# Patient Record
Sex: Female | Born: 1945
Health system: Southern US, Community
[De-identification: ages and names within clinical notes are randomized; demographics above are authoritative.]

## PROBLEM LIST (undated history)

## (undated) DIAGNOSIS — H269 Unspecified cataract: Secondary | ICD-10-CM

## (undated) DIAGNOSIS — K279 Peptic ulcer, site unspecified, unspecified as acute or chronic, without hemorrhage or perforation: Secondary | ICD-10-CM

## (undated) DIAGNOSIS — H40033 Anatomical narrow angle, bilateral: Secondary | ICD-10-CM

## (undated) DIAGNOSIS — H349 Unspecified retinal vascular occlusion: Secondary | ICD-10-CM

## (undated) DIAGNOSIS — I10 Essential (primary) hypertension: Secondary | ICD-10-CM

## (undated) HISTORY — DX: Anatomical narrow angle, bilateral: H40.033

## (undated) HISTORY — DX: Peptic ulcer, site unspecified, unspecified as acute or chronic, without hemorrhage or perforation: K27.9

## (undated) HISTORY — DX: Unspecified retinal vascular occlusion: H34.9

## (undated) HISTORY — DX: Unspecified cataract: H26.9

## (undated) HISTORY — PX: OTHER SURGICAL HISTORY: SHX169

---

## 2003-12-10 ENCOUNTER — Emergency Department (HOSPITAL_COMMUNITY): Admission: EM | Admit: 2003-12-10 | Discharge: 2003-12-10 | Payer: Self-pay | Admitting: Emergency Medicine

## 2003-12-12 ENCOUNTER — Emergency Department (HOSPITAL_COMMUNITY): Admission: EM | Admit: 2003-12-12 | Discharge: 2003-12-12 | Payer: Self-pay | Admitting: Emergency Medicine

## 2010-10-12 ENCOUNTER — Ambulatory Visit: Payer: BLUE CROSS/BLUE SHIELD | Attending: Internal Medicine | Admitting: Physical Therapy

## 2010-10-12 DIAGNOSIS — M542 Cervicalgia: Secondary | ICD-10-CM | POA: Insufficient documentation

## 2010-10-12 DIAGNOSIS — IMO0001 Reserved for inherently not codable concepts without codable children: Secondary | ICD-10-CM | POA: Insufficient documentation

## 2010-10-12 DIAGNOSIS — R293 Abnormal posture: Secondary | ICD-10-CM | POA: Insufficient documentation

## 2010-10-12 DIAGNOSIS — M2560 Stiffness of unspecified joint, not elsewhere classified: Secondary | ICD-10-CM | POA: Insufficient documentation

## 2010-10-14 ENCOUNTER — Ambulatory Visit: Payer: BLUE CROSS/BLUE SHIELD | Admitting: Physical Therapy

## 2010-10-19 ENCOUNTER — Ambulatory Visit: Payer: BLUE CROSS/BLUE SHIELD | Admitting: Physical Therapy

## 2010-10-21 ENCOUNTER — Ambulatory Visit: Payer: BLUE CROSS/BLUE SHIELD | Admitting: Physical Therapy

## 2010-10-26 ENCOUNTER — Ambulatory Visit: Payer: BLUE CROSS/BLUE SHIELD | Admitting: Physical Therapy

## 2010-10-28 ENCOUNTER — Ambulatory Visit: Payer: Medicare Other | Attending: Internal Medicine | Admitting: Physical Therapy

## 2010-10-28 DIAGNOSIS — M542 Cervicalgia: Secondary | ICD-10-CM | POA: Insufficient documentation

## 2010-10-28 DIAGNOSIS — IMO0001 Reserved for inherently not codable concepts without codable children: Secondary | ICD-10-CM | POA: Insufficient documentation

## 2010-10-28 DIAGNOSIS — M2560 Stiffness of unspecified joint, not elsewhere classified: Secondary | ICD-10-CM | POA: Insufficient documentation

## 2010-10-28 DIAGNOSIS — R293 Abnormal posture: Secondary | ICD-10-CM | POA: Insufficient documentation

## 2013-05-09 DIAGNOSIS — I110 Hypertensive heart disease with heart failure: Secondary | ICD-10-CM | POA: Diagnosis not present

## 2013-05-09 DIAGNOSIS — E7800 Pure hypercholesterolemia, unspecified: Secondary | ICD-10-CM | POA: Diagnosis not present

## 2013-05-14 DIAGNOSIS — I341 Nonrheumatic mitral (valve) prolapse: Secondary | ICD-10-CM | POA: Insufficient documentation

## 2013-06-04 DIAGNOSIS — H40059 Ocular hypertension, unspecified eye: Secondary | ICD-10-CM | POA: Diagnosis not present

## 2013-06-04 DIAGNOSIS — H40039 Anatomical narrow angle, unspecified eye: Secondary | ICD-10-CM | POA: Diagnosis not present

## 2013-10-04 DIAGNOSIS — H40059 Ocular hypertension, unspecified eye: Secondary | ICD-10-CM | POA: Diagnosis not present

## 2013-10-25 DIAGNOSIS — E785 Hyperlipidemia, unspecified: Secondary | ICD-10-CM | POA: Diagnosis not present

## 2013-10-25 DIAGNOSIS — Z79899 Other long term (current) drug therapy: Secondary | ICD-10-CM | POA: Diagnosis not present

## 2013-10-25 DIAGNOSIS — R7309 Other abnormal glucose: Secondary | ICD-10-CM | POA: Diagnosis not present

## 2013-10-30 DIAGNOSIS — F3289 Other specified depressive episodes: Secondary | ICD-10-CM | POA: Diagnosis not present

## 2013-10-30 DIAGNOSIS — I498 Other specified cardiac arrhythmias: Secondary | ICD-10-CM | POA: Diagnosis not present

## 2013-10-30 DIAGNOSIS — Z1212 Encounter for screening for malignant neoplasm of rectum: Secondary | ICD-10-CM | POA: Diagnosis not present

## 2013-10-30 DIAGNOSIS — I059 Rheumatic mitral valve disease, unspecified: Secondary | ICD-10-CM | POA: Diagnosis not present

## 2013-10-30 DIAGNOSIS — K449 Diaphragmatic hernia without obstruction or gangrene: Secondary | ICD-10-CM | POA: Diagnosis not present

## 2013-10-30 DIAGNOSIS — R5381 Other malaise: Secondary | ICD-10-CM | POA: Diagnosis not present

## 2013-10-30 DIAGNOSIS — R079 Chest pain, unspecified: Secondary | ICD-10-CM | POA: Diagnosis not present

## 2013-10-30 DIAGNOSIS — N6459 Other signs and symptoms in breast: Secondary | ICD-10-CM | POA: Diagnosis not present

## 2013-10-30 DIAGNOSIS — K219 Gastro-esophageal reflux disease without esophagitis: Secondary | ICD-10-CM | POA: Diagnosis not present

## 2013-10-30 DIAGNOSIS — I10 Essential (primary) hypertension: Secondary | ICD-10-CM | POA: Diagnosis not present

## 2013-10-30 DIAGNOSIS — R6884 Jaw pain: Secondary | ICD-10-CM | POA: Diagnosis not present

## 2013-10-30 DIAGNOSIS — H409 Unspecified glaucoma: Secondary | ICD-10-CM | POA: Diagnosis not present

## 2013-10-30 DIAGNOSIS — R61 Generalized hyperhidrosis: Secondary | ICD-10-CM | POA: Diagnosis not present

## 2013-10-30 DIAGNOSIS — F329 Major depressive disorder, single episode, unspecified: Secondary | ICD-10-CM | POA: Diagnosis not present

## 2013-10-30 DIAGNOSIS — M5030 Other cervical disc degeneration, unspecified cervical region: Secondary | ICD-10-CM | POA: Diagnosis not present

## 2013-10-30 DIAGNOSIS — Z0000 Encounter for general adult medical examination without abnormal findings: Secondary | ICD-10-CM | POA: Diagnosis not present

## 2013-10-30 DIAGNOSIS — J3089 Other allergic rhinitis: Secondary | ICD-10-CM | POA: Diagnosis not present

## 2013-10-30 DIAGNOSIS — R82998 Other abnormal findings in urine: Secondary | ICD-10-CM | POA: Diagnosis not present

## 2013-10-30 DIAGNOSIS — R319 Hematuria, unspecified: Secondary | ICD-10-CM | POA: Diagnosis not present

## 2013-11-26 DIAGNOSIS — N644 Mastodynia: Secondary | ICD-10-CM | POA: Diagnosis not present

## 2013-11-26 DIAGNOSIS — R920 Mammographic microcalcification found on diagnostic imaging of breast: Secondary | ICD-10-CM | POA: Diagnosis not present

## 2014-01-07 DIAGNOSIS — N814 Uterovaginal prolapse, unspecified: Secondary | ICD-10-CM | POA: Insufficient documentation

## 2014-01-07 DIAGNOSIS — R1031 Right lower quadrant pain: Secondary | ICD-10-CM | POA: Diagnosis not present

## 2014-01-11 DIAGNOSIS — R1031 Right lower quadrant pain: Secondary | ICD-10-CM | POA: Diagnosis not present

## 2014-01-31 DIAGNOSIS — E780 Pure hypercholesterolemia: Secondary | ICD-10-CM | POA: Diagnosis not present

## 2014-01-31 DIAGNOSIS — I119 Hypertensive heart disease without heart failure: Secondary | ICD-10-CM | POA: Diagnosis not present

## 2014-02-07 DIAGNOSIS — H2513 Age-related nuclear cataract, bilateral: Secondary | ICD-10-CM | POA: Diagnosis not present

## 2014-02-07 DIAGNOSIS — H25013 Cortical age-related cataract, bilateral: Secondary | ICD-10-CM | POA: Diagnosis not present

## 2014-02-07 DIAGNOSIS — H40033 Anatomical narrow angle, bilateral: Secondary | ICD-10-CM | POA: Diagnosis not present

## 2014-02-07 DIAGNOSIS — H40053 Ocular hypertension, bilateral: Secondary | ICD-10-CM | POA: Diagnosis not present

## 2014-02-18 DIAGNOSIS — I341 Nonrheumatic mitral (valve) prolapse: Secondary | ICD-10-CM | POA: Diagnosis not present

## 2014-02-18 DIAGNOSIS — R079 Chest pain, unspecified: Secondary | ICD-10-CM | POA: Diagnosis not present

## 2014-02-26 DIAGNOSIS — R0789 Other chest pain: Secondary | ICD-10-CM | POA: Diagnosis not present

## 2014-02-26 DIAGNOSIS — R079 Chest pain, unspecified: Secondary | ICD-10-CM | POA: Diagnosis not present

## 2014-02-26 DIAGNOSIS — R9439 Abnormal result of other cardiovascular function study: Secondary | ICD-10-CM | POA: Diagnosis not present

## 2014-03-12 DIAGNOSIS — R079 Chest pain, unspecified: Secondary | ICD-10-CM | POA: Diagnosis not present

## 2014-04-09 DIAGNOSIS — R079 Chest pain, unspecified: Secondary | ICD-10-CM | POA: Diagnosis not present

## 2014-06-13 DIAGNOSIS — H40053 Ocular hypertension, bilateral: Secondary | ICD-10-CM | POA: Diagnosis not present

## 2014-06-13 DIAGNOSIS — H40033 Anatomical narrow angle, bilateral: Secondary | ICD-10-CM | POA: Diagnosis not present

## 2014-06-21 ENCOUNTER — Encounter (HOSPITAL_COMMUNITY): Payer: Self-pay | Admitting: *Deleted

## 2014-06-21 ENCOUNTER — Emergency Department (HOSPITAL_COMMUNITY)
Admission: EM | Admit: 2014-06-21 | Discharge: 2014-06-21 | Disposition: A | Discharge status: Home / Self Care | Payer: Medicare Other | Attending: Emergency Medicine | Admitting: Emergency Medicine

## 2014-06-21 DIAGNOSIS — I10 Essential (primary) hypertension: Secondary | ICD-10-CM | POA: Insufficient documentation

## 2014-06-21 DIAGNOSIS — Z9889 Other specified postprocedural states: Secondary | ICD-10-CM | POA: Insufficient documentation

## 2014-06-21 DIAGNOSIS — R1013 Epigastric pain: Secondary | ICD-10-CM | POA: Diagnosis not present

## 2014-06-21 DIAGNOSIS — Z720 Tobacco use: Secondary | ICD-10-CM | POA: Diagnosis not present

## 2014-06-21 DIAGNOSIS — M549 Dorsalgia, unspecified: Secondary | ICD-10-CM | POA: Diagnosis not present

## 2014-06-21 HISTORY — DX: Essential (primary) hypertension: I10

## 2014-06-21 LAB — URINALYSIS, ROUTINE W REFLEX MICROSCOPIC
BILIRUBIN URINE: NEGATIVE
GLUCOSE, UA: NEGATIVE mg/dL
HGB URINE DIPSTICK: NEGATIVE
Ketones, ur: NEGATIVE mg/dL
Nitrite: NEGATIVE
PROTEIN: NEGATIVE mg/dL
Specific Gravity, Urine: 1.02 (ref 1.005–1.030)
Urobilinogen, UA: 0.2 mg/dL (ref 0.0–1.0)
pH: 5.5 (ref 5.0–8.0)

## 2014-06-21 LAB — CBC WITH DIFFERENTIAL/PLATELET
Basophils Absolute: 0.1 10*3/uL (ref 0.0–0.1)
Basophils Relative: 0 % (ref 0–1)
Eosinophils Absolute: 0.2 10*3/uL (ref 0.0–0.7)
Eosinophils Relative: 1 % (ref 0–5)
HCT: 39.6 % (ref 36.0–46.0)
Hemoglobin: 13.5 g/dL (ref 12.0–15.0)
Lymphocytes Relative: 24 % (ref 12–46)
Lymphs Abs: 3.4 10*3/uL (ref 0.7–4.0)
MCH: 31.8 pg (ref 26.0–34.0)
MCHC: 34.1 g/dL (ref 30.0–36.0)
MCV: 93.2 fL (ref 78.0–100.0)
Monocytes Absolute: 0.8 10*3/uL (ref 0.1–1.0)
Monocytes Relative: 6 % (ref 3–12)
Neutro Abs: 9.8 10*3/uL — ABNORMAL HIGH (ref 1.7–7.7)
Neutrophils Relative %: 69 % (ref 43–77)
Platelets: 414 10*3/uL — ABNORMAL HIGH (ref 150–400)
RBC: 4.25 MIL/uL (ref 3.87–5.11)
RDW: 13 % (ref 11.5–15.5)
WBC: 14.3 10*3/uL — ABNORMAL HIGH (ref 4.0–10.5)

## 2014-06-21 LAB — COMPREHENSIVE METABOLIC PANEL
ALT: 23 U/L (ref 0–35)
AST: 21 U/L (ref 0–37)
Albumin: 4.2 g/dL (ref 3.5–5.2)
Alkaline Phosphatase: 69 U/L (ref 39–117)
Anion gap: 9 (ref 5–15)
BUN: 14 mg/dL (ref 6–23)
CO2: 23 mmol/L (ref 19–32)
Calcium: 9.9 mg/dL (ref 8.4–10.5)
Chloride: 103 mmol/L (ref 96–112)
Creatinine, Ser: 0.7 mg/dL (ref 0.50–1.10)
GFR calc Af Amer: >90 mL/min (ref >90)
GFR calc non Af Amer: 87 mL/min — ABNORMAL LOW (ref >90)
Glucose, Bld: 112 mg/dL — ABNORMAL HIGH (ref 70–99)
Potassium: 4 mmol/L (ref 3.5–5.1)
Sodium: 135 mmol/L (ref 135–145)
Total Bilirubin: 0.3 mg/dL (ref 0.3–1.2)
Total Protein: 7.6 g/dL (ref 6.0–8.3)

## 2014-06-21 LAB — URINE MICROSCOPIC-ADD ON

## 2014-06-21 LAB — LIPASE, BLOOD: Lipase: 41 U/L (ref 11–59)

## 2014-06-21 MED ORDER — FAMOTIDINE IN NACL 20-0.9 MG/50ML-% IV SOLN
20.0000 mg | Freq: Once | INTRAVENOUS | Status: AC
  Administered 2014-06-21: 20 mg via INTRAVENOUS
  Filled 2014-06-21: qty 50

## 2014-06-21 MED ORDER — PANTOPRAZOLE SODIUM 40 MG PO TBEC
40.0000 mg | DELAYED_RELEASE_TABLET | Freq: Once | ORAL | Status: AC
  Administered 2014-06-21: 40 mg via ORAL
  Filled 2014-06-21: qty 1

## 2014-06-21 MED ORDER — SUCRALFATE 1 G PO TABS: 1.0000 g | ORAL_TABLET | Freq: Three times a day (TID) | ORAL | Status: DC

## 2014-06-21 MED ORDER — GI COCKTAIL ~~LOC~~
30.0000 mL | Freq: Once | ORAL | Status: AC
  Administered 2014-06-21: 30 mL via ORAL
  Filled 2014-06-21: qty 30

## 2014-06-21 NOTE — ED Provider Notes (Signed)
CSN: [id number]     Arrival date & time 06/21/14  1746 History  This chart was scribed for Virgel Manifold, MD by Delphia Grates, ED Scribe. This patient was seen in room APA05/APA05 and the patient's care was started at 6:34 PM.   Chief Complaint  Patient presents with  . Abdominal Pain    The history is provided by the patient. No language interpreter was used.     HPI Comments: Lacey Christensen is a 69 y.o. female who presents to the Emergency Department complaining of constant, waxing and waning, burning, epigastric abdominal pain with radiation to the back and left side since last night. Patient states she was unable to get comfortable last night and reports the pain kept her awake. She reports mild relief after eating oatmeal this morning, but denies any aggravating factors. Patient states she typically takes Nexium for abdominal pain, but this was substituted for omeprazole yesterday. She has also tried Gaviscon. She denies nausea, diarrhea, constipation, or SOB.    Past Medical History  Diagnosis Date  . Hypertension    Past Surgical History  Procedure Laterality Date  . Dilitation and curritage     History reviewed. No pertinent family history. History  Substance Use Topics  . Smoking status: Current Every Day Smoker  . Smokeless tobacco: Not on file  . Alcohol Use: No   OB History    No data available     Review of Systems  Gastrointestinal: Positive for abdominal pain.  Musculoskeletal: Positive for back pain.  All other systems reviewed and are negative.     Allergies  Sulfa antibiotics  Home Medications   Prior to Admission medications   Not on File   Triage Vitals: BP 142/74 mmHg  Pulse 88  Temp(Src) 98.4 F (36.9 C) (Oral)  Resp 15  Ht 5\' 5"  (1.651 m)  Wt 145 lb (65.772 kg)  BMI 24.13 kg/m2  SpO2 98%  Physical Exam  Constitutional: She appears well-developed and well-nourished. No distress.  HENT:  Head: Normocephalic and atraumatic.  Eyes:  Conjunctivae are normal. Right eye exhibits no discharge. Left eye exhibits no discharge.  Neck: Neck supple.  Cardiovascular: Normal rate and regular rhythm.  Exam reveals no gallop and no friction rub.   No murmur heard. Pulmonary/Chest: Effort normal and breath sounds normal. No respiratory distress.  Abdominal: Soft. She exhibits no distension. There is tenderness in the epigastric area. There is no rebound and no guarding.  Mild tenderness in epigastrium.  Musculoskeletal: She exhibits no edema or tenderness.  Neurological: She is alert.  Skin: Skin is warm and dry.  Psychiatric: She has a normal mood and affect. Her behavior is normal. Thought content normal.  Nursing note and vitals reviewed.   ED Course  Procedures (including critical care time)  DIAGNOSTIC STUDIES: Oxygen Saturation is 98% on room air, normal by my interpretation.    COORDINATION OF CARE: At [place] Discussed treatment plan with patient which includes labs. Patient agrees.   Labs Review Labs Reviewed  URINALYSIS, ROUTINE W REFLEX MICROSCOPIC - Abnormal; Notable for the following:    Leukocytes, UA MODERATE (*)    All other components within normal limits  CBC WITH DIFFERENTIAL/PLATELET - Abnormal; Notable for the following:    WBC 14.3 (*)    Platelets 414 (*)    Neutro Abs 9.8 (*)    All other components within normal limits  COMPREHENSIVE METABOLIC PANEL - Abnormal; Notable for the following:    Glucose, Bld 112 (*)  GFR calc non Af Amer 87 (*)    All other components within normal limits  URINE MICROSCOPIC-ADD ON - Abnormal; Notable for the following:    Squamous Epithelial / LPF FEW (*)    Bacteria, UA MANY (*)    All other components within normal limits  LIPASE, BLOOD    Imaging Review No results found.   EKG Interpretation None      MDM   Final diagnoses:  Epigastric pain    68yF with epigastric pain. Minimal tenderness on exam. Possible PUD or gastritis. Low suspicion for more  serious etiology.  I personally preformed the services scribed in my presence. The recorded information has been reviewed is accurate. Virgel Manifold, MD.      Virgel Manifold, MD 06/26/14 [phone]

## 2014-06-21 NOTE — ED Notes (Signed)
Upper abd pain, and back pain, since last night, NO NVD.

## 2014-07-13 ENCOUNTER — Telehealth: Payer: Self-pay | Admitting: Family Medicine

## 2014-07-17 ENCOUNTER — Telehealth: Payer: Self-pay | Admitting: Family Medicine

## 2014-07-19 NOTE — Telephone Encounter (Signed)
Appointment given for 5/5 with stacks.

## 2014-07-19 NOTE — Telephone Encounter (Signed)
Patient has medicare and [employer] [place]. Patient is currently on quinopril, verapamil, tinomol eye drops, alprazolam. Appointment scheduled for May 5th @ 1:55.

## 2014-08-01 ENCOUNTER — Encounter: Payer: Self-pay | Admitting: Family Medicine

## 2014-08-01 ENCOUNTER — Ambulatory Visit (INDEPENDENT_AMBULATORY_CARE_PROVIDER_SITE_OTHER): Payer: Medicare Other | Admitting: Family Medicine

## 2014-08-01 ENCOUNTER — Encounter (INDEPENDENT_AMBULATORY_CARE_PROVIDER_SITE_OTHER): Payer: Self-pay

## 2014-08-01 VITALS — BP 132/76 | HR 79 | Temp 97.8°F | Ht 63.5 in | Wt 145.6 lb

## 2014-08-01 DIAGNOSIS — F411 Generalized anxiety disorder: Secondary | ICD-10-CM

## 2014-08-01 DIAGNOSIS — E785 Hyperlipidemia, unspecified: Secondary | ICD-10-CM

## 2014-08-01 DIAGNOSIS — H409 Unspecified glaucoma: Secondary | ICD-10-CM

## 2014-08-01 DIAGNOSIS — R202 Paresthesia of skin: Secondary | ICD-10-CM

## 2014-08-01 DIAGNOSIS — I10 Essential (primary) hypertension: Secondary | ICD-10-CM

## 2014-08-01 MED ORDER — ALPRAZOLAM 0.25 MG PO TABS: 0.1250 mg | ORAL_TABLET | Freq: Three times a day (TID) | ORAL | Status: DC | PRN

## 2014-08-01 NOTE — Progress Notes (Signed)
Subjective:  Patient ID: Lacey Christensen, female    DOB: 1945-06-20  Age: 69 y.o. MRN: [id number]  CC: Establish Care   HPI Lacey Christensen presents for new patient in today for establishing care here at this office she was a patient of Dr. Earnie Larsson story until he retired earlier this year. The patient has been treated long term for high blood pressure and anxiety. She's had borderline cholesterol elevation as well.   follow-up of hypertension. Patient has no history of headache chest pain or shortness of breath or recent cough. Patient also denies symptoms of TIA such as numbness weakness lateralizing. Patient checks  blood pressure at home and has not had any elevated readings recently. Patient denies side effects from his medication. States taking it regularly.   Patient in for follow-up of elevated cholesterol. Doing well without complaints on current medication. Denies side effects of Krill oil including abdominal discomfort and nausea. Also in today for liver function testing. Currently no chest pain, shortness of breath or other cardiovascular related symptoms noted.  Her past history is negative for heart disease stroke cancer etc.  History O[place]an has a past medical history of Hypertension.   She has past surgical history that includes dilitation and curritage.   Her family history includes Diabetes in her mother; Hypertension in her father and mother.She reports that she has been smoking.  She does not have any smokeless tobacco history on file. She reports that she does not drink alcohol. Her drug history is not on file.  Current Outpatient Prescriptions on File Prior to Visit  Medication Sig Dispense Refill  . Aspirin-Acetaminophen-Caffeine (GOODY HEADACHE PO) Take 1 packet by mouth 4 (four) times daily as needed (for pain).    . Calcium Carbonate-Vitamin D (CALCARB 600/D PO) Take 1 tablet by mouth every evening.    . diphenhydrAMINE (BENADRYL) 25 mg capsule Take 25 mg by mouth every 6  (six) hours as needed for itching or allergies.    Marland Kitchen KRILL OIL PO Take 1 capsule by mouth every evening.    . Multiple Vitamin (MULTIVITAMIN WITH MINERALS) TABS tablet Take 1 tablet by mouth daily.    . quinapril (ACCUPRIL) 40 MG tablet Take 40 mg by mouth every morning.    . timolol (BETIMOL) 0.25 % ophthalmic solution Place 1 drop into both eyes daily.    . verapamil (CALAN-SR) 240 MG CR tablet Take 240 mg by mouth every morning.     No current facility-administered medications on file prior to visit.    ROS Review of Systems  Constitutional: Negative for fever, chills, diaphoresis, appetite change, fatigue and unexpected weight change.  HENT: Negative for congestion, ear pain, hearing loss, postnasal drip, rhinorrhea, sneezing, sore throat and trouble swallowing.   Eyes: Negative for pain.  Respiratory: Negative for cough, chest tightness and shortness of breath.   Cardiovascular: Negative for chest pain and palpitations.  Gastrointestinal: Positive for abdominal pain (3 months ago she was at the emergency room for epigastric pain and vomiting. At that time she resolved most of her symptoms but continued to have some mild indigestion. That seems to fade with 2 near normal but she does continue to take a Nexium tablet hal). Negative for nausea, vomiting, diarrhea and constipation.  Genitourinary: Negative for dysuria, frequency and menstrual problem.  Musculoskeletal: Negative for joint swelling and arthralgias.  Skin: Negative for rash.  Neurological: Negative for dizziness, weakness, numbness and headaches.       Over the last 3 weeks she's noticed  a burning sensation in the tongue periodically mild to moderate but annoying. Not related to food. She has had some minimal tingling in the toes as well.  Psychiatric/Behavioral: Negative for dysphoric mood and agitation.    Objective:  BP 132/76 mmHg  Pulse 79  Temp(Src) 97.8 F (36.6 C) (Oral)  Ht 5' 3.5" (1.613 m)  Wt 145 lb 9.6 oz  (66.044 kg)  BMI 25.38 kg/m2  Physical Exam  Constitutional: She is oriented to person, place, and time. She appears well-developed and well-nourished. No distress.  HENT:  Head: Normocephalic and atraumatic.  Right Ear: External ear normal.  Left Ear: External ear normal.  Nose: Nose normal.  Mouth/Throat: Oropharynx is clear and moist.  Eyes: Conjunctivae and EOM are normal. Pupils are equal, round, and reactive to light.  Neck: Normal range of motion. Neck supple. No thyromegaly present.  Cardiovascular: Normal rate, regular rhythm and normal heart sounds.   No murmur heard. Pulmonary/Chest: Effort normal and breath sounds normal. No respiratory distress. She has no wheezes. She has no rales.  Abdominal: Soft. Bowel sounds are normal. She exhibits no distension. There is no tenderness.  Lymphadenopathy:    She has no cervical adenopathy.  Neurological: She is alert and oriented to person, place, and time. She has normal reflexes.  Skin: Skin is warm and dry.  Psychiatric: She has a normal mood and affect. Her behavior is normal. Judgment and thought content normal.    Assessment & Plan:   Shevelle was seen today for establish care.  Diagnoses and all orders for this visit:  Hyperlipidemia Orders: -     POCT CBC; Standing -     CMP14+EGFR; Standing -     Lipid panel; Standing -     NMR, lipoprofile; Standing -     Thyroid Panel With TSH; Standing -     Vit D  25 hydroxy (rtn osteoporosis monitoring); Standing  Essential hypertension Orders: -     POCT CBC; Standing -     CMP14+EGFR; Standing -     Thyroid Panel With TSH; Standing -     Vit D  25 hydroxy (rtn osteoporosis monitoring); Standing  Generalized anxiety disorder Orders: -     POCT CBC; Standing -     CMP14+EGFR; Standing -     Thyroid Panel With TSH; Standing -     Vit D  25 hydroxy (rtn osteoporosis monitoring); Standing  Glaucoma Comments: narrow angle Orders: -     POCT CBC; Standing -      CMP14+EGFR; Standing -     Thyroid Panel With TSH; Standing -     Vit D  25 hydroxy (rtn osteoporosis monitoring); Standing  Paresthesia Orders: -     Vitamin B12 -     Folate  Other orders -     ALPRAZolam (XANAX) 0.25 MG tablet; Take 0.5-1 tablets (0.125-0.25 mg total) by mouth 3 (three) times daily as needed for anxiety.   I have discontinued Ms. Sloane's sucralfate. I have also changed her ALPRAZolam. Additionally, I am having her maintain her quinapril, verapamil, timolol, KRILL OIL PO, Calcium Carbonate-Vitamin D (CALCARB 600/D PO), multivitamin with minerals, Aspirin-Acetaminophen-Caffeine (GOODY HEADACHE PO), and diphenhydrAMINE.  Meds ordered this encounter  Medications  . ALPRAZolam (XANAX) 0.25 MG tablet    Sig: Take 0.5-1 tablets (0.125-0.25 mg total) by mouth 3 (three) times daily as needed for anxiety.    Dispense:  90 tablet    Refill:  5     Follow-up:  Return in about 6 months (around 02/01/2015).  Claretta Fraise, M.D.

## 2014-08-02 ENCOUNTER — Other Ambulatory Visit: Payer: Medicare Other

## 2014-08-02 DIAGNOSIS — R202 Paresthesia of skin: Secondary | ICD-10-CM | POA: Diagnosis not present

## 2014-08-03 LAB — VITAMIN B12: Vitamin B-12: 374 pg/mL (ref 211–946)

## 2014-08-03 LAB — FOLATE: FOLATE: 19.6 ng/mL (ref >3.0)

## 2014-09-17 ENCOUNTER — Other Ambulatory Visit (INDEPENDENT_AMBULATORY_CARE_PROVIDER_SITE_OTHER): Payer: Medicare Other

## 2014-09-17 DIAGNOSIS — E559 Vitamin D deficiency, unspecified: Secondary | ICD-10-CM | POA: Diagnosis not present

## 2014-09-17 DIAGNOSIS — H409 Unspecified glaucoma: Secondary | ICD-10-CM | POA: Diagnosis not present

## 2014-09-17 DIAGNOSIS — F411 Generalized anxiety disorder: Secondary | ICD-10-CM

## 2014-09-17 DIAGNOSIS — I10 Essential (primary) hypertension: Secondary | ICD-10-CM | POA: Diagnosis not present

## 2014-09-17 DIAGNOSIS — E785 Hyperlipidemia, unspecified: Secondary | ICD-10-CM | POA: Diagnosis not present

## 2014-09-17 LAB — POCT CBC
GRANULOCYTE PERCENT: 70.1 % (ref 37–80)
HCT, POC: 43.4 % (ref 37.7–47.9)
Hemoglobin: 14 g/dL (ref 12.2–16.2)
LYMPH, POC: 2.4 (ref 0.6–3.4)
MCH: 30.3 pg (ref 27–31.2)
MCHC: 32.1 g/dL (ref 31.8–35.4)
MCV: 94.4 fL (ref 80–97)
MPV: 7.2 fL (ref 0–99.8)
POC Granulocyte: 7.6 — AB (ref 2–6.9)
POC LYMPH %: 22.6 % (ref 10–50)
Platelet Count, POC: 448 10*3/uL — AB (ref 142–424)
RBC: 4.6 M/uL (ref 4.04–5.48)
RDW, POC: 13.4 %
WBC: 10.8 10*3/uL — AB (ref 4.6–10.2)

## 2014-09-17 NOTE — Progress Notes (Signed)
Lab only 

## 2014-09-18 LAB — THYROID PANEL WITH TSH
FREE THYROXINE INDEX: 1.4 (ref 1.2–4.9)
T3 Uptake Ratio: 22 % — ABNORMAL LOW (ref 24–39)
T4 TOTAL: 6.5 ug/dL (ref 4.5–12.0)
TSH: 3.77 u[IU]/mL (ref 0.450–4.500)

## 2014-09-18 LAB — NMR, LIPOPROFILE
CHOLESTEROL: 194 mg/dL (ref 100–199)
HDL CHOLESTEROL BY NMR: 35 mg/dL — AB (ref >39)
HDL PARTICLE NUMBER: 29.5 umol/L — AB (ref >=30.5)
LDL Particle Number: 1434 nmol/L — ABNORMAL HIGH (ref <1000)
LDL Size: 19.8 nm (ref >20.5)
LDL-C: 98 mg/dL (ref 0–99)
LP-IR Score: 71 — ABNORMAL HIGH (ref <=45)
SMALL LDL PARTICLE NUMBER: 1096 nmol/L — AB (ref <=527)
TRIGLYCERIDES BY NMR: 307 mg/dL — AB (ref 0–149)

## 2014-09-18 LAB — CMP14+EGFR
A/G RATIO: 1.6 (ref 1.1–2.5)
ALK PHOS: 76 IU/L (ref 39–117)
ALT: 22 IU/L (ref 0–32)
AST: 22 IU/L (ref 0–40)
Albumin: 4.4 g/dL (ref 3.6–4.8)
BUN/Creatinine Ratio: 18 (ref 11–26)
BUN: 12 mg/dL (ref 8–27)
CHLORIDE: 104 mmol/L (ref 97–108)
CO2: 22 mmol/L (ref 18–29)
Calcium: 10.3 mg/dL (ref 8.7–10.3)
Creatinine, Ser: 0.65 mg/dL (ref 0.57–1.00)
GFR calc non Af Amer: 92 mL/min/{1.73_m2} (ref >59)
GFR, EST AFRICAN AMERICAN: 105 mL/min/{1.73_m2} (ref >59)
GLUCOSE: 94 mg/dL (ref 65–99)
Globulin, Total: 2.7 g/dL (ref 1.5–4.5)
POTASSIUM: 5.4 mmol/L — AB (ref 3.5–5.2)
SODIUM: 145 mmol/L — AB (ref 134–144)
TOTAL PROTEIN: 7.1 g/dL (ref 6.0–8.5)

## 2014-09-18 LAB — VITAMIN D 25 HYDROXY (VIT D DEFICIENCY, FRACTURES): Vit D, 25-Hydroxy: 30.9 ng/mL (ref 30.0–100.0)

## 2014-09-23 ENCOUNTER — Emergency Department (HOSPITAL_COMMUNITY)
Admission: EM | Admit: 2014-09-23 | Discharge: 2014-09-23 | Disposition: A | Discharge status: Home / Self Care | Payer: Medicare Other | Attending: Emergency Medicine | Admitting: Emergency Medicine

## 2014-09-23 ENCOUNTER — Emergency Department (HOSPITAL_COMMUNITY): Payer: Medicare Other

## 2014-09-23 ENCOUNTER — Encounter (HOSPITAL_COMMUNITY): Payer: Self-pay | Admitting: Emergency Medicine

## 2014-09-23 ENCOUNTER — Telehealth: Payer: Self-pay | Admitting: Gastroenterology

## 2014-09-23 DIAGNOSIS — R1013 Epigastric pain: Secondary | ICD-10-CM | POA: Diagnosis present

## 2014-09-23 DIAGNOSIS — K2980 Duodenitis without bleeding: Secondary | ICD-10-CM | POA: Diagnosis not present

## 2014-09-23 DIAGNOSIS — I10 Essential (primary) hypertension: Secondary | ICD-10-CM | POA: Insufficient documentation

## 2014-09-23 DIAGNOSIS — R109 Unspecified abdominal pain: Secondary | ICD-10-CM

## 2014-09-23 DIAGNOSIS — K3189 Other diseases of stomach and duodenum: Secondary | ICD-10-CM | POA: Diagnosis not present

## 2014-09-23 DIAGNOSIS — Z79899 Other long term (current) drug therapy: Secondary | ICD-10-CM | POA: Insufficient documentation

## 2014-09-23 DIAGNOSIS — N200 Calculus of kidney: Secondary | ICD-10-CM | POA: Diagnosis not present

## 2014-09-23 DIAGNOSIS — R197 Diarrhea, unspecified: Secondary | ICD-10-CM | POA: Insufficient documentation

## 2014-09-23 DIAGNOSIS — Z720 Tobacco use: Secondary | ICD-10-CM | POA: Diagnosis not present

## 2014-09-23 LAB — COMPREHENSIVE METABOLIC PANEL
ALT: 19 U/L (ref 14–54)
ANION GAP: 9 (ref 5–15)
AST: 18 U/L (ref 15–41)
Albumin: 4.1 g/dL (ref 3.5–5.0)
Alkaline Phosphatase: 66 U/L (ref 38–126)
BUN: 11 mg/dL (ref 6–20)
CALCIUM: 9.8 mg/dL (ref 8.9–10.3)
CO2: 27 mmol/L (ref 22–32)
CREATININE: 0.76 mg/dL (ref 0.44–1.00)
Chloride: 103 mmol/L (ref 101–111)
GLUCOSE: 115 mg/dL — AB (ref 65–99)
Potassium: 4.7 mmol/L (ref 3.5–5.1)
Sodium: 139 mmol/L (ref 135–145)
TOTAL PROTEIN: 7.8 g/dL (ref 6.5–8.1)
Total Bilirubin: 0.5 mg/dL (ref 0.3–1.2)

## 2014-09-23 LAB — CBC WITH DIFFERENTIAL/PLATELET
BASOS ABS: 0.1 10*3/uL (ref 0.0–0.1)
Basophils Relative: 1 % (ref 0–1)
EOS ABS: 0.3 10*3/uL (ref 0.0–0.7)
Eosinophils Relative: 2 % (ref 0–5)
HCT: 40.4 % (ref 36.0–46.0)
Hemoglobin: 13.5 g/dL (ref 12.0–15.0)
LYMPHS ABS: 2.5 10*3/uL (ref 0.7–4.0)
LYMPHS PCT: 18 % (ref 12–46)
MCH: 31.6 pg (ref 26.0–34.0)
MCHC: 33.4 g/dL (ref 30.0–36.0)
MCV: 94.6 fL (ref 78.0–100.0)
Monocytes Absolute: 0.9 10*3/uL (ref 0.1–1.0)
Monocytes Relative: 7 % (ref 3–12)
NEUTROS PCT: 72 % (ref 43–77)
Neutro Abs: 10 10*3/uL — ABNORMAL HIGH (ref 1.7–7.7)
PLATELETS: 438 10*3/uL — AB (ref 150–400)
RBC: 4.27 MIL/uL (ref 3.87–5.11)
RDW: 12.9 % (ref 11.5–15.5)
WBC: 13.7 10*3/uL — AB (ref 4.0–10.5)

## 2014-09-23 LAB — URINALYSIS, ROUTINE W REFLEX MICROSCOPIC
BILIRUBIN URINE: NEGATIVE
Glucose, UA: NEGATIVE mg/dL
HGB URINE DIPSTICK: NEGATIVE
KETONES UR: NEGATIVE mg/dL
NITRITE: NEGATIVE
Protein, ur: NEGATIVE mg/dL
SPECIFIC GRAVITY, URINE: 1.01 (ref 1.005–1.030)
UROBILINOGEN UA: 0.2 mg/dL (ref 0.0–1.0)
pH: 7 (ref 5.0–8.0)

## 2014-09-23 LAB — LIPASE, BLOOD: LIPASE: 48 U/L (ref 22–51)

## 2014-09-23 LAB — POC OCCULT BLOOD, ED: FECAL OCCULT BLD: POSITIVE — AB

## 2014-09-23 LAB — URINE MICROSCOPIC-ADD ON

## 2014-09-23 MED ORDER — ONDANSETRON HCL 4 MG/2ML IJ SOLN
4.0000 mg | Freq: Once | INTRAMUSCULAR | Status: AC
  Administered 2014-09-23: 4 mg via INTRAVENOUS
  Filled 2014-09-23: qty 2

## 2014-09-23 MED ORDER — IOHEXOL 300 MG/ML  SOLN
100.0000 mL | Freq: Once | INTRAMUSCULAR | Status: AC | PRN
  Administered 2014-09-23: 100 mL via INTRAVENOUS

## 2014-09-23 MED ORDER — SUCRALFATE 1 GM/10ML PO SUSP: 1.0000 g | Freq: Three times a day (TID) | ORAL | Status: DC

## 2014-09-23 MED ORDER — MORPHINE SULFATE 4 MG/ML IJ SOLN
4.0000 mg | Freq: Once | INTRAMUSCULAR | Status: AC
  Administered 2014-09-23: 4 mg via INTRAVENOUS
  Filled 2014-09-23: qty 1

## 2014-09-23 MED ORDER — IOHEXOL 300 MG/ML  SOLN
25.0000 mL | Freq: Once | INTRAMUSCULAR | Status: AC | PRN
  Administered 2014-09-23: 25 mL via ORAL

## 2014-09-23 MED ORDER — FAMOTIDINE IN NACL 20-0.9 MG/50ML-% IV SOLN: 20.0000 mg | Freq: Once | INTRAVENOUS | Status: DC

## 2014-09-23 MED ORDER — ESOMEPRAZOLE MAGNESIUM 40 MG PO CPDR: 40.0000 mg | DELAYED_RELEASE_CAPSULE | Freq: Two times a day (BID) | ORAL | Status: DC

## 2014-09-23 MED ORDER — ONDANSETRON HCL 4 MG/2ML IJ SOLN: 4.0000 mg | Freq: Once | INTRAMUSCULAR | Status: DC

## 2014-09-23 MED ORDER — FAMOTIDINE 20 MG PO TABS
20.0000 mg | ORAL_TABLET | Freq: Once | ORAL | Status: AC
  Administered 2014-09-23: 20 mg via ORAL
  Filled 2014-09-23: qty 1

## 2014-09-23 MED ORDER — PANTOPRAZOLE SODIUM 40 MG IV SOLR
40.0000 mg | Freq: Once | INTRAVENOUS | Status: AC
  Administered 2014-09-23: 40 mg via INTRAVENOUS
  Filled 2014-09-23: qty 40

## 2014-09-23 NOTE — ED Notes (Signed)
Pt alert & oriented x4, stable gait. Patient given discharge instructions, paperwork & prescription(s). Patient  instructed to stop at the registration desk to finish any additional paperwork. Patient verbalized understanding. Pt left department w/ no further questions. 

## 2014-09-23 NOTE — ED Notes (Addendum)
Pt reports generalized abdominal pain since Friday. Pt reports nausea,urinary frequency, and diarrhea since yesterday.

## 2014-09-23 NOTE — Telephone Encounter (Signed)
PLEASE CALL PT. CONTACT PT. SHE NEEDS OPV WITHIN  THE NEXT 2-3 WEEKS. SHE SHOULD AVOID ASA/NSAIDS, SPECIFICALLY GOODY POWDERS FOR 2 WEEKS. CONTINUE NEXIUM BID.

## 2014-09-23 NOTE — Discharge Instructions (Signed)
Duodenitis Duodenitis is inflammation of the lining of the first part of your small intestine (duodenum). There are two types of duodenitis:  Acute duodenitis (develops suddenly and is short lived).   Chronic duodenitis (develops over an extended period and lasts months to years). CAUSES  Duodenitis is most often caused by infection with the bacterium Helicobacter pylori (H. pylori). H. pylori increases the production of stomach acid and causes changes in the environment of the duodenum. This irritates and damages the cells of the duodenum causing inflammation. Other causes of duodenitis include:   Long-term use of nonsteroidal anti-inflammatory drugs (NSAIDs). NSAIDs change the lining of the duodenum and make it more prone to injury from stomach acid.  Excessive use of alcohol. Alcohol increases stomach acid and changes the lining of the duodenum which makes it more likely for inflammation to develop.  Giardiasis. Giardiasis is a common infection of the small intestine. It can cause inflammation of the duodenum.   Other gastrointestinal disorders, such as Crohn disease. People with these disorders are more likely to develop duodenitis. SYMPTOMS  Although duodenitis does not always cause symptoms, symptoms that do occur include:  Nausea or vomiting.  Gassy, bloated feeling or an uncomfortable feeling of fullness after eating.  Burning, cramps, or pain in the upper abdominal area. DIAGNOSIS  To diagnose duodenitis, your health care provider may use results from:   An exam of the duodenum using a thin tube with a tiny camera on the tip, which is placed down your throat (endoscope). The endoscope is passed through your stomach and into your duodenum. Sometimes a sample of tissue from your duodenum is removed with the endoscope. The sample is then examined under a microscope (biopsy) for signs of inflammation and H. pylori infection.   Tests that check samples of your blood or stool for  H. pylori infection.   A test that checks the gases in a sample of your expired breath for H. pylori infection. The test measures the levels of carbon dioxide in your breath after you drink a special solution.  An X-ray exam using a special liquid that you swallow to illuminate your digestive tract (barium) to show signs of inflammation. TREATMENT  Treatment will depend on the cause of the duodenitis. The most common treatments include:  Use of medication to treat infection.  Medication to reduce stomach acid.  Discontinuing the use of NSAIDs.  Management of other gastrointestinal conditions.  Avoiding alcohol consumption. Additionally, taking the following steps can help to reduce the severity of your symptoms:  Drink enough water to keep your urine clear or pale yellow.  Avoid consuming these foods or drinks:  Caffeinated drinks.  Chocolate.  Peppermint or mint-flavored food or drinks.  Garlic.  Onions.  Spicy foods.  Citrus fruits, such as oranges, lemons, or limes.  Foods that use tomato-based sauces, such as pasta sauce, chili, salsa, and pizza.  Fatty foods.  Fried foods. Document Released: 07/10/2012 Document Revised: 07/30/2013 Document Reviewed: 07/10/2012 [facility] Patient Information 2015 [place], [place]. This information is not intended to replace advice given to you by your health care provider. Make sure you discuss any questions you have with your health care provider.   Take the medicines as prescribed and do not take any more BC or Goody Powders.  The only safe medicine for pain relief to avoid stomach ulcer complications is tylenol.

## 2014-09-23 NOTE — ED Notes (Signed)
Patient is resting comfortably. 

## 2014-09-23 NOTE — ED Provider Notes (Signed)
CSN: [id number]     Arrival date & time 09/23/14  9518 History   First MD Initiated Contact with Patient 09/23/14 0845     Chief Complaint  Patient presents with  . Abdominal Pain     (Consider location/radiation/quality/duration/timing/severity/associated sxs/prior Treatment) The history is provided by the patient and the spouse.   Lacey Christensen is a 70 y.o. female presenting with a history of hypertension and peptic ulcer disease which was treated 13 years ago presenting with a 3 day history of epigastric pain which has been constant, nausea without emesis and several episodes of watery nonbloody diarrhea which started yesterday.  She's had no fevers or chills.  She also denies weakness or lightheadedness.  Her pain is constant and is briefly relieved by Mylanta.  She has had reduced appetite and states sometimes eating makes it better sometimes it makes it worse.  Her husband at the bedside as that she takes at least 4 BC powders daily for chronic arthritis pain.    Past Medical History  Diagnosis Date  . Hypertension    Past Surgical History  Procedure Laterality Date  . Dilitation and curritage     Family History  Problem Relation Age of Onset  . Diabetes Mother   . Hypertension Mother   . Hypertension Father    History  Substance Use Topics  . Smoking status: Current Every Day Smoker  . Smokeless tobacco: Not on file  . Alcohol Use: No   OB History    No data available     Review of Systems  Constitutional: Negative for fever and chills.  HENT: Negative for congestion and sore throat.   Eyes: Negative.   Respiratory: Negative for chest tightness and shortness of breath.   Cardiovascular: Negative for chest pain.  Gastrointestinal: Positive for nausea, abdominal pain and diarrhea. Negative for vomiting, constipation and blood in stool.  Genitourinary: Negative.  Negative for dysuria.  Musculoskeletal: Negative for joint swelling, arthralgias and neck pain.  Skin:  Negative.  Negative for rash and wound.  Neurological: Negative for dizziness, weakness, light-headedness, numbness and headaches.  Psychiatric/Behavioral: Negative.       Allergies  Corn-containing products; Sulfa antibiotics; and Tape  Home Medications   Prior to Admission medications   Medication Sig Start Date End Date Taking? Authorizing Provider  ALPRAZolam (XANAX) 0.25 MG tablet Take 0.5-1 tablets (0.125-0.25 mg total) by mouth 3 (three) times daily as needed for anxiety. 08/01/14  Yes Claretta Fraise, MD  Aspirin-Acetaminophen-Caffeine (GOODY HEADACHE PO) Take 1 packet by mouth 4 (four) times daily as needed (for pain).   Yes Historical Provider, MD  Calcium Carbonate-Vitamin D (CALCARB 600/D PO) Take 1 tablet by mouth every evening.   Yes Historical Provider, MD  diphenhydrAMINE (BENADRYL) 25 mg capsule Take 25 mg by mouth every 6 (six) hours as needed for itching or allergies.   Yes Historical Provider, MD  KRILL OIL PO Take 1 capsule by mouth every evening.   Yes Historical Provider, MD  Multiple Vitamin (MULTIVITAMIN WITH MINERALS) TABS tablet Take 1 tablet by mouth daily.   Yes Historical Provider, MD  quinapril (ACCUPRIL) 40 MG tablet Take 40 mg by mouth every morning.   Yes Historical Provider, MD  timolol (BETIMOL) 0.25 % ophthalmic solution Place 1 drop into both eyes daily.   Yes Historical Provider, MD  verapamil (CALAN-SR) 240 MG CR tablet Take 240 mg by mouth every morning.   Yes Historical Provider, MD  esomeprazole (NEXIUM) 40 MG capsule Take 1 capsule (  40 mg total) by mouth 2 (two) times daily before a meal. 09/23/14   Evalee Jefferson, PA-C  sucralfate (CARAFATE) 1 GM/10ML suspension Take 10 mLs (1 g total) by mouth 4 (four) times daily -  with meals and at bedtime. 09/23/14   Evalee Jefferson, PA-C   BP 121/64 mmHg  Pulse 71  Temp(Src) 98.3 F (36.8 C) (Oral)  Resp 17  Ht 5\' 4"  (1.626 m)  Wt 145 lb (65.772 kg)  BMI 24.88 kg/m2  SpO2 97% Physical Exam  Constitutional: She  appears well-developed and well-nourished.  HENT:  Head: Normocephalic and atraumatic.  Eyes: Conjunctivae are normal.  Neck: Normal range of motion.  Cardiovascular: Normal rate, regular rhythm, normal heart sounds and intact distal pulses.   Pulmonary/Chest: Effort normal and breath sounds normal. She has no wheezes.  Abdominal: Soft. Bowel sounds are normal. There is tenderness in the epigastric area. There is no rigidity, no guarding, no CVA tenderness and negative Murphy's sign.  Musculoskeletal: Normal range of motion.  Neurological: She is alert.  Skin: Skin is warm and dry.  Psychiatric: She has a normal mood and affect.  Nursing note and vitals reviewed.   ED Course  Procedures (including critical care time) Labs Review Labs Reviewed  COMPREHENSIVE METABOLIC PANEL - Abnormal; Notable for the following:    Glucose, Bld 115 (*)    All other components within normal limits  CBC WITH DIFFERENTIAL/PLATELET - Abnormal; Notable for the following:    WBC 13.7 (*)    Platelets 438 (*)    Neutro Abs 10.0 (*)    All other components within normal limits  URINALYSIS, ROUTINE W REFLEX MICROSCOPIC (NOT AT [facility]) - Abnormal; Notable for the following:    Leukocytes, UA SMALL (*)    All other components within normal limits  URINE MICROSCOPIC-ADD ON - Abnormal; Notable for the following:    Squamous Epithelial / LPF FEW (*)    Bacteria, UA FEW (*)    All other components within normal limits  POC OCCULT BLOOD, ED - Abnormal; Notable for the following:    Fecal Occult Bld POSITIVE (*)    All other components within normal limits  LIPASE, BLOOD    Imaging Review Ct Abdomen Pelvis W Contrast  09/23/2014   ADDENDUM REPORT: 09/23/2014 12:46  ADDENDUM: I discussed these findings by telephone with Evalee Jefferson at approximately 12:40 p.m. on 09/23/2014.   Electronically Signed   By: Misty Stanley M.D.   On: 09/23/2014 12:46   09/23/2014   CLINICAL DATA:  Initial encounter for generalized  abdominal pain since Friday with nausea and diarrhea since 09/21/2014.  EXAM: CT ABDOMEN AND PELVIS WITH CONTRAST  TECHNIQUE: Multidetector CT imaging of the abdomen and pelvis was performed using the standard protocol following bolus administration of intravenous contrast.  CONTRAST:  24mL OMNIPAQUE IOHEXOL 300 MG/ML SOLN, 141mL OMNIPAQUE IOHEXOL 300 MG/ML SOLN  COMPARISON:  None.  FINDINGS: Lower chest:  Unremarkable.  Hepatobiliary: Insert normal Liver tiny layering stones are seen in the gallbladder fundus. No intrahepatic or extrahepatic biliary dilation.  Pancreas: No focal mass lesion. No dilatation of the main duct. No intraparenchymal cyst. No peripancreatic edema.  Spleen: No splenomegaly. No focal mass lesion.  Adrenals/Urinary Tract: No adrenal nodule or mass. Vascular calcifications seen in the hilum of the left kidney. No hydronephrosis on either side. 10 mm hypo attenuating lesion in the right kidney has density too high to allow classification as a simple cyst. 2.0 cm exophytic lesion in the interpolar  left kidney is likely a cyst. There is no hydronephrosis. Urinary bladder is normal in appearance.  Stomach/Bowel: Stomach is nondistended. No gastric wall thickening. No evidence of outlet obstruction.  Irregular wall thickening is seen in the second portion of the duodenum. There is a small out pouching of contrast which may be a duodenal diverticulum. There is associated periduodenal edema/ inflammation. Multiple small lymph nodes are seen in the retroperitoneal tissues around the descending duodenum. No evidence for retroperitoneal abscess.  Vascular/Lymphatic: No small bowel wall thickening. No small bowel dilatation. Terminal ileum is normal. Appendix is normal. No gross colonic mass. No colonic wall thickening. No substantial diverticular change.  Reproductive: Uterus is markedly atrophic, or potentially surgically absent. There is no adnexal mass.  Other: No intraperitoneal free fluid.   Musculoskeletal: Bone windows reveal no worrisome lytic or sclerotic osseous lesions.  IMPRESSION: Subtle irregular wall thickening in the duodenum with periduodenal edema/ inflammation. Small apparent outpouching could be a duodenum diverticulum. As such, these changes may be related to duodenitis or duodenum diverticulitis. Alternative considerations include peptic ulcer disease and even potentially perforated peptic ulcer (given the outpouching of contrast), although this is considered less likely. There is no evidence for edema within the pancreatic head or uncinate process to suggest these changes are related to focal pancreatitis in the region of the pancreatic head. No features to suggest acute cholecystitis although gallstones are evident.  Electronically Signed: By: Misty Stanley M.D. On: 09/23/2014 12:33   Dg Abd Acute W/chest  09/23/2014   CLINICAL DATA:  Generalized abdominal pain since Friday, nausea, urinary frequency and diarrhea since yesterday, smoker  EXAM: DG ABDOMEN ACUTE W/ 1V CHEST  COMPARISON:  None  FINDINGS: Normal heart size, mediastinal contours and pulmonary vascularity.  Atherosclerotic calcification of thoracic and abdominal aorta.  Lungs appear emphysematous but clear.  No pleural effusion or pneumothorax.  Normal bowel gas pattern.  No bowel dilatation, bowel wall thickening or free intraperitoneal air.  Nonobstructing calculus projects over LEFT renal pelvis, 3 mm diameter.  Bones diffusely demineralized with minimal thoracolumbar scoliosis.  IMPRESSION:  Emphysematous changes without infiltrate.  3 mm nonobstructing LEFT renal calculus.  Normal bowel gas pattern.   Electronically Signed   By: Lavonia Dana M.D.   On: 09/23/2014 10:38     EKG Interpretation None      MDM   Final diagnoses:  Abdominal pain  Duodenitis    Patients labs and/or radiological studies were reviewed and considered during the medical decision making and disposition process.  Results were also  discussed with patient. Pt was also seen by Dr. Roderic Palau during this visit.  Pt with duodenitis, probably secondary to her chronic nsaid use.  She was advised she needs close f/u with GI, referral given, or may choose to f/u with her prior GI specialist in [place].  Advised absolutely no nsaids, bc, goody's.  Only safe pain reliever at this time is tylenol.  She was prescribed nexium, bid use.  Has been taking prn, stating last dose over 1 week ago.  She was also prescribed carafate.  Advised return here for any worsened sx.  Re-exam at time of dc, pt is sx free, abdomen soft, no guarding.  Exam not c/w perforated PUD.      Evalee Jefferson, PA-C 09/24/14 [place], MD 09/24/14 [phone]

## 2014-09-24 ENCOUNTER — Ambulatory Visit: Payer: Medicare Other | Admitting: Family Medicine

## 2014-09-25 NOTE — Telephone Encounter (Signed)
I scheduled pt with Randall Hiss 10/16/14, pt states she may just continue to see her GI doctor in [place] she is going to talk with her husband when he gets home, pt is going to stay on schedule and call us back if she wants to cancel this appt.

## 2014-10-16 ENCOUNTER — Encounter: Payer: Self-pay | Admitting: Nurse Practitioner

## 2014-10-16 ENCOUNTER — Ambulatory Visit (INDEPENDENT_AMBULATORY_CARE_PROVIDER_SITE_OTHER): Payer: Medicare Other | Admitting: Nurse Practitioner

## 2014-10-16 VITALS — BP 118/67 | HR 86 | Temp 97.6°F | Ht 64.0 in | Wt 137.0 lb

## 2014-10-16 DIAGNOSIS — K625 Hemorrhage of anus and rectum: Secondary | ICD-10-CM

## 2014-10-16 DIAGNOSIS — Z8711 Personal history of peptic ulcer disease: Secondary | ICD-10-CM

## 2014-10-16 DIAGNOSIS — R1013 Epigastric pain: Secondary | ICD-10-CM

## 2014-10-16 DIAGNOSIS — R197 Diarrhea, unspecified: Secondary | ICD-10-CM | POA: Diagnosis not present

## 2014-10-16 NOTE — Patient Instructions (Signed)
1. We will have he signed a records release before he leaves today so we can obtain your last endoscopy report from [place] [facility] in [facility]. 2. Recommend an endoscopy as well as a colonoscopy given your symptoms and history. Please call let us know if you have like that done here or if she would prefer to return to [facility] endoscopy center in [facility]. If you return to [facility], we would be happy to provide records needed.

## 2014-10-16 NOTE — Progress Notes (Signed)
Primary Care Physician:  Claretta Fraise, MD Primary Gastroenterologist:  Dr. Oneida Alar  Chief Complaint  Patient presents with  . Follow-up  . Abdominal Pain    HPI:   69 year old female presents for evaluation on emergency room follow-up visit. Emergency room note reviewed. The patient was seen in the emergency room on 09/23/2014 for 3 day history of epigastric pain which is constant in the setting of a history of peptic ulcer disease. The pain at that time was briefly relieved by Mylanta and admitted to taking 4 BC powders daily for chronic arthritis pain. It was determined she likely has duodenitis secondary to NSAID use, was referred to GI and advised to stop all NSAIDs including ASA powders. Her Nexium was changed to twice a day. No endoscopy or colonoscopy found in our system.  Today she states she has a history of peptic ulcer disease "a very large ulcer" 12 years ago diagnosed on EGD at [facility] in [place]. Was placed on Nexium which she took for a time and stopped. No further symptoms until this year. In 05/2014 she was seen in the ER for epigastric pain and started on Nexium and Carafate. Was doing well until end of June when she was again seen in the ER as per above. Since her ER visit has only had 2 Goody powders. Tylenol not effective. One week after her visit she has some residual pain and since then has not had any more pain. 2 weeks ago she had an episode of diarrhea which has some blood in it but this cleared up after a few bowel movements and is having normal bowel movements now. Was also having dark diarrhea bowel movments when in the ER. She had a normal bowel movement this morning. Last colonoscopy about 8 years ago. Denies other episodes of hematochezia and melena. Typically she has a daily bowel movement which is consistent with Bristol Scale 4. Denies N/V, recent fever/chills. Has had unitntentional weight loss of about 9 pounts in the past month but states  she hasn't been eating as much. Denies chest pain, dyspnea, dizziness, lightheadedness, syncope, near syncope. Denies any other upper or lower GI symptoms.  Past Medical History  Diagnosis Date  . Hypertension     Past Surgical History  Procedure Laterality Date  . Dilitation and curritage      Current Outpatient Prescriptions  Medication Sig Dispense Refill  . ALPRAZolam (XANAX) 0.25 MG tablet Take 0.5-1 tablets (0.125-0.25 mg total) by mouth 3 (three) times daily as needed for anxiety. 90 tablet 5  . Calcium Carbonate-Vitamin D (CALCARB 600/D PO) Take 1 tablet by mouth every evening.    . diphenhydrAMINE (BENADRYL) 25 mg capsule Take 25 mg by mouth every 6 (six) hours as needed for itching or allergies.    Marland Kitchen esomeprazole (NEXIUM) 40 MG capsule Take 1 capsule (40 mg total) by mouth 2 (two) times daily before a meal. 60 capsule 0  . KRILL OIL PO Take 1 capsule by mouth every evening.    . Multiple Vitamin (MULTIVITAMIN WITH MINERALS) TABS tablet Take 1 tablet by mouth daily.    . quinapril (ACCUPRIL) 40 MG tablet Take 40 mg by mouth every morning.    . sucralfate (CARAFATE) 1 GM/10ML suspension Take 10 mLs (1 g total) by mouth 4 (four) times daily -  with meals and at bedtime. 420 mL 0  . timolol (BETIMOL) 0.25 % ophthalmic solution Place 1 drop into both eyes daily.    . verapamil (  CALAN-SR) 240 MG CR tablet Take 240 mg by mouth every morning.    . Aspirin-Acetaminophen-Caffeine (GOODY HEADACHE PO) Take 1 packet by mouth 4 (four) times daily as needed (for pain).     No current facility-administered medications for this visit.    Allergies as of 10/16/2014 - Review Complete 10/16/2014  Allergen Reaction Noted  . Corn-containing products Itching 08/01/2014  . Sulfa antibiotics Other (See Comments) 06/21/2014  . Tape Rash 06/21/2014    Family History  Problem Relation Age of Onset  . Diabetes Mother   . Hypertension Mother   . Hypertension Father     History   Social  History  . Marital Status: Married    Spouse Name: N/A  . Number of Children: N/A  . Years of Education: N/A   Occupational History  . Not on file.   Social History Main Topics  . Smoking status: Current Every Day Smoker  . Smokeless tobacco: Not on file  . Alcohol Use: No  . Drug Use: Not on file  . Sexual Activity: Not on file   Other Topics Concern  . Not on file   Social History Narrative    Review of Systems: 10 point ROS negative except as per HPI.    Physical Exam: BP 118/67 mmHg  Pulse 86  Temp(Src) 97.6 F (36.4 C)  Ht 5\' 4"  (1.626 m)  Wt 137 lb (62.143 kg)  BMI 23.50 kg/m2 General:   Alert and oriented. Pleasant and cooperative. Well-nourished and well-developed.  Head:  Normocephalic and atraumatic. Eyes:  Without icterus, sclera clear and conjunctiva pink.  Ears:  Normal auditory acuity. Cardiovascular:  S1, S2 present without murmurs appreciated. Normal pulses noted. Extremities without clubbing or edema. Respiratory:  Clear to auscultation bilaterally. No wheezes, rales, or rhonchi. No distress.  Gastrointestinal:  +BS, soft, and non-distended. Mild epigastric and LUQ TTP. No HSM noted. No guarding or rebound. No masses appreciated.  Rectal:  Deferred  Neurologic:  Alert and oriented x4;  grossly normal neurologically. Psych:  Alert and cooperative. Normal mood and affect. Heme/Lymph/Immune: No excessive bruising noted.    10/16/2014 10:25 AM

## 2014-10-18 NOTE — Assessment & Plan Note (Signed)
69 year old female with epigastric pain in the emergency room as well as a couple "dark stools." She has a history of peptic ulcer disease 12 years ago on endoscopy at [facility] endoscopy center in [facility]. In the emergency room date increased her Nexium to twice daily and started her on Carafate. Advised her to stop all NSAIDs she was currently taken up to 4 ASA powders per day. Since then she has only had 2 packets of ASA powders. Her symptoms have improved somewhat but there is still tenderness to palpation of her epigastric and left upper quadrant. This is concerning for recurrent ulceration. She is advised to have endoscopy as well as colonoscopy given her constellation of symptoms and history. She will discuss with her family whether to have this done here or at [facility] endoscopy center in [facility] and will call to notify us.

## 2014-10-18 NOTE — Assessment & Plan Note (Signed)
69 year old female for emergency room follow-up with a history of "a very large ulcer" 12 years ago diagnosed on EGD at [facility] endoscopy center in [facility] which, at the time, was in the setting of chronic aspirin powder use. When she presented to the emergency room 09/23/2014 she was also taking up to 4 BC powders daily for chronic arthritis pain as Tylenol isn't effective for her. She was started Carafate and Nexium increased to twice daily and advised to avoid all NSAIDs. States her abdominal pain has improved some. She is also noted some dark stools while in the emergency room. She is only taken to aspirin powder packets since her visit to the emergency room. Given her history and current symptoms she was advised to undergo endoscopy. She wants to discuss with her family whether to have this done here or back at [facility] endoscopy center in [facility] or previous procedure was done. She will call notify us.

## 2014-10-18 NOTE — Assessment & Plan Note (Signed)
69 year old female has recently had a couple episodes of diarrhea with noted rectal bleeding. This is since resolved and her bowel movement this morning was normal. Her last colonoscopy was 8 years ago. Given her history constellation of symptoms she was advised that she should undergo endoscopy and colonoscopy to further evaluate her symptoms. She will discuss with her family whether she wants this done here at or at [facility] endoscopy center in [facility] and will call to advise [place].

## 2014-10-18 NOTE — Assessment & Plan Note (Signed)
69 year old female patient with abdominal pain and history of peptic ulcer disease states she has had a few episodes of diarrhea which she noted some blood. No other episodes of hematochezia. Also admits unintentional weight loss, although she has had decreased appetite related to her abdominal pain. Her last colonoscopy was approximately 8 years ago at [facility] endoscopy center in [facility]. I have requested these records. At this point she is likely due to or near due for a repeat colonoscopy. Given her other symptoms she would also need an endoscopy. These have been recommended to her but the patient states she would like to discuss with her family whether to have the procedures done here or back [place] endoscopy or her previous procedures were done. She states she will call us and let us know.

## 2014-10-21 ENCOUNTER — Other Ambulatory Visit: Payer: Self-pay

## 2014-10-21 DIAGNOSIS — K279 Peptic ulcer, site unspecified, unspecified as acute or chronic, without hemorrhage or perforation: Secondary | ICD-10-CM

## 2014-10-21 DIAGNOSIS — K625 Hemorrhage of anus and rectum: Secondary | ICD-10-CM

## 2014-10-21 MED ORDER — SOD PICOSULFATE-MAG OX-CIT ACD 10-3.5-12 MG-GM-GM PO PACK: 1.0000 | PACK | ORAL | Status: DC

## 2014-10-24 ENCOUNTER — Ambulatory Visit (HOSPITAL_COMMUNITY)
Admission: RE | Admit: 2014-10-24 | Discharge: 2014-10-24 | Disposition: A | Discharge status: Home / Self Care | Payer: Medicare Other | Source: Ambulatory Visit | Attending: Gastroenterology | Admitting: Gastroenterology

## 2014-10-24 ENCOUNTER — Encounter (HOSPITAL_COMMUNITY): Payer: Self-pay

## 2014-10-24 ENCOUNTER — Encounter (HOSPITAL_COMMUNITY)
Admission: RE | Disposition: A | Discharge status: Home / Self Care | Payer: Self-pay | Source: Ambulatory Visit | Attending: Gastroenterology

## 2014-10-24 DIAGNOSIS — D126 Benign neoplasm of colon, unspecified: Secondary | ICD-10-CM | POA: Insufficient documentation

## 2014-10-24 DIAGNOSIS — K315 Obstruction of duodenum: Secondary | ICD-10-CM | POA: Insufficient documentation

## 2014-10-24 DIAGNOSIS — K921 Melena: Secondary | ICD-10-CM | POA: Insufficient documentation

## 2014-10-24 DIAGNOSIS — K621 Rectal polyp: Secondary | ICD-10-CM

## 2014-10-24 DIAGNOSIS — K222 Esophageal obstruction: Secondary | ICD-10-CM | POA: Diagnosis not present

## 2014-10-24 DIAGNOSIS — K625 Hemorrhage of anus and rectum: Secondary | ICD-10-CM | POA: Diagnosis not present

## 2014-10-24 DIAGNOSIS — K5669 Other intestinal obstruction: Secondary | ICD-10-CM | POA: Diagnosis not present

## 2014-10-24 DIAGNOSIS — K259 Gastric ulcer, unspecified as acute or chronic, without hemorrhage or perforation: Secondary | ICD-10-CM | POA: Insufficient documentation

## 2014-10-24 DIAGNOSIS — R1013 Epigastric pain: Secondary | ICD-10-CM | POA: Insufficient documentation

## 2014-10-24 DIAGNOSIS — K279 Peptic ulcer, site unspecified, unspecified as acute or chronic, without hemorrhage or perforation: Secondary | ICD-10-CM

## 2014-10-24 DIAGNOSIS — K2950 Unspecified chronic gastritis without bleeding: Secondary | ICD-10-CM | POA: Insufficient documentation

## 2014-10-24 DIAGNOSIS — K648 Other hemorrhoids: Secondary | ICD-10-CM | POA: Insufficient documentation

## 2014-10-24 HISTORY — PX: COLONOSCOPY: SHX5424

## 2014-10-24 HISTORY — PX: ESOPHAGOGASTRODUODENOSCOPY: SHX5428

## 2014-10-24 SURGERY — COLONOSCOPY
Anesthesia: Moderate Sedation

## 2014-10-24 MED ORDER — LIDOCAINE VISCOUS 2 % MT SOLN
OROMUCOSAL | Status: DC | PRN
  Administered 2014-10-24: 1 via OROMUCOSAL

## 2014-10-24 MED ORDER — MIDAZOLAM HCL 5 MG/5ML IJ SOLN
INTRAMUSCULAR | Status: DC | PRN
  Administered 2014-10-24: 1 mg via INTRAVENOUS
  Administered 2014-10-24 (×2): 2 mg via INTRAVENOUS
  Administered 2014-10-24 (×2): 1 mg via INTRAVENOUS
  Administered 2014-10-24: 2 mg via INTRAVENOUS

## 2014-10-24 MED ORDER — MEPERIDINE HCL 100 MG/ML IJ SOLN
INTRAMUSCULAR | Status: DC | PRN
  Administered 2014-10-24 (×4): 25 mg via INTRAVENOUS

## 2014-10-24 MED ORDER — MIDAZOLAM HCL 5 MG/5ML IJ SOLN
INTRAMUSCULAR | Status: AC
  Filled 2014-10-24: qty 10

## 2014-10-24 MED ORDER — SODIUM CHLORIDE 0.9 % IV SOLN
INTRAVENOUS | Status: DC
  Administered 2014-10-24: 13:00:00 via INTRAVENOUS

## 2014-10-24 MED ORDER — LIDOCAINE VISCOUS 2 % MT SOLN
OROMUCOSAL | Status: AC
  Filled 2014-10-24: qty 15

## 2014-10-24 MED ORDER — MEPERIDINE HCL 100 MG/ML IJ SOLN
INTRAMUSCULAR | Status: AC
  Filled 2014-10-24: qty 2

## 2014-10-24 NOTE — Progress Notes (Signed)
REVIEWED-NO ADDITIONAL RECOMMENDATIONS. 

## 2014-10-24 NOTE — H&P (Signed)
Primary Care Physician:  Claretta Fraise, MD Primary Gastroenterologist:  Dr. Oneida Alar  Pre-Procedure History & Physical: HPI:  Lacey Christensen is a 69 y.o. female here for BRBPR/ABDOMINAL PAIN.  Past Medical History  Diagnosis Date  . Hypertension   . PUD (peptic ulcer disease)     12 years ago, EGD at [facility] in [place]  . Narrow angle glaucoma suspect of both eyes     Past Surgical History  Procedure Laterality Date  . Dilitation and curritage      Prior to Admission medications   Medication Sig Start Date End Date Taking? Authorizing Provider  ALPRAZolam (XANAX) 0.25 MG tablet Take 0.5-1 tablets (0.125-0.25 mg total) by mouth 3 (three) times daily as needed for anxiety. 08/01/14  Yes Claretta Fraise, MD  Aspirin-Acetaminophen-Caffeine (GOODY HEADACHE PO) Take 1 packet by mouth 4 (four) times daily as needed (for pain).   Yes Historical Provider, MD  Multiple Vitamin (MULTIVITAMIN WITH MINERALS) TABS tablet Take 1 tablet by mouth daily.   Yes Historical Provider, MD  quinapril (ACCUPRIL) 40 MG tablet Take 40 mg by mouth every morning.   Yes Historical Provider, MD  Sod Picosulfate-Mag Ox-Cit Acd 10-3.5-12 MG-GM-GM PACK Take 1 Container by mouth as directed. 10/21/14  Yes Danie Binder, MD  sucralfate (CARAFATE) 1 GM/10ML suspension Take 10 mLs (1 g total) by mouth 4 (four) times daily -  with meals and at bedtime. 09/23/14  Yes Almyra Free Idol, PA-C  timolol (BETIMOL) 0.25 % ophthalmic solution Place 1 drop into both eyes daily.   Yes Historical Provider, MD  verapamil (CALAN-SR) 240 MG CR tablet Take 240 mg by mouth every morning.   Yes Historical Provider, MD  Calcium Carbonate-Vitamin D (CALCARB 600/D PO) Take 1 tablet by mouth every evening.    Historical Provider, MD  diphenhydrAMINE (BENADRYL) 25 mg capsule Take 25 mg by mouth every 6 (six) hours as needed for itching or allergies.    Historical Provider, MD  esomeprazole (NEXIUM) 40 MG capsule Take 1 capsule (40 mg  total) by mouth 2 (two) times daily before a meal. 09/23/14   Evalee Jefferson, PA-C  KRILL OIL PO Take 1 capsule by mouth every evening.    Historical Provider, MD    Allergies as of 10/21/2014 - Review Complete 10/18/2014  Allergen Reaction Noted  . Corn-containing products Itching 08/01/2014  . Sulfa antibiotics Other (See Comments) 06/21/2014  . Tape Rash 06/21/2014    Family History  Problem Relation Age of Onset  . Diabetes Mother   . Hypertension Mother   . Hypertension Father   . Colon cancer Neg Hx     History   Social History  . Marital Status: Married    Spouse Name: N/A  . Number of Children: N/A  . Years of Education: N/A   Occupational History  . Not on file.   Social History Main Topics  . Smoking status: Current Every Day Smoker -- 0.50 packs/day for 50 years    Types: Cigarettes  . Smokeless tobacco: Never Used  . Alcohol Use: No  . Drug Use: Not on file  . Sexual Activity: Yes    Birth Control/ Protection: Post-menopausal   Other Topics Concern  . Not on file   Social History Narrative    Review of Systems: See HPI, otherwise negative ROS   Physical Exam: BP 126/72 mmHg  Pulse 96  Temp(Src) 99.2 F (37.3 C) (Oral)  Resp 18  SpO2 98% General:   Alert,  pleasant and  cooperative in NAD Head:  Normocephalic and atraumatic. Neck:  Supple; Lungs:  Clear throughout to auscultation.    Heart:  Regular rate and rhythm. Abdomen:  Soft, nontender and nondistended. Normal bowel sounds, without guarding, and without rebound.   Neurologic:  Alert and  oriented x4;  grossly normal neurologically.  Impression/Plan:     BRBPR/ABDOMINAL PAIN  PLAN: EGD/TCS TODAY

## 2014-10-24 NOTE — Discharge Instructions (Signed)
You had 2 polyps removed. Your RECTAL BLEEDING IS MOST LIKELY DUE TO internal hemorrhoids. You have gastritis AND A SMALL SUPERFICIAL STOMACH ULCERS DUE TO aspirin use.  I biopsied your stomach. YOU HAVE A STRICTURE IN THE FIRST PART OF YOUR SMALL BOWEL WHICH WILL CAUSE PAIN IF YOU EAT A LARGE MEAL OR CHUNKS OF FOOD.     FOLLOW A SOFT MECHANICAL DIET. IF YOU CAN SMASH IT WITH A FORK, IT IS SOFT ENOUGH FOR YOU TO EAT. SEE INFO BELOW.  STRICTLY AVOID ASPIRIN, BC/GOODY POWDERS, IBUPROFEN/MOTRIN, OR NAPROXEN/ALEVE BECAUSE YOU HAVE A HISTORY OF ULCERS AND A STRICTURE..  CONTINUE Reese. TAKE 30 MINUTES BEFORE MEALS ONCE OR TWICE DAILY.  YOUR BIOPSY RESULTS WILL BE AVAILABLE IN MY CHART AFTER JUL 30 AND MY OFFICE WILL CONTACT YOU IN 10-14 DAYS WITH YOUR RESULTS.   FOLLOW UP IN 3 MOS.   Oct 28th at 10:30am  Next colonoscopy in 10 years IF THE BENEFITS OUTWEIGH THE RISKS.   ENDOSCOPY Care After Read the instructions outlined below and refer to this sheet in the next week. These discharge instructions provide you with general information on caring for yourself after you leave the hospital. While your treatment has been planned according to the most current medical practices available, unavoidable complications occasionally occur. If you have any problems or questions after discharge, call DR. Kymani Shimabukuro, [phone].  ACTIVITY  You may resume your regular activity, but move at a slower pace for the next 24 hours.   Take frequent rest periods for the next 24 hours.   Walking will help get rid of the air and reduce the bloated feeling in your belly (abdomen).   No driving for 24 hours (because of the medicine (anesthesia) used during the test).   You may shower.   Do not sign any important legal documents or operate any machinery for 24 hours (because of the anesthesia used during the test).    NUTRITION  Drink plenty of fluids.   You may resume your normal diet as instructed by your  doctor.   Begin with a light meal and progress to your normal diet. Heavy or fried foods are harder to digest and may make you feel sick to your stomach (nauseated).   Avoid alcoholic beverages for 24 hours or as instructed.    MEDICATIONS  You may resume your normal medications.   WHAT YOU CAN EXPECT TODAY  Some feelings of bloating in the abdomen.   Passage of more gas than usual.   Spotting of blood in your stool or on the toilet paper  .  IF YOU HAD POLYPS REMOVED DURING THE ENDOSCOPY:  Eat a soft diet IF YOU HAVE NAUSEA, BLOATING, ABDOMINAL PAIN, OR VOMITING.    FINDING OUT THE RESULTS OF YOUR TEST Not all test results are available during your visit. DR. Oneida Alar WILL CALL YOU WITHIN 14 DAYS OF YOUR PROCEDUE WITH YOUR RESULTS. Do not assume everything is normal if you have not heard from DR. Bradd Merlos IN TWO WEEKS, CALL HER OFFICE AT [phone].  SEEK IMMEDIATE MEDICAL ATTENTION AND CALL THE OFFICE: [phone] IF:  You have more than a spotting of blood in your stool.   Your belly is swollen (abdominal distention).   You are nauseated or vomiting.   You have a temperature over 101F.   You have abdominal pain or discomfort that is severe or gets worse throughout the day.   Gastritis  Gastritis is an inflammation (the body's way of reacting to injury  and/or infection) of the stomach. It is often caused by viral or bacterial (germ) infections. It can also be caused BY ALCOHOL, ASPIRIN, BC/GOODY POWDER'S, (IBUPROFEN) MOTRIN, OR ALEVE (NAPROXEN), chemicals (including alcohol), SPICY FOODS, and medications. This illness may be associated with generalized malaise (feeling tired, not well), UPPER ABDOMINAL STOMACH cramps, and fever. One common bacterial cause of gastritis is an organism known as H. Pylori. This can be treated with antibiotics.   SOFT MECHANICAL DIET This SOFT MECHANICAL DIET is restricted to:  Foods that are moist, soft-textured, and easy to chew and  swallow.   Meats that are ground or are minced no larger than one-quarter inch pieces. Meats are moist with gravy or sauce added.   Foods that do not include bread or bread-like textures except soft pancakes, well-moistened with syrup or sauce.   Textures with some chewing ability required.   Casseroles without rice.   Cooked vegetables that are less than half an inch in size and easily mashed with a fork. No cooked corn, peas, broccoli, cauliflower, cabbage, Brussels sprouts, asparagus, or other fibrous, non-tender or rubbery cooked vegetables.   Canned fruit except for pineapple. Fruit must be cut into pieces no larger than half an inch in size.   Foods that do not include nuts, seeds, coconut, or sticky textures.   FOOD TEXTURES FOR DYSPHAGIA DIET LEVEL 2 -SOFT MECHANICAL DIET (includes all foods on Dysphagia Diet Level 1 - Pureed, in addition to the foods listed below)  FOOD GROUP: Breads. RECOMMENDED: Soft pancakes, well-moistened with syrup or sauce.  AVOID: All others.  FOOD GROUP: Cereals.  RECOMMENDED: Cooked cereals with little texture, including oatmeal. Unprocessed wheat bran stirred into cereals for bulk. Note: If thin liquids are restricted, it is important that all of the liquid is absorbed into the cereal.  AVOID: All dry cereals and any cooked cereals that may contain flax seeds or other seeds or nuts. Whole-grain, dry, or coarse cereals. Cereals with nuts, seeds, dried fruit, and/or coconut.  FOOD GROUP: Desserts. RECOMMENDED: Pudding, custard. Soft fruit pies with bottom crust only. Canned fruit (excluding pineapple). Soft, moist cakes with icing.Frozen malts, milk shakes, frozen yogurt, eggnog, nutritional supplements, ice cream, sherbet, regular or sugar-free gelatin, or any foods that become thin liquid at either room (70 F) or body temperature (98 F).  AVOID: Dry, coarse cakes and cookies. Anything with nuts, seeds, coconut, pineapple, or dried fruit.  Breakfast yogurt with nuts. Rice or bread pudding.  FOOD GROUP: Fats. RECOMMENDED: Butter, margarine, cream for cereal (depending on liquid consistency recommendations), gravy, cream sauces, sour cream, sour cream dips with soft additives, mayonnaise, salad dressings, cream cheese, cream cheese spreads with soft additives, whipped toppings.  AVOID: All fats with coarse or chunky additives.  FOOD GROUP: Fruits. RECOMMENDED: Soft drained, canned, or cooked fruits without seeds or skin. Fresh soft and ripe banana. Fruit juices with a small amount of pulp. If thin liquids are restricted, fruit juices should be thickened to appropriate consistency.  AVOID: Fresh or frozen fruits. Cooked fruit with skin or seeds. Dried fruits. Fresh, canned, or cooked pineapple.  FOOD GROUP: Meats and Meat Substitutes. (Meat pieces should not exceed 1/4 of an inch cube and should be tender.) RECOMMENDED: Moistened ground or cooked meat, poultry, or fish. Moist ground or tender meat may be served with gravy or sauce. Casseroles without rice. Moist macaroni and cheese, well-cooked pasta with meat sauce, tuna noodle casserole, soft, moist lasagna. Moist meatballs, meatloaf, or fish loaf. Protein salads, such as  tuna or egg without large chunks, celery, or onion. Cottage cheese, smooth quiche without large chunks. Poached, scrambled, or soft-cooked eggs (egg yolks should not be runny but should be moist and able to be mashed with butter, margarine, or other moisture added to them). (Cook eggs to 160 F or use pasteurized eggs for safety.) Souffls may have small, soft chunks. Tofu. Well-cooked, slightly mashed, moist legumes, such as baked beans. All meats or protein substitutes should be served with sauces or moistened to help maintain cohesiveness in the oral cavity.  AVOID: Dry meats, tough meats (such as bacon, sausage, hot dogs, bratwurst). Dry casseroles or casseroles with rice or large chunks. Peanut butter. Cheese  slices and cubes. Hard-cooked or crisp fried eggs. Sandwiches.Pizza.  FOOD GROUP: Potatoes and Starches. RECOMMENDED: Well-cooked, moistened, boiled, baked, or mashed potatoes. Well-cooked shredded hash brown potatoes that are not crisp. (All potatoes need to be moist and in sauces.)Well-cooked noodles in sauce. Spaetzel or soft dumplings that have been moistened with butter or gravy.  AVOID: Potato skins and chips. Fried or French-fried potatoes. Rice.  FOOD GROUP: Soups. RECOMMENDED: Soups with easy-to-chew or easy-to-swallow meats or vegetables: Particle sizes in soups should be less than 1/2 inch. Soups will need to be thickened to appropriate consistency if soup is thinner than prescribed liquid consistency.  AVOID: Soups with large chunks of meat and vegetables. Soups with rice, corn, peas.  FOOD GROUP: Vegetables. RECOMMENDED: All soft, well-cooked vegetables. Vegetables should be less than a half inch. Should be easily mashed with a fork.  AVOID: Cooked corn and peas. Broccoli, cabbage, Brussels sprouts, asparagus, or other fibrous, non-tender or rubbery cooked vegetables.  FOOD GROUP: Miscellaneous. RECOMMENDED: Jams and preserves without seeds, jelly. Sauces, salsas, etc., that may have small tender chunks less than 1/2 inch. Soft, smooth chocolate bars that are easily chewed.  AVOID: Seeds, nuts, coconut, or sticky foods. Chewy candies such as caramels or licorice.   Polyps, Colon  A polyp is extra tissue that grows inside your body. Colon polyps grow in the large intestine. The large intestine, also called the colon, is part of your digestive system. It is a long, hollow tube at the end of your digestive tract where your body makes and stores stool. Most polyps are not dangerous. They are benign. This means they are not cancerous. But over time, some types of polyps can turn into cancer. Polyps that are smaller than a pea are usually not harmful. But larger polyps could someday  become or may already be cancerous. To be safe, doctors remove all polyps and test them.   PREVENTION There is not one sure way to prevent polyps. You might be able to lower your risk of getting them if you:  Eat more fruits and vegetables and less fatty food.   Do not smoke.   Avoid alcohol.   Exercise every day.   Lose weight if you are overweight.   Eating more calcium and folate can also lower your risk of getting polyps. Some foods that are rich in calcium are milk, cheese, and broccoli. Some foods that are rich in folate are chickpeas, kidney beans, and spinach.    Hemorrhoids Hemorrhoids are dilated (enlarged) veins around the rectum. Sometimes clots will form in the veins. This makes them swollen and painful. These are called thrombosed hemorrhoids. Causes of hemorrhoids include:  Constipation.   Straining to have a bowel movement.   HEAVY LIFTING  HOME CARE INSTRUCTIONS  Eat a well balanced diet and drink  6 to 8 glasses of water every day to avoid constipation. You may also use a bulk laxative.   Avoid straining to have bowel movements.   Keep anal area dry and clean.   Do not use a donut shaped pillow or sit on the toilet for long periods. This increases blood pooling and pain.   Move your bowels when your body has the urge; this will require less straining and will decrease pain and pressure.

## 2014-10-24 NOTE — Op Note (Signed)
[facility] [address] [place], [address]   ENDOSCOPY PROCEDURE REPORT  PATIENT: Lacey Christensen, Lacey Christensen  MR#: [id number] BIRTHDATE: 01-Aug-1945 , 68  yrs. old GENDER: female  ENDOSCOPIST: Danie Binder, MD REFERRED BY:   Claretta Fraise, MD PROCEDURE DATE: November 03, 2014 PROCEDURE:   EGD w/ biopsy INDICATIONS:epigastric pain. USES GOODY POWDERS. LARGE ULCER 12 YRS AGO. MEDICATIONS: Demerol 25 mg IV and Versed 2 mg IV TOPICAL ANESTHETIC:   Viscous Xylocaine  ASA CLASS:  DESCRIPTION OF PROCEDURE:     Physical exam was performed.  Informed consent was obtained from the patient after explaining the benefits, risks, and alternatives to the procedure.  The patient was connected to the monitor and placed in the left lateral position.  Continuous oxygen was provided by nasal cannula and IV medicine administered through an indwelling cannula.  After administration of sedation, the patients esophagus was intubated and the EC-3890Li ([id number])  endoscope was advanced under direct visualization to the second portion of the duodenum.  The scope was removed slowly by carefully examining the color, texture, anatomy, and integrity of the mucosa on the way out.  The patient was recovered in endoscopy and discharged home in satisfactory condition.  Estimated blood loss is zero unless otherwise noted in this procedure report.    ESOPHAGUS: A Schatzki ring was found at the gastroesophageal junction and was widely open.   STOMACH: DEFORMED PYLORUS CONSISTENT WITH PRIOR PUD.  3 MM SUPERFICIAL CLEAN BASED PYLORIC ULCER.   Mild non-erosive gastritis (inflammation) was found in the gastric antrum.  Multiple biopsies were performed using cold forceps.   DUODENUM: STRICTURE AT THE JUNCTION OF D1/D2 WHICH NARROWS LUMEN TO 9-10 MM.  UNABLE TO PASS ADULT GASTROSCOPE. PEDIATRIC GASTROSCOPE PASSED WITH MILD RESISTANCE.   The duodenal mucosa showed no abnormalities in the 2nd part of the  duodenum.  COMPLICATIONS: There were no immediate complications.  ENDOSCOPIC IMPRESSION: 1.   Schatzki ring at the gastroesophageal junction 2.   DEFORMED PYLORUS  AND SUPERFICIAL PYLORIC ULCER 3.   Non-erosive gastritis (inflammation) was found in the gastric antrum; multiple biopsies were performed 4.   STRICTURE AT THE JUNCTION OF D1/D2  RECOMMENDATIONS: FOLLOW A SOFT MECHANICAL DIET. STRICTLY AVOID ASPIRIN, BC/GOODY POWDERS, IBUPROFEN/MOTRIN, OR NAPROXEN/ALEVE. CONTINUE NEXIUM.  TAKE 30 MINUTES BEFORE MEALS ONCE OR TWICE DAILY. AWAIT BIOPSY RESULTS. FOLLOW UP Oct 28th at 10:30am.  CONSIDER BALLOON DILATION OR SURGERY IF PT UNABLE TOMAINTAIN WEIGHT. Next colonoscopy in 10 years IF THE BENEFITS OUTWEIGH THE RISKS.  REPEAT EXAM:    eSigned:  Danie Binder, MD 2014/11/03 4:15 PM   CPT CODES: ICD CODES:  The ICD and CPT codes recommended by this software are interpretations from the data that the clinical staff has captured with the software.  The verification of the translation of this report to the ICD and CPT codes and modifiers is the sole responsibility of the health care institution and practicing physician where this report was generated.  [employer] will not be held responsible for the validity of the ICD and CPT codes included on this report.  AMA assumes no liability for data contained or not contained herein. CPT is a Designer, television/film set of the [employer].

## 2014-10-24 NOTE — Op Note (Signed)
[facility] [address] [place], [address]   COLONOSCOPY PROCEDURE REPORT  PATIENT: Lacey Christensen, Lacey Christensen  MR#: [id number] BIRTHDATE: Mar 08, 1946 , 68  yrs. old GENDER: female ENDOSCOPIST: Danie Binder, MD REFERRED BY:   Claretta Fraise, MD PROCEDURE DATE:  11/01/2014 PROCEDURE:   Colonoscopy with cold biopsy polypectomy INDICATIONS:hematochezia. MEDICATIONS: Demerol 75 mg IV and Versed 7 mg IV  DESCRIPTION OF PROCEDURE:    Physical exam was performed.  Informed consent was obtained from the patient after explaining the benefits, risks, and alternatives to procedure.  The patient was connected to monitor and placed in left lateral position. Continuous oxygen was provided by nasal cannula and IV medicine administered through an indwelling cannula.  After administration of sedation and rectal exam, the patients rectum was intubated and the EC-3890Li ([id number])  colonoscope was advanced under direct visualization to the cecum.  The scope was removed slowly by carefully examining the color, texture, anatomy, and integrity mucosa on the way out.  The patient was recovered in endoscopy and discharged home in satisfactory condition. Estimated blood loss is zero unless otherwise noted in this procedure report.     COLON FINDINGS: The colon was redundant WITH AN ANGULATED RECTOSIGMOID JUNCTION REQUIRING CHANGING FROM ADULT TO PEDS COLONOSCOPE.  Manual abdominal counter-pressure was used to reach the cecum.  The patient was moved on to their back to reach the cecum, Two sessile polyps measuring 2-4 MM mm in size were found in the rectum.  A polypectomy was performed with cold forceps.  , and Small internal hemorrhoids were found.  PREP QUALITY: good.  CECAL W/D TIME: 15       minutes COMPLICATIONS: None  ENDOSCOPIC IMPRESSION: 1.   ANGULATED RECTOSIGMOID JUNCTION 2.   Two RECTAL polyps REMOVED 3.   Small internal hemorrhoids  RECOMMENDATIONS: SOFT MECHANICAL  DIET. STRICTLY AVOID ASPIRIN, BC/GOODY POWDERS, IBUPROFEN/MOTRIN, OR NAPROXEN/ALEVE. NEXIUM 30 MINUTES BEFORE MEALS ONCE OR TWICE DAILY. AWAIT BIOPSY RESULTS. FOLLOW UP Oct 28th at 10:30am. Next colonoscopy in 10 years with a peds scope IF THE BENEFITS OUTWEIGH THE RISKS.   _______________________________ eSignedDanie Binder, MD 11/01/14 3:37 PM    CPT CODES: ICD CODES:  The ICD and CPT codes recommended by this software are interpretations from the data that the clinical staff has captured with the software.  The verification of the translation of this report to the ICD and CPT codes and modifiers is the sole responsibility of the health care institution and practicing physician where this report was generated.  [employer] will not be held responsible for the validity of the ICD and CPT codes included on this report.  AMA assumes no liability for data contained or not contained herein. CPT is a Designer, television/film set of the [employer].

## 2014-10-28 NOTE — Progress Notes (Signed)
CC'ED TO PCP 

## 2014-10-29 ENCOUNTER — Encounter (HOSPITAL_COMMUNITY): Payer: Self-pay | Admitting: Gastroenterology

## 2014-11-01 ENCOUNTER — Telehealth: Payer: Self-pay | Admitting: Gastroenterology

## 2014-11-01 DIAGNOSIS — H40033 Anatomical narrow angle, bilateral: Secondary | ICD-10-CM | POA: Diagnosis not present

## 2014-11-01 DIAGNOSIS — H40053 Ocular hypertension, bilateral: Secondary | ICD-10-CM | POA: Diagnosis not present

## 2014-11-01 NOTE — Telephone Encounter (Signed)
Please call pt. HER stomach Bx shows gastritis DUE TO USING ASPIRIN PRODUCTS. She had HYPERPLASTIC POLYPS removed.    FOLLOW A SOFT MECHANICAL DIET. IF YOU CAN SMASH IT WITH A FORK, IT IS SOFT ENOUGH FOR YOU TO EAT.  STRICTLY AVOID ASPIRIN, BC/GOODY POWDERS, IBUPROFEN/MOTRIN, OR NAPROXEN/ALEVE BECAUSE YOU HAVE A HISTORY OF ULCERS AND A STRICTURE IN YOUR SMALL BOWEL DUE TO USING ASPIRIN PRODUCTS.  CONTINUE NEXIUM. TAKE 30 MINUTES BEFORE MEALS ONCE OR TWICE DAILY.  FOLLOW UP Oct 28th at 10:30am  Next colonoscopy in 10 years IF THE BENEFITS OUTWEIGH THE RISKS.

## 2014-11-01 NOTE — Telephone Encounter (Signed)
LMOM to call back

## 2014-11-04 ENCOUNTER — Telehealth: Payer: Self-pay | Admitting: Gastroenterology

## 2014-11-04 NOTE — Telephone Encounter (Signed)
REMINDER IN EPIC °

## 2014-11-04 NOTE — Telephone Encounter (Signed)
Called pt and she is aware of results.

## 2014-11-04 NOTE — Telephone Encounter (Signed)
Patient was returning GF call from Friday. I told her that CJ would call her back but she was with another patient at the moment.  [phone]

## 2014-11-06 NOTE — Telephone Encounter (Signed)
Pt is aware of results. 

## 2014-11-13 DIAGNOSIS — H40031 Anatomical narrow angle, right eye: Secondary | ICD-10-CM | POA: Diagnosis not present

## 2014-11-21 ENCOUNTER — Other Ambulatory Visit: Payer: Self-pay

## 2014-11-21 MED ORDER — VERAPAMIL HCL ER 240 MG PO TBCR: 240.0000 mg | EXTENDED_RELEASE_TABLET | Freq: Every morning | ORAL | Status: DC

## 2014-11-21 MED ORDER — QUINAPRIL HCL 40 MG PO TABS: 40.0000 mg | ORAL_TABLET | Freq: Every morning | ORAL | Status: DC

## 2014-11-27 DIAGNOSIS — H40032 Anatomical narrow angle, left eye: Secondary | ICD-10-CM | POA: Diagnosis not present

## 2015-01-24 ENCOUNTER — Encounter: Payer: Self-pay | Admitting: Gastroenterology

## 2015-01-24 ENCOUNTER — Ambulatory Visit (INDEPENDENT_AMBULATORY_CARE_PROVIDER_SITE_OTHER): Payer: Medicare Other | Admitting: Gastroenterology

## 2015-01-24 VITALS — BP 126/73 | HR 58 | Temp 97.6°F | Ht 64.0 in | Wt 131.4 lb

## 2015-01-24 DIAGNOSIS — R634 Abnormal weight loss: Secondary | ICD-10-CM

## 2015-01-24 DIAGNOSIS — K315 Obstruction of duodenum: Secondary | ICD-10-CM

## 2015-01-24 DIAGNOSIS — K279 Peptic ulcer, site unspecified, unspecified as acute or chronic, without hemorrhage or perforation: Secondary | ICD-10-CM

## 2015-01-24 MED ORDER — ESOMEPRAZOLE MAGNESIUM 40 MG PO CPDR: 40.0000 mg | DELAYED_RELEASE_CAPSULE | Freq: Every day | ORAL | Status: DC

## 2015-01-24 NOTE — Patient Instructions (Signed)
1. Continue nexium once daily before breakfast. RX sent to [facility] mail order.  2. I will discuss further management with Dr. Oneida Alar and we will let you know the game plan next week.  3. Continue soft mechanical diet for now.

## 2015-01-24 NOTE — Assessment & Plan Note (Signed)
H/O remoted large peptic ulcer with recent abnormal EGD in 09/2014. Small superficial pyloric ulcer and stricture at D1/D2. Clinically patient feels better but still consuming mechanical soft diet. ASA powders 1-2 per month. PPI daily. Ongoing weight loss is concerning, may be secondary to dietary changes.   To discuss further with Dr. Oneida Alar. Patient may require dilation of duodenal stricture. Patient wants to come off Nexium and I advise against that right now. May be able to reconsider over the next few months but would like for patient to have stabilized her weight loss first. Further recommendations to follow.

## 2015-01-24 NOTE — Progress Notes (Signed)
Primary Care Physician: Claretta Fraise, MD  Primary Gastroenterologist:  Barney Drain, MD   Chief Complaint  Patient presents with  . Follow-up    HPI: Lacey Christensen is a 69 y.o. female here for follow-up. EGD and colonoscopy in July 2016 by Dr. Oneida Alar. She had angulated rectosigmoid junction, 2 rectal polyps removed, small internal hemorrhoids. Polyps were hyperplastic. Next colonoscopy due in 10 years with the pediatric scope if benefits outweigh the risk at that time. She had a Schatzki ring at the GE junction, deformed pylorus and 72mm superficial pyloric ulcer, nonerosive gastritis in the gastric antrum, stricture at junction of D1/D2. Stomach biopsy showed chronic gastritis, negative H pylori.  Weight down 6 pounds since 09/2014. Taking Nexium 40mg  daily only. No further abdominal pain. Doesn't feel hungry. Consuming soft mechanical diet only. Eats two meals a day and 2-3 snacks. No bread. No heartburn. No vomiting. BM regular. Uses ASA powders about twice per month since 09/2014.   Wt Readings from Last 3 Encounters:  01/24/15 131 lb 6.4 oz (59.603 kg)  10/16/14 137 lb (62.143 kg)  09/23/14 145 lb (65.772 kg)   Wants to get off Nexium.   Current Outpatient Prescriptions  Medication Sig Dispense Refill  . ALPRAZolam (XANAX) 0.25 MG tablet Take 0.5-1 tablets (0.125-0.25 mg total) by mouth 3 (three) times daily as needed for anxiety. 90 tablet 5  . Calcium Carbonate-Vitamin D (CALCARB 600/D PO) Take 1 tablet by mouth every evening.    . diphenhydrAMINE (BENADRYL) 25 mg capsule Take 25 mg by mouth every 6 (six) hours as needed for itching or allergies.    Marland Kitchen esomeprazole (NEXIUM) 40 MG capsule Take 1 capsule (40 mg total) by mouth daily before breakfast. 30 capsule 11  . KRILL OIL PO Take 1 capsule by mouth every evening.    . Multiple Vitamin (MULTIVITAMIN WITH MINERALS) TABS tablet Take 1 tablet by mouth daily.    . quinapril (ACCUPRIL) 40 MG tablet Take 1 tablet (40 mg total)  by mouth every morning. 90 tablet 0  . verapamil (CALAN-SR) 240 MG CR tablet Take 1 tablet (240 mg total) by mouth every morning. 90 tablet 0   No current facility-administered medications for this visit.    Allergies as of 01/24/2015 - Review Complete 01/24/2015  Allergen Reaction Noted  . Corn-containing products Itching 08/01/2014  . Sulfa antibiotics Other (See Comments) 06/21/2014  . Tape Rash 06/21/2014    ROS:  General: Negative for anorexia,  fever, chills, fatigue, weakness. See hpi. ENT: Negative for hoarseness, difficulty swallowing , nasal congestion. CV: Negative for chest pain, angina, palpitations, dyspnea on exertion, peripheral edema.  Respiratory: Negative for dyspnea at rest, dyspnea on exertion, cough, sputum, wheezing.  GI: See history of present illness. GU:  Negative for dysuria, hematuria, urinary incontinence, urinary frequency, nocturnal urination.  Endo: see hpi   Physical Examination:   BP 126/73 mmHg  Pulse 58  Temp(Src) 97.6 F (36.4 C)  Ht 5\' 4"  (1.626 m)  Wt 131 lb 6.4 oz (59.603 kg)  BMI 22.54 kg/m2  General: Well-nourished, well-developed in no acute distress.  Eyes: No icterus. Mouth: Oropharyngeal mucosa moist and pink , no lesions erythema or exudate. Lungs: Clear to auscultation bilaterally.  Heart: Regular rate and rhythm, no murmurs rubs or gallops.  Abdomen: Bowel sounds are normal, nontender, nondistended, no hepatosplenomegaly or masses, no abdominal bruits or hernia , no rebound or guarding.   Extremities: No lower extremity edema. No clubbing or deformities. Neuro: Alert  and oriented x 4   Skin: Warm and dry, no jaundice.   Psych: Alert and cooperative, normal mood and affect.  Labs:  Lab Results  Component Value Date   WBC 13.7* 09/23/2014   HGB 13.5 09/23/2014   HCT 40.4 09/23/2014   MCV 94.6 09/23/2014   PLT 438* 09/23/2014   Lab Results  Component Value Date   CREATININE 0.76 09/23/2014   BUN 11 09/23/2014   NA  139 09/23/2014   K 4.7 09/23/2014   CL 103 09/23/2014   CO2 27 09/23/2014   Lab Results  Component Value Date   ALT 19 09/23/2014   AST 18 09/23/2014   ALKPHOS 66 09/23/2014   BILITOT 0.5 09/23/2014   Lab Results  Component Value Date   TSH 3.770 09/17/2014   Lab Results  Component Value Date   LIPASE 48 09/23/2014     Imaging Studies: No results found.

## 2015-01-27 NOTE — Progress Notes (Signed)
CC'ED TO PCP 

## 2015-02-04 ENCOUNTER — Telehealth: Payer: Self-pay

## 2015-02-04 NOTE — Telephone Encounter (Signed)
Pt called and said she has been waiting for recommendations since she was seen here last. She said Neil Crouch, PA was going to discuss her case with Dr. Oneida Alar and get back with her. Said she has been on a soft mechanical diet and she is tired of it, she would like to know what can be done. I told her I was very sorry and I would be glad to have Dr. Oneida Alar call her on Thursday when she returns and discuss her concerns. She said I could let her know to call her, but if she does not get a phone call from her on Thursday, she is gong to make an appt to go somewhere else for a 2nd opinion.

## 2015-02-04 NOTE — Telephone Encounter (Signed)
I tried to call the patient, no answer,lmom 

## 2015-02-04 NOTE — Telephone Encounter (Signed)
Tried to call pt- NA-LMOM to return call.  

## 2015-02-04 NOTE — Telephone Encounter (Signed)
Reviewed situation in Dr. Oneida Alar absence.  Patient has a post bulbar stricture. The Peds scope traversed it. No balloon dilation yet. If patient is tired of her current diet and not having any nausea,.vomiting or significant early satiety, I would recommend advancing her diet to low residue but in lieu of 3 regular meals daily would make it more of a gastroparesis diet  - 5-6 meals daily. If she has problems with advancing her diet, she may yet need balloon dilation of the stricture or even surgery.  She should continue Nexium. She should absolutely refrain from taking all forms of aspirin powders. I would not even go a long with only a couple of times monthly.  She should completely refrain from taking all ASA/NSAID products. She should call us back within the next several days and let us know how she is doing and we will go from there.

## 2015-02-04 NOTE — Telephone Encounter (Signed)
Correction, i wan unable to leave a message on her machine.  I will try back tomorrow.

## 2015-02-04 NOTE — Telephone Encounter (Signed)
Routing to Dr Gala Romney to speak with Magda Paganini and make recommendations in Dr. Oneida Alar absence

## 2015-02-05 ENCOUNTER — Telehealth: Payer: Self-pay | Admitting: Gastroenterology

## 2015-02-05 NOTE — Telephone Encounter (Signed)
See note from 02/04/2015. I have sent note to Neil Crouch, PA.

## 2015-02-05 NOTE — Telephone Encounter (Signed)
We will fax her records to her old GI doctor's at no charge.  Please have her sign a release and fax her GI records.

## 2015-02-05 NOTE — Telephone Encounter (Signed)
Lacey Christensen, please see phone note of 02/05/2015 from [place]. Pt had returned call.  If you want me to call pt, please let me know.

## 2015-02-05 NOTE — Telephone Encounter (Signed)
Lacey Christensen, please try to call the patient with RMR's recommendation

## 2015-02-05 NOTE — Telephone Encounter (Signed)
Pt called this morning asking to speak with JL. I told her that JL wasn't in the office today. I later seen another phone note that LSL wanted patient to return her call. Please call patient back at [phone]

## 2015-02-05 NOTE — Telephone Encounter (Signed)
I spoke to the patient. Discussed Dr. Roseanne Kaufman recommendations. Answered multiple questions. She is considering going back to her previous GI doctor for second opinion. [facility] in [place], Dr. Kathryne Hitch.   She is mostly upset with not seeing the doctor. States her card for her last appointment said she was going to see Dr. Oneida Alar but she was on my schedule. She prefers seeing the MD not an APP. She apologized for feeling this way.   She wants to think about what she would like to do. She wants to know if she can come to our office and sign a release of information so records can be sent to Dr. Percell Miller. She wants to know if she has to pay for it. I told her I would let Rosendo Gros know about this concern.   I requested she let us know next week how she is doing on advancing diet.   I told her I would make Dr. Oneida Alar know of course of events.

## 2015-02-05 NOTE — Telephone Encounter (Signed)
I have LMOM to have patient return my call.

## 2015-02-06 NOTE — Telephone Encounter (Signed)
REVIEWED. WILL CALL PT TO DISCUSS CONCERNS.

## 2015-02-10 DIAGNOSIS — H40033 Anatomical narrow angle, bilateral: Secondary | ICD-10-CM | POA: Diagnosis not present

## 2015-02-10 DIAGNOSIS — H02411 Mechanical ptosis of right eyelid: Secondary | ICD-10-CM | POA: Diagnosis not present

## 2015-02-10 DIAGNOSIS — H40053 Ocular hypertension, bilateral: Secondary | ICD-10-CM | POA: Diagnosis not present

## 2015-02-10 NOTE — Telephone Encounter (Signed)
Called patient TO DISCUSS CONCERNS. LVM-SHA CAN CALL [phone] TO DISCUSS. CANCEL FUTURE APPTS.

## 2015-02-10 NOTE — Telephone Encounter (Signed)
Waiting on a release

## 2015-02-10 NOTE — Telephone Encounter (Signed)
Pt returned Dr. Nona Dell call. Said she should be at home the remainder of the day. She still has plans to go elsewhere, not sure when she can come by to sign release.

## 2015-02-11 NOTE — Telephone Encounter (Signed)
REVIEWED. PT DESIRES TO SEE ANOTHER GO DOCTOR.

## 2015-02-11 NOTE — Telephone Encounter (Signed)
Noted.  Should we cancel all her future appointments?

## 2015-02-12 NOTE — Telephone Encounter (Signed)
CANCEL FUTURE APPTS.

## 2015-02-12 NOTE — Telephone Encounter (Signed)
Future recalls/appointments were cancelled

## 2015-02-15 ENCOUNTER — Other Ambulatory Visit: Payer: Self-pay | Admitting: Family Medicine

## 2015-02-26 DIAGNOSIS — Z8719 Personal history of other diseases of the digestive system: Secondary | ICD-10-CM | POA: Diagnosis not present

## 2015-02-26 DIAGNOSIS — K315 Obstruction of duodenum: Secondary | ICD-10-CM | POA: Diagnosis not present

## 2015-03-18 ENCOUNTER — Other Ambulatory Visit: Payer: Self-pay | Admitting: Family Medicine

## 2015-03-18 NOTE — Telephone Encounter (Signed)
Last seen 07/31/13  Dr Livia Snellen  iF APPROVED route to nurse to call into [facility]

## 2015-03-19 ENCOUNTER — Telehealth: Payer: Self-pay | Admitting: Family Medicine

## 2015-03-19 MED ORDER — ALPRAZOLAM 0.25 MG PO TABS: 0.1250 mg | ORAL_TABLET | Freq: Three times a day (TID) | ORAL | Status: DC | PRN

## 2015-03-19 NOTE — Telephone Encounter (Signed)
Patient aware and rx called into pharmacy.  

## 2015-03-19 NOTE — Telephone Encounter (Signed)
Patient has an appointment on 12/27 with Dr. Livia Snellen and wants to know if we can refill her alprazolam

## 2015-03-19 NOTE — Telephone Encounter (Signed)
is okay to refill her alprazolam 1

## 2015-03-25 ENCOUNTER — Ambulatory Visit (INDEPENDENT_AMBULATORY_CARE_PROVIDER_SITE_OTHER): Payer: Medicare Other | Admitting: Family Medicine

## 2015-03-25 ENCOUNTER — Encounter (INDEPENDENT_AMBULATORY_CARE_PROVIDER_SITE_OTHER): Payer: Self-pay

## 2015-03-25 ENCOUNTER — Ambulatory Visit (INDEPENDENT_AMBULATORY_CARE_PROVIDER_SITE_OTHER): Payer: Medicare Other

## 2015-03-25 ENCOUNTER — Encounter: Payer: Self-pay | Admitting: Family Medicine

## 2015-03-25 VITALS — BP 132/81 | HR 90 | Temp 97.2°F | Ht 64.0 in | Wt 136.0 lb

## 2015-03-25 DIAGNOSIS — M542 Cervicalgia: Secondary | ICD-10-CM

## 2015-03-25 DIAGNOSIS — K315 Obstruction of duodenum: Secondary | ICD-10-CM | POA: Diagnosis not present

## 2015-03-25 DIAGNOSIS — E785 Hyperlipidemia, unspecified: Secondary | ICD-10-CM | POA: Diagnosis not present

## 2015-03-25 DIAGNOSIS — F411 Generalized anxiety disorder: Secondary | ICD-10-CM | POA: Insufficient documentation

## 2015-03-25 DIAGNOSIS — H409 Unspecified glaucoma: Secondary | ICD-10-CM | POA: Insufficient documentation

## 2015-03-25 DIAGNOSIS — I10 Essential (primary) hypertension: Secondary | ICD-10-CM | POA: Insufficient documentation

## 2015-03-25 NOTE — Progress Notes (Signed)
 Chief Complaint  Patient presents with  . Medication Refill  . Shoulder Pain   The patient has been experiencing posterior neck pain radiating to the upper shoulder on the right. Moderate pain. Worse with shoulder elevation and neck rotation. Onset 2-3 months ago with gradual worsening.  She has been  treated long term for high blood pressure and anxiety. She's had borderline cholesterol elevation as well. Saw Dr. Dean Harris  Of cardiology in [place] shortly after being seen here 6 mos ago. Had no mitral prolapse noted. Also had stress test done at 2 levels at that time. Overall report was that she had no heart dx.    follow-up of hypertension. Patient has no history of headache chest pain or shortness of breath or recent cough. Patient also denies symptoms of TIA such as numbness weakness lateralizing. Patient denies side effects from medication.  Patient in for follow-up of elevated cholesterol. Doing well without complaints on current medication. Denies side effects of Krill oil including abdominal discomfort and nausea. Also in today for liver function testing.   Her past history is negative for heart disease stroke cancer etc.  History Lawren has a past medical history of Hypertension.   She has past surgical history that includes dilitation and curritage.   Her family history includes Diabetes in her mother; Hypertension in her father and mother.She reports that she has been smoking.  She does not have any smokeless tobacco history on file. She reports that she does not drink alcohol. Her drug history is not on file.    ROS Review of Systems  Constitutional: Negative for fever, chills, diaphoresis, appetite change, fatigue and unexpected weight change.  HENT: Negative for congestion, ear pain, hearing loss, postnasal drip, rhinorrhea, sneezing, sore throat and trouble swallowing.   Eyes: Negative for pain.  Respiratory: Negative for cough, chest tightness and shortness of  breath.   Cardiovascular: Negative for chest pain and palpitations.  Gastrointestinal: Positive for heartburn when not using PPI. Negative for nausea, vomiting, diarrhea and constipation.  Genitourinary: Negative for dysuria, frequency and menstrual problem.  Musculoskeletal: Negative for joint swelling and arthralgias.  Skin: Negative for rash.  Neurological: Negative for dizziness, weakness, numbness and headaches. Psychiatric/Behavioral: Negative for dysphoric mood and agitation.    Objective:  BP 132/76 mmHg  Pulse 79  Temp(Src) 97.8 F (36.6 C) (Oral)  Ht 5' 3.5" (1.613 m)  Wt 145 lb 9.6 oz (66.044 kg)  BMI 25.38 kg/m2  Physical Exam  Constitutional: She is oriented to person, place, and time. She appears well-developed and well-nourished. No distress.  HENT:  Head: Normocephalic and atraumatic.  Right Ear: External ear normal.  Left Ear: External ear normal.  Nose: Nose normal.  Mouth/Throat: Oropharynx is clear and moist.  Eyes: Conjunctivae and EOM are normal. Pupils are equal, round, and reactive to light.  Neck: Normal range of motion. Neck supple. No thyromegaly present.  Cardiovascular: Normal rate, regular rhythm and normal heart sounds.   No murmur heard. Pulmonary/Chest: Effort normal and breath sounds normal. No respiratory distress. She has no wheezes. She has no rales.  Abdominal: Soft. Bowel sounds are normal. She exhibits no distension. There is no tenderness.  Lymphadenopathy:    She has no cervical adenopathy.  Neurological: She is alert and oriented to person, place, and time. She has normal reflexes.  Skin: Skin is warm and dry.  Psychiatric: She has a normal mood and affect. Her behavior is normal. Judgment and thought content normal.  Results   for orders placed or performed in visit on 03/25/15  CMP14+EGFR  Result Value Ref Range   Glucose 105 (H) 65 - 99 mg/dL   BUN 10 8 - 27 mg/dL   Creatinine, Ser 0.67 0.57 - 1.00 mg/dL   GFR calc non Af Amer 90  >59 mL/min/1.73   GFR calc Af Amer 104 >59 mL/min/1.73   BUN/Creatinine Ratio 15 11 - 26   Sodium 142 134 - 144 mmol/L   Potassium 5.4 (H) 3.5 - 5.2 mmol/L   Chloride 102 96 - 106 mmol/L   CO2 22 18 - 29 mmol/L   Calcium 10.6 (H) 8.7 - 10.3 mg/dL   Total Protein 7.2 6.0 - 8.5 g/dL   Albumin 4.5 3.6 - 4.8 g/dL   Globulin, Total 2.7 1.5 - 4.5 g/dL   Albumin/Globulin Ratio 1.7 1.1 - 2.5   Bilirubin Total <0.2 0.0 - 1.2 mg/dL   Alkaline Phosphatase 81 39 - 117 IU/L   AST 18 0 - 40 IU/L   ALT 15 0 - 32 IU/L  Lipid panel  Result Value Ref Range   Cholesterol, Total 204 (H) 100 - 199 mg/dL   Triglycerides 214 (H) 0 - 149 mg/dL   HDL 41 >39 mg/dL   VLDL Cholesterol Cal 43 (H) 5 - 40 mg/dL   LDL Calculated 120 (H) 0 - 99 mg/dL   Chol/HDL Ratio 5.0 (H) 0.0 - 4.4 ratio units     Assessment & Plan:    1. Hyperlipidemia   2. Neck pain   3. Duodenal stricture   4. Essential hypertension   5. Generalized anxiety disorder   6. Glaucoma     Meds ordered this encounter  Medications  . latanoprost (XALATAN) 0.005 % ophthalmic solution    Sig: 1 drop.  . traMADol (ULTRAM) 50 MG tablet    Sig: 1 by mouth every 12 hours when necessary, sparingly    Orders Placed This Encounter  Procedures  . DG Cervical Spine Complete    Standing Status: Future     Number of Occurrences: 1     Standing Expiration Date: 05/24/2016    Order Specific Question:  Reason for Exam (SYMPTOM  OR DIAGNOSIS REQUIRED)    Answer:  shoulder and neck pain    Order Specific Question:  Preferred imaging location?    Answer:  Internal  . CMP14+EGFR  . Lipid panel    Diet and exercise encouraged Continue all meds as discussed Follow up in 6 mos.  Warren Stacks, MD       Warren Stacks, M.D. 

## 2015-03-26 DIAGNOSIS — H40033 Anatomical narrow angle, bilateral: Secondary | ICD-10-CM | POA: Diagnosis not present

## 2015-03-26 LAB — LIPID PANEL
CHOLESTEROL TOTAL: 204 mg/dL — AB (ref 100–199)
Chol/HDL Ratio: 5 ratio units — ABNORMAL HIGH (ref 0.0–4.4)
HDL: 41 mg/dL (ref >39)
LDL CALC: 120 mg/dL — AB (ref 0–99)
Triglycerides: 214 mg/dL — ABNORMAL HIGH (ref 0–149)
VLDL CHOLESTEROL CAL: 43 mg/dL — AB (ref 5–40)

## 2015-03-26 LAB — CMP14+EGFR
ALT: 15 IU/L (ref 0–32)
AST: 18 IU/L (ref 0–40)
Albumin/Globulin Ratio: 1.7 (ref 1.1–2.5)
Albumin: 4.5 g/dL (ref 3.6–4.8)
Alkaline Phosphatase: 81 IU/L (ref 39–117)
BUN/Creatinine Ratio: 15 (ref 11–26)
BUN: 10 mg/dL (ref 8–27)
CALCIUM: 10.6 mg/dL — AB (ref 8.7–10.3)
CHLORIDE: 102 mmol/L (ref 96–106)
CO2: 22 mmol/L (ref 18–29)
CREATININE: 0.67 mg/dL (ref 0.57–1.00)
GFR calc Af Amer: 104 mL/min/{1.73_m2} (ref >59)
GFR calc non Af Amer: 90 mL/min/{1.73_m2} (ref >59)
Globulin, Total: 2.7 g/dL (ref 1.5–4.5)
Glucose: 105 mg/dL — ABNORMAL HIGH (ref 65–99)
Potassium: 5.4 mmol/L — ABNORMAL HIGH (ref 3.5–5.2)
Sodium: 142 mmol/L (ref 134–144)
Total Protein: 7.2 g/dL (ref 6.0–8.5)

## 2015-04-15 ENCOUNTER — Encounter: Payer: Self-pay | Admitting: Family Medicine

## 2015-04-15 ENCOUNTER — Ambulatory Visit (INDEPENDENT_AMBULATORY_CARE_PROVIDER_SITE_OTHER): Payer: Medicare Other | Admitting: Family Medicine

## 2015-04-15 VITALS — BP 151/87 | HR 101 | Temp 97.2°F | Ht 64.0 in | Wt 136.0 lb

## 2015-04-15 DIAGNOSIS — F411 Generalized anxiety disorder: Secondary | ICD-10-CM

## 2015-04-15 DIAGNOSIS — R922 Inconclusive mammogram: Secondary | ICD-10-CM

## 2015-04-15 DIAGNOSIS — Z23 Encounter for immunization: Secondary | ICD-10-CM | POA: Diagnosis not present

## 2015-04-15 DIAGNOSIS — I10 Essential (primary) hypertension: Secondary | ICD-10-CM

## 2015-04-15 DIAGNOSIS — M5030 Other cervical disc degeneration, unspecified cervical region: Secondary | ICD-10-CM | POA: Diagnosis not present

## 2015-04-15 DIAGNOSIS — M542 Cervicalgia: Secondary | ICD-10-CM | POA: Diagnosis not present

## 2015-04-15 MED ORDER — ALPRAZOLAM 0.25 MG PO TABS: 0.1250 mg | ORAL_TABLET | Freq: Three times a day (TID) | ORAL | Status: DC | PRN

## 2015-04-15 MED ORDER — TIZANIDINE HCL 4 MG PO TABS: 4.0000 mg | ORAL_TABLET | Freq: Every day | ORAL | Status: DC

## 2015-04-15 NOTE — Progress Notes (Signed)
Subjective:  Patient ID: Jameah Roubideaux, female    DOB: 12-Oct-1945  Age: 70 y.o. MRN: [id number]  CC: breast exam and Back Pain   HPI Analeah Meinhardt presents for concern for heterogenous dense breast tissue noted in past with rec. of 3d Mammogram by radiology  Posterior neck pain with vacuuming, mopping, etc. moderate intensity. Relieved by rest. No radiation.  History Hosanna has a past medical history of Hypertension; PUD (peptic ulcer disease); and Narrow angle glaucoma suspect of both eyes.   She has past surgical history that includes dilitation and curritage; Colonoscopy (N/A, 10/24/2014); and Esophagogastroduodenoscopy (N/A, 10/24/2014).   Her family history includes Diabetes in her mother; Hypertension in her father and mother. There is no history of Colon cancer.She reports that she has been smoking Cigarettes.  She has a 25 pack-year smoking history. She has never used smokeless tobacco. She reports that she does not drink alcohol. Her drug history is not on file.  Outpatient Prescriptions Prior to Visit  Medication Sig Dispense Refill  . Calcium Carbonate-Vitamin D (CALCARB 600/D PO) Take 1 tablet by mouth every evening.    . diphenhydrAMINE (BENADRYL) 25 mg capsule Take 25 mg by mouth every 6 (six) hours as needed for itching or allergies.    Marland Kitchen esomeprazole (NEXIUM) 40 MG capsule Take 1 capsule (40 mg total) by mouth daily before breakfast. 30 capsule 11  . KRILL OIL PO Take 1 capsule by mouth every evening.    . latanoprost (XALATAN) 0.005 % ophthalmic solution 1 drop.    . Multiple Vitamin (MULTIVITAMIN WITH MINERALS) TABS tablet Take 1 tablet by mouth daily.    . quinapril (ACCUPRIL) 40 MG tablet TAKE ONE TABLET BY MOUTH ONCE DAILY IN THE MORNING 90 tablet 0  . traMADol (ULTRAM) 50 MG tablet 1 by mouth every 12 hours when necessary, sparingly    . verapamil (CALAN-SR) 240 MG CR tablet TAKE ONE TABLET BY MOUTH IN THE MORNING 90 tablet 0  . ALPRAZolam (XANAX) 0.25 MG tablet Take  0.5-1 tablets (0.125-0.25 mg total) by mouth 3 (three) times daily as needed for anxiety. 90 tablet 0   No facility-administered medications prior to visit.    ROS Review of Systems  Constitutional: Negative for fever, activity change and appetite change.  HENT: Negative for congestion, rhinorrhea and sore throat.   Eyes: Negative for visual disturbance.  Respiratory: Negative for cough and shortness of breath.   Cardiovascular: Negative for chest pain and palpitations.  Gastrointestinal: Negative for nausea, abdominal pain and diarrhea.  Genitourinary: Negative for dysuria.  Musculoskeletal: Negative for myalgias and arthralgias.    Objective:  BP 151/87 mmHg  Pulse 101  Temp(Src) 97.2 F (36.2 C) (Oral)  Ht 5\' 4"  (1.626 m)  Wt 136 lb (61.689 kg)  BMI 23.33 kg/m2  SpO2 97%  BP Readings from Last 3 Encounters:  04/15/15 151/87  03/25/15 132/81  01/24/15 126/73    Wt Readings from Last 3 Encounters:  04/15/15 136 lb (61.689 kg)  03/25/15 136 lb (61.689 kg)  01/24/15 131 lb 6.4 oz (59.603 kg)     Physical Exam  Constitutional: She is oriented to person, place, and time. She appears well-developed and well-nourished. No distress.  HENT:  Head: Normocephalic and atraumatic.  Right Ear: External ear normal.  Left Ear: External ear normal.  Nose: Nose normal.  Mouth/Throat: Oropharynx is clear and moist.  Eyes: Conjunctivae and EOM are normal. Pupils are equal, round, and reactive to light.  Neck: Normal range of motion.  Neck supple. No thyromegaly present.  Cardiovascular: Normal rate, regular rhythm and normal heart sounds.   No murmur heard. Pulmonary/Chest: Effort normal and breath sounds normal. No respiratory distress. She has no wheezes. She has no rales. Right breast exhibits no inverted nipple, no mass and no tenderness. Left breast exhibits no inverted nipple, no mass and no tenderness. Breasts are symmetrical.  Abdominal: Soft. Normal appearance and bowel  sounds are normal. She exhibits no distension, no abdominal bruit and no mass. There is no splenomegaly or hepatomegaly. There is no tenderness. There is no tenderness at McBurney's point and negative Murphy's sign.  Genitourinary:  Rectal declined by pt.   Musculoskeletal: Normal range of motion. She exhibits no edema or tenderness.  Lymphadenopathy:    She has no cervical adenopathy.  Neurological: She is alert and oriented to person, place, and time. She has normal reflexes.  Skin: Skin is warm and dry. No rash noted.  Psychiatric: She has a normal mood and affect. Her behavior is normal. Judgment and thought content normal.     Lab Results  Component Value Date   WBC 13.7* 09/23/2014   HGB 13.5 09/23/2014   HCT 40.4 09/23/2014   PLT 438* 09/23/2014   GLUCOSE 105* 03/25/2015   CHOL 204* 03/25/2015   TRIG 214* 03/25/2015   HDL 41 03/25/2015   LDLCALC 120* 03/25/2015   ALT 15 03/25/2015   AST 18 03/25/2015   NA 142 03/25/2015   K 5.4* 03/25/2015   CL 102 03/25/2015   CREATININE 0.67 03/25/2015   BUN 10 03/25/2015   CO2 22 03/25/2015   TSH 3.770 09/17/2014    No results found.  Assessment & Plan:   Briseis was seen today for breast exam and back pain.  Diagnoses and all orders for this visit:  Neck pain  Essential hypertension  Generalized anxiety disorder  Inconclusive mammogram due to dense breasts -     MM Digital Diagnostic Mondamin; Future  Degenerative disc disease, cervical  Other orders -     Cancel: MM Digital Screening; Future -     ALPRAZolam (XANAX) 0.25 MG tablet; Take 0.5-1 tablets (0.125-0.25 mg total) by mouth 3 (three) times daily as needed for anxiety. -     tiZANidine (ZANAFLEX) 4 MG tablet; Take 1 tablet (4 mg total) by mouth at bedtime. For neck pain  I am having Ms. Formica start on tiZANidine. I am also having her maintain her KRILL OIL PO, Calcium Carbonate-Vitamin D (CALCARB 600/D PO), multivitamin with minerals, diphenhydrAMINE,  esomeprazole, quinapril, verapamil, latanoprost, traMADol, and ALPRAZolam.  Meds ordered this encounter  Medications  . ALPRAZolam (XANAX) 0.25 MG tablet    Sig: Take 0.5-1 tablets (0.125-0.25 mg total) by mouth 3 (three) times daily as needed for anxiety.    Dispense:  90 tablet    Refill:  1  . tiZANidine (ZANAFLEX) 4 MG tablet    Sig: Take 1 tablet (4 mg total) by mouth at bedtime. For neck pain    Dispense:  30 tablet    Refill:  5     Follow-up: No Follow-up on file.  Claretta Fraise, M.D.

## 2015-04-21 ENCOUNTER — Telehealth: Payer: Self-pay | Admitting: Family Medicine

## 2015-04-21 DIAGNOSIS — Z1239 Encounter for other screening for malignant neoplasm of breast: Secondary | ICD-10-CM

## 2015-04-21 NOTE — Telephone Encounter (Signed)
Spoke with pt regarding mamm appt Pt will schedule appt, she needs order faxed Printed order and faxed to [phone] per pt request

## 2015-04-21 NOTE — Telephone Encounter (Signed)
Lacey Christensen I spoke to this pt and she says she is supposed to have a 3-d mammo, she said you knew what she was talking about.

## 2015-05-13 ENCOUNTER — Other Ambulatory Visit: Payer: Self-pay | Admitting: Family Medicine

## 2015-05-14 NOTE — Addendum Note (Signed)
Addended by: Marin Olp on: 05/14/2015 06:00 PM   Modules accepted: Orders

## 2015-05-27 DIAGNOSIS — K315 Obstruction of duodenum: Secondary | ICD-10-CM | POA: Diagnosis not present

## 2015-05-27 DIAGNOSIS — K219 Gastro-esophageal reflux disease without esophagitis: Secondary | ICD-10-CM | POA: Diagnosis not present

## 2015-07-04 DIAGNOSIS — H40053 Ocular hypertension, bilateral: Secondary | ICD-10-CM | POA: Diagnosis not present

## 2015-07-04 DIAGNOSIS — H40033 Anatomical narrow angle, bilateral: Secondary | ICD-10-CM | POA: Diagnosis not present

## 2015-07-16 DIAGNOSIS — Z1231 Encounter for screening mammogram for malignant neoplasm of breast: Secondary | ICD-10-CM | POA: Diagnosis not present

## 2015-07-16 LAB — HM MAMMOGRAPHY

## 2015-07-23 ENCOUNTER — Encounter: Payer: Self-pay | Admitting: *Deleted

## 2015-08-14 ENCOUNTER — Other Ambulatory Visit: Payer: Self-pay | Admitting: Family Medicine

## 2015-08-27 DIAGNOSIS — H401121 Primary open-angle glaucoma, left eye, mild stage: Secondary | ICD-10-CM | POA: Diagnosis not present

## 2015-08-27 DIAGNOSIS — Z79899 Other long term (current) drug therapy: Secondary | ICD-10-CM | POA: Diagnosis not present

## 2015-08-27 DIAGNOSIS — F17210 Nicotine dependence, cigarettes, uncomplicated: Secondary | ICD-10-CM | POA: Diagnosis not present

## 2015-08-27 DIAGNOSIS — H401113 Primary open-angle glaucoma, right eye, severe stage: Secondary | ICD-10-CM | POA: Diagnosis not present

## 2015-08-27 DIAGNOSIS — H2513 Age-related nuclear cataract, bilateral: Secondary | ICD-10-CM | POA: Diagnosis not present

## 2015-08-27 DIAGNOSIS — Z882 Allergy status to sulfonamides status: Secondary | ICD-10-CM | POA: Diagnosis not present

## 2015-08-27 DIAGNOSIS — I10 Essential (primary) hypertension: Secondary | ICD-10-CM | POA: Diagnosis not present

## 2015-10-01 DIAGNOSIS — Z83511 Family history of glaucoma: Secondary | ICD-10-CM | POA: Diagnosis not present

## 2015-10-01 DIAGNOSIS — I10 Essential (primary) hypertension: Secondary | ICD-10-CM | POA: Diagnosis not present

## 2015-10-01 DIAGNOSIS — Z7982 Long term (current) use of aspirin: Secondary | ICD-10-CM | POA: Diagnosis not present

## 2015-10-01 DIAGNOSIS — F17210 Nicotine dependence, cigarettes, uncomplicated: Secondary | ICD-10-CM | POA: Diagnosis not present

## 2015-10-01 DIAGNOSIS — H401113 Primary open-angle glaucoma, right eye, severe stage: Secondary | ICD-10-CM | POA: Diagnosis not present

## 2015-10-01 DIAGNOSIS — H401121 Primary open-angle glaucoma, left eye, mild stage: Secondary | ICD-10-CM | POA: Diagnosis not present

## 2015-10-01 DIAGNOSIS — F329 Major depressive disorder, single episode, unspecified: Secondary | ICD-10-CM | POA: Diagnosis not present

## 2015-10-01 DIAGNOSIS — H2513 Age-related nuclear cataract, bilateral: Secondary | ICD-10-CM | POA: Diagnosis not present

## 2015-10-31 DIAGNOSIS — Z79899 Other long term (current) drug therapy: Secondary | ICD-10-CM | POA: Diagnosis not present

## 2015-10-31 DIAGNOSIS — I10 Essential (primary) hypertension: Secondary | ICD-10-CM | POA: Diagnosis not present

## 2015-10-31 DIAGNOSIS — F17210 Nicotine dependence, cigarettes, uncomplicated: Secondary | ICD-10-CM | POA: Diagnosis not present

## 2015-10-31 DIAGNOSIS — H2513 Age-related nuclear cataract, bilateral: Secondary | ICD-10-CM | POA: Diagnosis not present

## 2015-10-31 DIAGNOSIS — H401121 Primary open-angle glaucoma, left eye, mild stage: Secondary | ICD-10-CM | POA: Diagnosis not present

## 2015-10-31 DIAGNOSIS — H401113 Primary open-angle glaucoma, right eye, severe stage: Secondary | ICD-10-CM | POA: Diagnosis not present

## 2015-10-31 DIAGNOSIS — Z882 Allergy status to sulfonamides status: Secondary | ICD-10-CM | POA: Diagnosis not present

## 2015-11-17 ENCOUNTER — Other Ambulatory Visit: Payer: Self-pay | Admitting: Family Medicine

## 2015-11-28 ENCOUNTER — Other Ambulatory Visit: Payer: Self-pay | Admitting: Family Medicine

## 2015-12-03 ENCOUNTER — Ambulatory Visit (INDEPENDENT_AMBULATORY_CARE_PROVIDER_SITE_OTHER): Payer: Medicare Other | Admitting: Family Medicine

## 2015-12-03 ENCOUNTER — Encounter: Payer: Self-pay | Admitting: Family Medicine

## 2015-12-03 VITALS — BP 132/86 | HR 87 | Temp 97.2°F | Ht 64.0 in | Wt 142.4 lb

## 2015-12-03 DIAGNOSIS — K279 Peptic ulcer, site unspecified, unspecified as acute or chronic, without hemorrhage or perforation: Secondary | ICD-10-CM | POA: Diagnosis not present

## 2015-12-03 DIAGNOSIS — E785 Hyperlipidemia, unspecified: Secondary | ICD-10-CM | POA: Diagnosis not present

## 2015-12-03 DIAGNOSIS — I10 Essential (primary) hypertension: Secondary | ICD-10-CM | POA: Diagnosis not present

## 2015-12-03 DIAGNOSIS — F411 Generalized anxiety disorder: Secondary | ICD-10-CM

## 2015-12-03 MED ORDER — ALPRAZOLAM 0.25 MG PO TABS: 0.1250 mg | ORAL_TABLET | Freq: Three times a day (TID) | ORAL | 1 refills | Status: DC | PRN

## 2015-12-03 NOTE — Progress Notes (Signed)
Subjective:  Patient ID: Lacey Christensen, female    DOB: 01-30-1946  Age: 70 y.o. MRN: [id number]  CC: Medication Refill (pt here for refill on her xanax)   HPI Lacey Christensen presents for  follow-up of hypertension. Patient has no history of headache chest pain or shortness of breath or recent cough. Patient also denies symptoms of TIA such as numbness weakness lateralizing. Patient checks  blood pressure at home and has not had any elevated readings recently. Patient denies side effects from his medication. States taking it regularly. Still taking the Krill oil as directed.. Defers checking cholesterol today since her physical is coming up in December.  Patient states that the circumstances over losing her dog recently and various other events have continued to make her anxious. She says she just gets all wound up and angry. Taking half of a Xanax tablet seems to release that pretty well. She never takes more than half a pill 3 times a day. She still takes it on an as-needed basis. Symptoms are overall stable.     History Lacey Christensen has a past medical history of Hypertension; Narrow angle glaucoma suspect of both eyes; and PUD (peptic ulcer disease).   She has a past surgical history that includes dilitation and curritage; Colonoscopy (N/A, 10/24/2014); and Esophagogastroduodenoscopy (N/A, 10/24/2014).   Her family history includes Diabetes in her mother; Hypertension in her father and mother.She reports that she has been smoking Cigarettes.  She has a 25.00 pack-year smoking history. She has never used smokeless tobacco. She reports that she does not drink alcohol. Her drug history is not on file.    ROS Review of Systems  Constitutional: Negative for activity change, appetite change and fever.  HENT: Negative for congestion, rhinorrhea and sore throat.   Eyes: Negative for visual disturbance.  Respiratory: Negative for cough and shortness of breath.   Cardiovascular: Negative for chest pain  and palpitations.  Gastrointestinal: Negative for abdominal pain, diarrhea and nausea.  Genitourinary: Negative for dysuria.  Musculoskeletal: Negative for arthralgias and myalgias.  Psychiatric/Behavioral: Positive for agitation (with anxiety episodes).    Objective:  BP 132/86   Pulse 87   Temp 97.2 F (36.2 C) (Oral)   Ht 5\' 4"  (1.626 m)   Wt 142 lb 6 oz (64.6 kg)   BMI 24.44 kg/m   BP Readings from Last 3 Encounters:  12/03/15 132/86  04/15/15 (!) 151/87  03/25/15 132/81    Wt Readings from Last 3 Encounters:  12/03/15 142 lb 6 oz (64.6 kg)  04/15/15 136 lb (61.7 kg)  03/25/15 136 lb (61.7 kg)     Physical Exam  Constitutional: She is oriented to person, place, and time. She appears well-developed and well-nourished. No distress.  HENT:  Head: Normocephalic and atraumatic.  Eyes: Conjunctivae are normal. Pupils are equal, round, and reactive to light.  Neck: Normal range of motion. Neck supple. No thyromegaly present.  Cardiovascular: Normal rate, regular rhythm and normal heart sounds.   No murmur heard. Pulmonary/Chest: Effort normal and breath sounds normal. No respiratory distress. She has no wheezes. She has no rales.  Abdominal: Soft. There is no tenderness.  Musculoskeletal: Normal range of motion.  Lymphadenopathy:    She has no cervical adenopathy.  Neurological: She is alert and oriented to person, place, and time.  Skin: Skin is warm and dry.  Psychiatric: She has a normal mood and affect. Her behavior is normal. Judgment and thought content normal.     Lab Results  Component Value  Date   WBC 13.7 (H) 09/23/2014   HGB 13.5 09/23/2014   HCT 40.4 09/23/2014   PLT 438 (H) 09/23/2014   GLUCOSE 105 (H) 03/25/2015   CHOL 204 (H) 03/25/2015   TRIG 214 (H) 03/25/2015   HDL 41 03/25/2015   LDLCALC 120 (H) 03/25/2015   ALT 15 03/25/2015   AST 18 03/25/2015   NA 142 03/25/2015   K 5.4 (H) 03/25/2015   CL 102 03/25/2015   CREATININE 0.67 03/25/2015     BUN 10 03/25/2015   CO2 22 03/25/2015   TSH 3.770 09/17/2014    No results found.  Assessment & Plan:   Lacey Christensen was seen today for medication refill.  Diagnoses and all orders for this visit:  Essential hypertension  Generalized anxiety disorder  Hyperlipidemia  PUD (peptic ulcer disease)  Other orders -     ALPRAZolam (XANAX) 0.25 MG tablet; Take 0.5 tablets (0.125 mg total) by mouth 3 (three) times daily as needed for anxiety.    Pt. Defers blood work until [employer] for 12/17  I have discontinued Lacey Christensen's multivitamin with minerals and traMADol. I have also changed her ALPRAZolam. Additionally, I am having her maintain her KRILL OIL PO, Calcium Carbonate-Vitamin D (CALCARB 600/D PO), diphenhydrAMINE, esomeprazole, latanoprost, tiZANidine, verapamil, and quinapril.     Follow-up: Return in about 3 months (around 03/03/2016) for Wellness, cholesterol, hypertension.  Claretta Fraise, M.D.

## 2016-02-12 ENCOUNTER — Other Ambulatory Visit: Payer: Self-pay | Admitting: Family Medicine

## 2016-02-13 DIAGNOSIS — H401121 Primary open-angle glaucoma, left eye, mild stage: Secondary | ICD-10-CM | POA: Diagnosis not present

## 2016-02-13 DIAGNOSIS — H401113 Primary open-angle glaucoma, right eye, severe stage: Secondary | ICD-10-CM | POA: Diagnosis not present

## 2016-02-13 DIAGNOSIS — H25813 Combined forms of age-related cataract, bilateral: Secondary | ICD-10-CM | POA: Diagnosis not present

## 2016-02-15 DIAGNOSIS — H25813 Combined forms of age-related cataract, bilateral: Secondary | ICD-10-CM | POA: Insufficient documentation

## 2016-03-01 ENCOUNTER — Telehealth: Payer: Self-pay | Admitting: Family Medicine

## 2016-03-01 DIAGNOSIS — K219 Gastro-esophageal reflux disease without esophagitis: Secondary | ICD-10-CM

## 2016-03-01 DIAGNOSIS — I10 Essential (primary) hypertension: Secondary | ICD-10-CM

## 2016-03-01 DIAGNOSIS — R5383 Other fatigue: Secondary | ICD-10-CM

## 2016-03-01 DIAGNOSIS — E7849 Other hyperlipidemia: Secondary | ICD-10-CM

## 2016-03-01 DIAGNOSIS — R7989 Other specified abnormal findings of blood chemistry: Secondary | ICD-10-CM

## 2016-03-01 NOTE — Telephone Encounter (Signed)
Pt notified of lab order.

## 2016-03-02 ENCOUNTER — Other Ambulatory Visit: Payer: Medicare Other

## 2016-03-02 DIAGNOSIS — R5383 Other fatigue: Secondary | ICD-10-CM

## 2016-03-02 DIAGNOSIS — R7989 Other specified abnormal findings of blood chemistry: Secondary | ICD-10-CM

## 2016-03-02 DIAGNOSIS — K219 Gastro-esophageal reflux disease without esophagitis: Secondary | ICD-10-CM

## 2016-03-02 DIAGNOSIS — I10 Essential (primary) hypertension: Secondary | ICD-10-CM

## 2016-03-02 DIAGNOSIS — E7849 Other hyperlipidemia: Secondary | ICD-10-CM

## 2016-03-02 DIAGNOSIS — E559 Vitamin D deficiency, unspecified: Secondary | ICD-10-CM | POA: Diagnosis not present

## 2016-03-02 DIAGNOSIS — E784 Other hyperlipidemia: Secondary | ICD-10-CM | POA: Diagnosis not present

## 2016-03-03 LAB — CMP14+EGFR
ALK PHOS: 82 IU/L (ref 39–117)
ALT: 19 IU/L (ref 0–32)
AST: 16 IU/L (ref 0–40)
Albumin/Globulin Ratio: 1.4 (ref 1.2–2.2)
Albumin: 4.2 g/dL (ref 3.5–4.8)
BILIRUBIN TOTAL: 0.3 mg/dL (ref 0.0–1.2)
BUN/Creatinine Ratio: 14 (ref 12–28)
BUN: 11 mg/dL (ref 8–27)
CHLORIDE: 103 mmol/L (ref 96–106)
CO2: 24 mmol/L (ref 18–29)
CREATININE: 0.8 mg/dL (ref 0.57–1.00)
Calcium: 10.1 mg/dL (ref 8.7–10.3)
GFR calc Af Amer: 86 mL/min/{1.73_m2} (ref >59)
GFR calc non Af Amer: 75 mL/min/{1.73_m2} (ref >59)
GLUCOSE: 97 mg/dL (ref 65–99)
Globulin, Total: 3 g/dL (ref 1.5–4.5)
Potassium: 5.4 mmol/L — ABNORMAL HIGH (ref 3.5–5.2)
Sodium: 142 mmol/L (ref 134–144)
Total Protein: 7.2 g/dL (ref 6.0–8.5)

## 2016-03-03 LAB — CBC WITH DIFFERENTIAL/PLATELET
BASOS ABS: 0.1 10*3/uL (ref 0.0–0.2)
Basos: 1 %
EOS (ABSOLUTE): 1.3 10*3/uL — ABNORMAL HIGH (ref 0.0–0.4)
Eos: 15 %
Hematocrit: 42.2 % (ref 34.0–46.6)
Hemoglobin: 13.6 g/dL (ref 11.1–15.9)
IMMATURE GRANS (ABS): 0 10*3/uL (ref 0.0–0.1)
IMMATURE GRANULOCYTES: 0 %
LYMPHS: 28 %
Lymphocytes Absolute: 2.4 10*3/uL (ref 0.7–3.1)
MCH: 30.8 pg (ref 26.6–33.0)
MCHC: 32.2 g/dL (ref 31.5–35.7)
MCV: 96 fL (ref 79–97)
MONOCYTES: 9 %
Monocytes Absolute: 0.8 10*3/uL (ref 0.1–0.9)
Neutrophils Absolute: 4 10*3/uL (ref 1.4–7.0)
Neutrophils: 47 %
PLATELETS: 481 10*3/uL — AB (ref 150–379)
RBC: 4.42 x10E6/uL (ref 3.77–5.28)
RDW: 14.5 % (ref 12.3–15.4)
WBC: 8.5 10*3/uL (ref 3.4–10.8)

## 2016-03-03 LAB — TSH+FREE T4
Free T4: 0.9 ng/dL (ref 0.82–1.77)
TSH: 3.25 u[IU]/mL (ref 0.450–4.500)

## 2016-03-03 LAB — LIPID PANEL
CHOLESTEROL TOTAL: 212 mg/dL — AB (ref 100–199)
Chol/HDL Ratio: 5.6 ratio units — ABNORMAL HIGH (ref 0.0–4.4)
HDL: 38 mg/dL — AB (ref >39)
LDL CALC: 124 mg/dL — AB (ref 0–99)
TRIGLYCERIDES: 250 mg/dL — AB (ref 0–149)
VLDL CHOLESTEROL CAL: 50 mg/dL — AB (ref 5–40)

## 2016-03-03 LAB — VITAMIN D 25 HYDROXY (VIT D DEFICIENCY, FRACTURES): Vit D, 25-Hydroxy: 27.7 ng/mL — ABNORMAL LOW (ref 30.0–100.0)

## 2016-03-07 NOTE — Progress Notes (Signed)
Subjective:   Lacey Christensen is a 70 y.o. female who presents for  Medicare Annual Wellness Visit.  Review of Systems  Review of Systems  Constitutional: Negative for chills, diaphoresis, fever, malaise/fatigue and weight loss.  HENT: Negative for congestion, ear pain, hearing loss, nosebleeds, sore throat and tinnitus.   Eyes: Negative for blurred vision, double vision, photophobia, pain, discharge and redness.  Respiratory: Negative for cough, hemoptysis, sputum production, shortness of breath and wheezing.   Cardiovascular: Negative for chest pain, palpitations, orthopnea, leg swelling and PND.  Gastrointestinal: Negative for abdominal pain, blood in stool, constipation, diarrhea, heartburn, melena, nausea and vomiting.  Genitourinary: Negative for dysuria, flank pain, frequency, hematuria and urgency.  Musculoskeletal: Negative for back pain, falls, myalgias and neck pain.  Skin: Negative for itching and rash.  Neurological: Negative for dizziness, tingling, tremors, sensory change, speech change, focal weakness, seizures, loss of consciousness, weakness and headaches.  Endo/Heme/Allergies: Negative for environmental allergies and polydipsia. Does not bruise/bleed easily.  Psychiatric/Behavioral: Negative for depression, hallucinations, memory loss, substance abuse and suicidal ideas. The patient is not nervous/anxious and does not have insomnia.      Current Medications (verified) Outpatient Encounter Prescriptions as of 03/08/2016  Medication Sig  . ALPRAZolam (XANAX) 0.25 MG tablet Take 0.5 tablets (0.125 mg total) by mouth 3 (three) times daily as needed for anxiety.  . Calcium Carbonate-Vitamin D (CALCARB 600/D PO) Take 1 tablet by mouth every evening.  . diphenhydrAMINE (BENADRYL) 25 mg capsule Take 25 mg by mouth every 6 (six) hours as needed for itching or allergies.  Marland Kitchen dorzolamide (TRUSOPT) 2 % ophthalmic solution PLACE 1 DROP INTO THE RIGHT EYE 2 TIMES DAILY.  Marland Kitchen  esomeprazole (NEXIUM) 40 MG capsule Take 1 capsule (40 mg total) by mouth daily before breakfast.  . latanoprost (XALATAN) 0.005 % ophthalmic solution 1 drop.  . quinapril (ACCUPRIL) 40 MG tablet TAKE ONE TABLET BY MOUTH ONCE DAILY IN THE MORNING  . tiZANidine (ZANAFLEX) 4 MG tablet Take 1 tablet (4 mg total) by mouth at bedtime. For neck pain  . verapamil (CALAN-SR) 240 MG CR tablet TAKE ONE TABLET BY MOUTH ONCE DAILY IN THE MORNING  . [DISCONTINUED] KRILL OIL PO Take 1 capsule by mouth every evening.  . Cholecalciferol (VITAMIN D) 2000 units tablet Take 1 tablet (2,000 Units total) by mouth daily.  Javier Docker Oil 300 MG CAPS Take 2 capsules (600 mg total) by mouth 2 (two) times daily.   No facility-administered encounter medications on file as of 03/08/2016.     Allergies (verified) Corn-containing products; Sulfa antibiotics; Timolol; and Tape   History:  Patient Active Problem List   Diagnosis Date Noted  . Inconclusive mammogram due to dense breasts 04/15/2015  . Degenerative disc disease, cervical 04/15/2015  . Hyperlipidemia 03/25/2015  . Neck pain 03/25/2015  . Essential hypertension 03/25/2015  . Generalized anxiety disorder 03/25/2015  . Glaucoma 03/25/2015  . Duodenal stricture 01/24/2015  . PUD (peptic ulcer disease)     Past Medical History:  Diagnosis Date  . Hypertension   . Narrow angle glaucoma suspect of both eyes   . PUD (peptic ulcer disease)    12 years ago, EGD at [facility] in [place]   Past Surgical History:  Procedure Laterality Date  . COLONOSCOPY N/A 10/24/2014   SLF: 1. Angulated rectosigmoid junction 2. Two rectal polyps removed 3. Small internal hemorrhoids   . dilitation and curritage    . ESOPHAGOGASTRODUODENOSCOPY N/A 10/24/2014   SLF: 1. Schatzki's  ring at the gastroesophageal junction 2. Deformed pylous and superficial pyloric ulcer 3. Non-erosive gastritis ( inflammation) was found in the gastric antrum; multiple biopsies  were performed. 4. Stricture at the junction of D!/D@   Family History  Problem Relation Age of Onset  . Diabetes Mother   . Hypertension Mother   . Hypertension Father   . Colon cancer Neg Hx    Social History   Occupational History  . Not on file.   Social History Main Topics  . Smoking status: Current Every Day Smoker    Packs/day: 0.50    Years: 50.00    Types: Cigarettes  . Smokeless tobacco: Never Used  . Alcohol use No  . Drug use: Unknown  . Sexual activity: Yes    Birth control/ protection: Post-menopausal    Do you feel safe at home?  Yes Are there smokers in your home (other than you)? No  Dietary issues and exercise activities:    Current Dietary habits:  Regular, low fat   Objective:    Today's Vitals   03/08/16 1133  BP: 116/62  Pulse: 75  Temp: 97 F (36.1 C)  TempSrc: Oral  Weight: 148 lb (67.1 kg)  Height: 5\' 5"  (1.651 m)   Body mass index is 24.63 kg/m.  Activities of Daily Living No flowsheet data found.      Depression Screen PHQ 2/9 Scores 03/08/2016 12/03/2015 04/15/2015 03/25/2015  PHQ - 2 Score 4 0 0 0  PHQ- 9 Score 11 - - -     Fall Risk Fall Risk  03/08/2016 12/03/2015 04/15/2015 03/25/2015 08/01/2014  Falls in the past year? No No No No No   Physical Exam  Constitutional: She is oriented to person, place, and time. She appears well-developed and well-nourished. No distress.  HENT:  Head: Normocephalic and atraumatic.  Right Ear: External ear normal.  Left Ear: External ear normal.  Nose: Nose normal.  Mouth/Throat: Oropharynx is clear and moist. No oropharyngeal exudate.  Eyes: Conjunctivae and EOM are normal. Pupils are equal, round, and reactive to light. Right eye exhibits no discharge. Left eye exhibits no discharge. No scleral icterus.  Neck: Normal range of motion. Neck supple. No JVD present. No thyromegaly present.  Cardiovascular: Normal rate, regular rhythm, normal heart sounds and intact distal pulses.  Exam  reveals no gallop and no friction rub.   No murmur heard. Pulmonary/Chest: Effort normal and breath sounds normal. No stridor. No respiratory distress. She has no wheezes. She has no rales. She exhibits no tenderness.  Abdominal: Soft. Bowel sounds are normal. There is no tenderness.  Musculoskeletal: Normal range of motion.  Lymphadenopathy:    She has no cervical adenopathy.  Neurological: She is alert and oriented to person, place, and time. She displays normal reflexes. No cranial nerve deficit. She exhibits normal muscle tone. Coordination normal.  Skin: Skin is warm and dry. She is not diaphoretic.  Psychiatric: She has a normal mood and affect. Her behavior is normal.  Vitals reviewed.   Cognitive Function: No flowsheet data found.  Immunizations and Health Maintenance Immunization History  Administered Date(s) Administered  . Pneumococcal Conjugate-13 04/15/2015   Health Maintenance Due  Topic Date Due  . Hepatitis C Screening  11-Oct-1945  . ZOSTAVAX  11/08/2005    Patient Care Team: Claretta Fraise, MD as PCP - General (Family Medicine) Danie Binder, MD as Consulting Physician (Gastroenterology)  Indicate any recent Medical Services you may have received from other than [facility] providers in the  past year (date may be approximate).    Assessment:    Annual Wellness Visit    Screening Tests Health Maintenance  Topic Date Due  . Hepatitis C Screening  1945/05/09  . ZOSTAVAX  11/08/2005  . INFLUENZA VACCINE  06/26/2016 (Originally 10/28/2015)  . MAMMOGRAM  07/15/2017  . TETANUS/TDAP  03/24/2022  . COLONOSCOPY  10/23/2024  . DEXA SCAN  Completed  . PNA vac Low Risk Adult  Completed        Plan:   During the course of the visit Shunte was educated and counseled about the following appropriate screening and preventive services:   Vaccines to include Pneumoccal, Influenza, Td, Zostavax, Pt. Declines flu & zostavax injections. Pneumovax given July 2014. Booster doe  July 2019.  Colorectal cancer screening  Cardiovascular disease screening  Diabetes screening  Bone Denisty / Osteoporosis Screening  Mammogram - offered MRI due to dense breast tissue  PAP  Glaucoma screening   Nutrition counseling  Smoking cessation counseling   Advanced Directives  Physical Activity   Goals    . Reduce fat intake to X grams per day        Patient Instructions (the written plan) were given to the patient.   Claretta Fraise, MD   03/08/2016

## 2016-03-08 ENCOUNTER — Other Ambulatory Visit: Payer: Self-pay | Admitting: Family Medicine

## 2016-03-08 ENCOUNTER — Encounter: Payer: Self-pay | Admitting: Family Medicine

## 2016-03-08 ENCOUNTER — Ambulatory Visit (INDEPENDENT_AMBULATORY_CARE_PROVIDER_SITE_OTHER): Payer: Medicare Other | Admitting: Family Medicine

## 2016-03-08 VITALS — BP 116/62 | HR 75 | Temp 97.0°F | Ht 65.0 in | Wt 148.0 lb

## 2016-03-08 DIAGNOSIS — M5030 Other cervical disc degeneration, unspecified cervical region: Secondary | ICD-10-CM

## 2016-03-08 DIAGNOSIS — E782 Mixed hyperlipidemia: Secondary | ICD-10-CM

## 2016-03-08 DIAGNOSIS — Z0000 Encounter for general adult medical examination without abnormal findings: Secondary | ICD-10-CM | POA: Diagnosis not present

## 2016-03-08 DIAGNOSIS — I10 Essential (primary) hypertension: Secondary | ICD-10-CM

## 2016-03-08 DIAGNOSIS — F411 Generalized anxiety disorder: Secondary | ICD-10-CM

## 2016-03-08 MED ORDER — KRILL OIL 300 MG PO CAPS
2.0000 | ORAL_CAPSULE | Freq: Two times a day (BID) | ORAL | 11 refills | Status: AC
Start: 2016-03-08 — End: ?

## 2016-03-08 MED ORDER — VITAMIN D 50 MCG (2000 UT) PO TABS: 2000.0000 [IU] | ORAL_TABLET | Freq: Every day | ORAL | 11 refills | Status: AC

## 2016-05-10 ENCOUNTER — Telehealth: Payer: Self-pay | Admitting: Family Medicine

## 2016-05-10 MED ORDER — VERAPAMIL HCL ER 240 MG PO TBCR: EXTENDED_RELEASE_TABLET | ORAL | 0 refills | Status: DC

## 2016-05-10 MED ORDER — QUINAPRIL HCL 40 MG PO TABS: ORAL_TABLET | ORAL | 0 refills | Status: DC

## 2016-05-10 NOTE — Telephone Encounter (Signed)
done

## 2016-05-14 ENCOUNTER — Other Ambulatory Visit: Payer: Self-pay | Admitting: Family Medicine

## 2016-05-19 ENCOUNTER — Other Ambulatory Visit: Payer: Self-pay | Admitting: Family Medicine

## 2016-05-19 ENCOUNTER — Telehealth: Payer: Self-pay | Admitting: Family Medicine

## 2016-05-19 NOTE — Telephone Encounter (Signed)
Last filled 04/12/16, last seen 03/08/16. Call in

## 2016-05-19 NOTE — Telephone Encounter (Signed)
[facility] 

## 2016-05-19 NOTE — Telephone Encounter (Signed)
Alprazolam 0.25 mg, 1/2 tab tid prn anxiety #45 with 1 refill was called [facility] per Dr. Livia Snellen okay on 05/14/16.  [facility]  Patient informed.

## 2016-06-17 DIAGNOSIS — H401121 Primary open-angle glaucoma, left eye, mild stage: Secondary | ICD-10-CM | POA: Diagnosis not present

## 2016-06-17 DIAGNOSIS — H401113 Primary open-angle glaucoma, right eye, severe stage: Secondary | ICD-10-CM | POA: Diagnosis not present

## 2016-07-19 ENCOUNTER — Other Ambulatory Visit: Payer: Self-pay | Admitting: Family Medicine

## 2016-07-20 NOTE — Telephone Encounter (Signed)
Last filled 06/18/16, last seen 03/08/16. [facility] if approved

## 2016-07-21 NOTE — Telephone Encounter (Signed)
[facility] 

## 2016-08-05 DIAGNOSIS — K219 Gastro-esophageal reflux disease without esophagitis: Secondary | ICD-10-CM | POA: Diagnosis not present

## 2016-08-05 DIAGNOSIS — K315 Obstruction of duodenum: Secondary | ICD-10-CM | POA: Diagnosis not present

## 2016-08-07 ENCOUNTER — Other Ambulatory Visit: Payer: Self-pay | Admitting: Family Medicine

## 2016-10-14 ENCOUNTER — Ambulatory Visit (INDEPENDENT_AMBULATORY_CARE_PROVIDER_SITE_OTHER): Payer: Medicare Other | Admitting: Family Medicine

## 2016-10-14 ENCOUNTER — Encounter: Payer: Self-pay | Admitting: Family Medicine

## 2016-10-14 VITALS — BP 107/67 | HR 73 | Ht 65.0 in | Wt 144.6 lb

## 2016-10-14 DIAGNOSIS — R55 Syncope and collapse: Secondary | ICD-10-CM | POA: Diagnosis not present

## 2016-10-14 NOTE — Progress Notes (Signed)
   Subjective:    Patient ID: [name], female    DOB: 08/09/1945, 71 y.o.   MRN: [id number]  HPI 71 year old female who had a syncopal episode. She had attended a funeral and was at great side service outside. She had been standing on her feet for about 45 minutes in the hot sun and severed the syncopal episode. There was some jerking of her extremities. She lost consciousness for about 20 seconds. There was no incontinence, tongue biting, there is no history of seizure disorder. She denied dizziness or palpitations. There is no history of diabetes or hypoglycemia. Presently, she feels well.  Patient Active Problem List   Diagnosis Date Noted  . Inconclusive mammogram due to dense breasts 04/15/2015  . Degenerative disc disease, cervical 04/15/2015  . Hyperlipidemia 03/25/2015  . Neck pain 03/25/2015  . Essential hypertension 03/25/2015  . Generalized anxiety disorder 03/25/2015  . Glaucoma 03/25/2015  . Duodenal stricture 01/24/2015  . PUD (peptic ulcer disease)    Outpatient Encounter Prescriptions as of 10/14/2016  Medication Sig  . ALPRAZolam (XANAX) 0.25 MG tablet TAKE 1/2 TABLET 3 TIMES A DAY AS NEEDED FOR ANXIETY  . Calcium Carbonate-Vitamin D (CALCARB 600/D PO) Take 1 tablet by mouth every evening.  . Cholecalciferol (VITAMIN D) 2000 units tablet Take 1 tablet (2,000 Units total) by mouth daily.  . diphenhydrAMINE (BENADRYL) 25 mg capsule Take 25 mg by mouth every 6 (six) hours as needed for itching or allergies.  Marland Kitchen dorzolamide (TRUSOPT) 2 % ophthalmic solution PLACE 1 DROP INTO THE RIGHT EYE 2 TIMES DAILY.  Marland Kitchen esomeprazole (NEXIUM) 40 MG capsule Take 1 capsule (40 mg total) by mouth daily before breakfast.  . Krill Oil 300 MG CAPS Take 2 capsules (600 mg total) by mouth 2 (two) times daily.  Marland Kitchen latanoprost (XALATAN) 0.005 % ophthalmic solution 1 drop.  . quinapril (ACCUPRIL) 40 MG tablet TAKE ONE TABLET BY MOUTH ONCE DAILY IN THE MORNING  . tiZANidine (ZANAFLEX) 4 MG tablet  Take 1 tablet (4 mg total) by mouth at bedtime. For neck pain  . verapamil (CALAN-SR) 240 MG CR tablet TAKE ONE TABLET BY MOUTH ONCE DAILY IN THE MORNING   No facility-administered encounter medications on file as of 10/14/2016.       Review of Systems  Neurological: Positive for syncope.  All other systems reviewed and are negative.      Objective:   Physical Exam  Constitutional: She is oriented to person, place, and time. She appears well-developed and well-nourished.  Cardiovascular: Normal rate, regular rhythm, normal heart sounds and intact distal pulses.   Pulmonary/Chest: Effort normal and breath sounds normal.  Neurological: She is alert and oriented to person, place, and time. She has normal reflexes. She displays normal reflexes. She exhibits normal muscle tone. Coordination normal.   BP 107/67   Pulse 73   Ht 5\' 5"  (1.651 m)   Wt 144 lb 9.6 oz (65.6 kg)   SpO2 98%   BMI 24.06 kg/m    EKG shows normal sinus rhythm without other changes.     Assessment & Plan:  Syncopal episode. I think this was probably related to prolonged standing and heat. Neurologic and cardiac exams are normal. Patient reassured. Encouraged to rest today hydrate and activity as tolerated thereafter

## 2016-10-14 NOTE — Progress Notes (Signed)
Normal tracing. No acute changes

## 2016-10-27 DIAGNOSIS — H401121 Primary open-angle glaucoma, left eye, mild stage: Secondary | ICD-10-CM | POA: Diagnosis not present

## 2016-10-27 DIAGNOSIS — H401113 Primary open-angle glaucoma, right eye, severe stage: Secondary | ICD-10-CM | POA: Diagnosis not present

## 2016-11-02 ENCOUNTER — Other Ambulatory Visit: Payer: Self-pay | Admitting: Family Medicine

## 2016-11-06 ENCOUNTER — Other Ambulatory Visit: Payer: Self-pay | Admitting: Family Medicine

## 2016-12-06 ENCOUNTER — Other Ambulatory Visit: Payer: Self-pay | Admitting: Family Medicine

## 2016-12-08 ENCOUNTER — Ambulatory Visit (INDEPENDENT_AMBULATORY_CARE_PROVIDER_SITE_OTHER): Payer: Medicare Other | Admitting: Family Medicine

## 2016-12-08 ENCOUNTER — Encounter: Payer: Self-pay | Admitting: Family Medicine

## 2016-12-08 VITALS — BP 129/79 | HR 92 | Temp 97.7°F | Ht 65.0 in | Wt 145.0 lb

## 2016-12-08 DIAGNOSIS — R7989 Other specified abnormal findings of blood chemistry: Secondary | ICD-10-CM

## 2016-12-08 DIAGNOSIS — E559 Vitamin D deficiency, unspecified: Secondary | ICD-10-CM

## 2016-12-08 DIAGNOSIS — I10 Essential (primary) hypertension: Secondary | ICD-10-CM

## 2016-12-08 DIAGNOSIS — E782 Mixed hyperlipidemia: Secondary | ICD-10-CM

## 2016-12-08 DIAGNOSIS — Z780 Asymptomatic menopausal state: Secondary | ICD-10-CM | POA: Diagnosis not present

## 2016-12-08 LAB — CBC WITH DIFFERENTIAL/PLATELET
BASOS ABS: 0.1 10*3/uL (ref 0.0–0.2)
Basos: 2 %
EOS (ABSOLUTE): 0.7 10*3/uL — ABNORMAL HIGH (ref 0.0–0.4)
EOS: 8 %
HEMATOCRIT: 42.1 % (ref 34.0–46.6)
HEMOGLOBIN: 13.9 g/dL (ref 11.1–15.9)
IMMATURE GRANS (ABS): 0 10*3/uL (ref 0.0–0.1)
Immature Granulocytes: 0 %
LYMPHS: 31 %
Lymphocytes Absolute: 2.7 10*3/uL (ref 0.7–3.1)
MCH: 31.4 pg (ref 26.6–33.0)
MCHC: 33 g/dL (ref 31.5–35.7)
MCV: 95 fL (ref 79–97)
Monocytes Absolute: 0.8 10*3/uL (ref 0.1–0.9)
Monocytes: 10 %
NEUTROS ABS: 4.4 10*3/uL (ref 1.4–7.0)
NEUTROS PCT: 49 %
Platelets: 499 10*3/uL — ABNORMAL HIGH (ref 150–379)
RBC: 4.43 x10E6/uL (ref 3.77–5.28)
RDW: 14.2 % (ref 12.3–15.4)
WBC: 8.7 10*3/uL (ref 3.4–10.8)

## 2016-12-08 LAB — LIPID PANEL
CHOL/HDL RATIO: 6.8 ratio — AB (ref 0.0–4.4)
Cholesterol, Total: 237 mg/dL — ABNORMAL HIGH (ref 100–199)
HDL: 35 mg/dL — ABNORMAL LOW (ref >39)
LDL CALC: 135 mg/dL — AB (ref 0–99)
Triglycerides: 333 mg/dL — ABNORMAL HIGH (ref 0–149)
VLDL CHOLESTEROL CAL: 67 mg/dL — AB (ref 5–40)

## 2016-12-08 LAB — CMP14+EGFR
ALBUMIN: 4.5 g/dL (ref 3.5–4.8)
ALT: 23 IU/L (ref 0–32)
AST: 22 IU/L (ref 0–40)
Albumin/Globulin Ratio: 1.5 (ref 1.2–2.2)
Alkaline Phosphatase: 73 IU/L (ref 39–117)
BUN / CREAT RATIO: 13 (ref 12–28)
BUN: 11 mg/dL (ref 8–27)
Bilirubin Total: 0.3 mg/dL (ref 0.0–1.2)
CO2: 22 mmol/L (ref 20–29)
CREATININE: 0.82 mg/dL (ref 0.57–1.00)
Calcium: 10.5 mg/dL — ABNORMAL HIGH (ref 8.7–10.3)
Chloride: 102 mmol/L (ref 96–106)
GFR calc non Af Amer: 72 mL/min/{1.73_m2} (ref >59)
GFR, EST AFRICAN AMERICAN: 83 mL/min/{1.73_m2} (ref >59)
GLUCOSE: 100 mg/dL — AB (ref 65–99)
Globulin, Total: 3.1 g/dL (ref 1.5–4.5)
Potassium: 5.2 mmol/L (ref 3.5–5.2)
Sodium: 139 mmol/L (ref 134–144)
TOTAL PROTEIN: 7.6 g/dL (ref 6.0–8.5)

## 2016-12-08 MED ORDER — TIZANIDINE HCL 4 MG PO TABS: 4.0000 mg | ORAL_TABLET | Freq: Every day | ORAL | 5 refills | Status: DC

## 2016-12-08 MED ORDER — ALPRAZOLAM 0.25 MG PO TABS: ORAL_TABLET | ORAL | 5 refills | Status: DC

## 2016-12-08 MED ORDER — QUINAPRIL HCL 40 MG PO TABS: ORAL_TABLET | ORAL | 5 refills | Status: DC

## 2016-12-08 MED ORDER — VERAPAMIL HCL ER 240 MG PO TBCR: EXTENDED_RELEASE_TABLET | ORAL | 5 refills | Status: DC

## 2016-12-08 NOTE — Progress Notes (Signed)
Subjective:  Patient ID: Lacey Christensen,  female    DOB: 1945-04-07  Age: 71 y.o.    CC: Hypertension (pt here today for routine follow up of her chronic medical conditions and also c/o congestion and pressure in ears. She states "my whole head just feels kind of tight")   HPI [name] presents for  follow-up of hypertension. Patient has no history of headache chest pain or shortness of breath or recent cough. Patient also denies symptoms of TIA such as numbness weakness lateralizing. Patient checks  blood pressure at home. Recent readings have been good Patient denies side effects from medication. States taking it regularly.  Patient also  in for follow-up of elevated cholesterol. Doing well without complaints on current medication. Denies side effects of statin including myalgia and arthralgia and nausea. Also in today for liver function testing. Currently no chest pain, shortness of breath or other cardiovascular related symptoms noted.  Patient in for follow-up of GERD. Currently asymptomatic. There is no chest pain or heartburn. No hematemesis and no melena. No dysphagia or choking. Onset is remote. Progression is stable. Complicating factors, none.She has had some joint pains so she decreased the nexium to 20 mg qod and sx went away. No rebound of reflux noted so far.    History [name] has a past medical history of Hypertension; Narrow angle glaucoma suspect of both eyes; and PUD (peptic ulcer disease).   She has a past surgical history that includes dilitation and curritage; Colonoscopy (N/A, 10/24/2014); and Esophagogastroduodenoscopy (N/A, 10/24/2014).   Her family history includes Diabetes in her mother; Hypertension in her father and mother.She reports that she has been smoking Cigarettes.  She has a 25.00 pack-year smoking history. She has never used smokeless tobacco. She reports that she does not drink alcohol. Her drug history is not on file.  Current Outpatient Prescriptions  on File Prior to Visit  Medication Sig Dispense Refill  . Calcium Carbonate-Vitamin D (CALCARB 600/D PO) Take 1 tablet by mouth every evening.    . Cholecalciferol (VITAMIN D) 2000 units tablet Take 1 tablet (2,000 Units total) by mouth daily. 30 tablet 11  . diphenhydrAMINE (BENADRYL) 25 mg capsule Take 25 mg by mouth every 6 (six) hours as needed for itching or allergies.    Marland Kitchen dorzolamide (TRUSOPT) 2 % ophthalmic solution PLACE 1 DROP INTO THE RIGHT EYE 2 TIMES DAILY.  11  . Krill Oil 300 MG CAPS Take 2 capsules (600 mg total) by mouth 2 (two) times daily. 120 capsule 11  . latanoprost (XALATAN) 0.005 % ophthalmic solution 1 drop.     No current facility-administered medications on file prior to visit.     ROS Review of Systems  Constitutional: Negative for activity change, appetite change and fever.  HENT: Negative for congestion, rhinorrhea and sore throat.   Eyes: Negative for visual disturbance.  Respiratory: Negative for cough and shortness of breath.   Cardiovascular: Negative for chest pain and palpitations.  Gastrointestinal: Negative for abdominal pain, diarrhea and nausea.  Genitourinary: Negative for dysuria.  Musculoskeletal: Positive for arthralgias. Negative for myalgias.    Objective:  BP 129/79   Pulse 92   Temp 97.7 F (36.5 C) (Oral)   Ht 5' 5"  (1.651 m)   Wt 145 lb (65.8 kg)   BMI 24.13 kg/m   BP Readings from Last 3 Encounters:  12/08/16 129/79  10/14/16 107/67  03/08/16 116/62    Wt Readings from Last 3 Encounters:  12/08/16 145 lb (65.8 kg)  10/14/16 144 lb 9.6 oz (65.6 kg)  03/08/16 148 lb (67.1 kg)     Physical Exam  Constitutional: She is oriented to person, place, and time. She appears well-developed and well-nourished. No distress.  HENT:  Head: Normocephalic and atraumatic.  Right Ear: External ear normal.  Left Ear: External ear normal.  Nose: Nose normal.  Mouth/Throat: Oropharynx is clear and moist.  Eyes: Pupils are equal,  round, and reactive to light. Conjunctivae and EOM are normal.  Neck: Normal range of motion. Neck supple. No thyromegaly present.  Cardiovascular: Normal rate, regular rhythm and normal heart sounds.   No murmur heard. Pulmonary/Chest: Effort normal and breath sounds normal. No respiratory distress. She has no wheezes. She has no rales.  Abdominal: Soft. Bowel sounds are normal. She exhibits no distension. There is no tenderness.  Lymphadenopathy:    She has no cervical adenopathy.  Neurological: She is alert and oriented to person, place, and time. She has normal reflexes.  Skin: Skin is warm and dry.  Psychiatric: She has a normal mood and affect. Her behavior is normal. Judgment and thought content normal.        Assessment & Plan:   Draya was seen today for hypertension.  Diagnoses and all orders for this visit:  Essential hypertension -     CBC with Differential/Platelet -     CMP14+EGFR  Mixed hyperlipidemia -     Lipid panel  Low vitamin D level -     DG [facility] DEXA  Postmenopausal -     DG [facility] DEXA  Other orders -     ALPRAZolam (XANAX) 0.25 MG tablet; TAKE 1/2 TABLET 3 TIMES A DAY AS NEEDED FOR ANXIETY -     quinapril (ACCUPRIL) 40 MG tablet; TAKE ONE TABLET BY MOUTH ONCE DAILY IN THE MORNING -     verapamil (CALAN-SR) 240 MG CR tablet; TAKE ONE TABLET BY MOUTH ONCE DAILY IN THE MORNING -     tiZANidine (ZANAFLEX) 4 MG tablet; Take 1 tablet (4 mg total) by mouth at bedtime. For neck pain   I have discontinued Ms. Capes's esomeprazole. I am also having her maintain her Calcium Carbonate-Vitamin D (CALCARB 600/D PO), diphenhydrAMINE, latanoprost, dorzolamide, Krill Oil, Vitamin D, ALPRAZolam, quinapril, verapamil, and tiZANidine.  Meds ordered this encounter  Medications  . ALPRAZolam (XANAX) 0.25 MG tablet    Sig: TAKE 1/2 TABLET 3 TIMES A DAY AS NEEDED FOR ANXIETY    Dispense:  45 tablet    Refill:  5    Do not fill until 01/15/17  . quinapril (ACCUPRIL)  40 MG tablet    Sig: TAKE ONE TABLET BY MOUTH ONCE DAILY IN THE MORNING    Dispense:  30 tablet    Refill:  5  . verapamil (CALAN-SR) 240 MG CR tablet    Sig: TAKE ONE TABLET BY MOUTH ONCE DAILY IN THE MORNING    Dispense:  30 tablet    Refill:  5  . tiZANidine (ZANAFLEX) 4 MG tablet    Sig: Take 1 tablet (4 mg total) by mouth at bedtime. For neck pain    Dispense:  30 tablet    Refill:  5     Follow-up: Return in about 6 months (around 06/07/2017).  Claretta Fraise, M.D.

## 2016-12-08 NOTE — Patient Instructions (Signed)
Use Allegra 180 daily for allergy-purchase over the counter  Nexium 20 mg which you can purchase over the counter should only be taken every other day.

## 2016-12-22 ENCOUNTER — Ambulatory Visit: Payer: Medicare Other

## 2016-12-22 ENCOUNTER — Other Ambulatory Visit: Payer: Medicare Other

## 2017-01-03 ENCOUNTER — Ambulatory Visit (INDEPENDENT_AMBULATORY_CARE_PROVIDER_SITE_OTHER): Payer: Medicare Other

## 2017-01-03 ENCOUNTER — Other Ambulatory Visit: Payer: Self-pay | Admitting: Family Medicine

## 2017-01-03 DIAGNOSIS — Z780 Asymptomatic menopausal state: Secondary | ICD-10-CM | POA: Diagnosis not present

## 2017-01-03 MED ORDER — ALENDRONATE SODIUM 70 MG PO TABS: 70.0000 mg | ORAL_TABLET | ORAL | 11 refills | Status: DC

## 2017-01-04 ENCOUNTER — Other Ambulatory Visit: Payer: Self-pay | Admitting: Family Medicine

## 2017-01-04 MED ORDER — IBANDRONATE SODIUM 150 MG PO TABS: 150.0000 mg | ORAL_TABLET | ORAL | 3 refills | Status: DC

## 2017-01-05 ENCOUNTER — Telehealth: Payer: Self-pay | Admitting: *Deleted

## 2017-01-05 NOTE — Telephone Encounter (Signed)
Pt. Allergic to actonel and has GI intolerance of fosamax. Can you get [place] approved? Thanks, WS

## 2017-01-05 NOTE — Telephone Encounter (Signed)
Lacey Christensen is not covered by pt's insurance Please advise [facility]

## 2017-01-17 DIAGNOSIS — L820 Inflamed seborrheic keratosis: Secondary | ICD-10-CM | POA: Diagnosis not present

## 2017-01-17 DIAGNOSIS — D492 Neoplasm of unspecified behavior of bone, soft tissue, and skin: Secondary | ICD-10-CM | POA: Diagnosis not present

## 2017-01-17 DIAGNOSIS — L720 Epidermal cyst: Secondary | ICD-10-CM | POA: Diagnosis not present

## 2017-01-17 DIAGNOSIS — L821 Other seborrheic keratosis: Secondary | ICD-10-CM | POA: Diagnosis not present

## 2017-01-17 DIAGNOSIS — L814 Other melanin hyperpigmentation: Secondary | ICD-10-CM | POA: Diagnosis not present

## 2017-01-17 DIAGNOSIS — L579 Skin changes due to chronic exposure to nonionizing radiation, unspecified: Secondary | ICD-10-CM | POA: Diagnosis not present

## 2017-01-18 ENCOUNTER — Telehealth: Payer: Self-pay

## 2017-01-18 MED ORDER — IBANDRONATE SODIUM 150 MG PO TABS: 150.0000 mg | ORAL_TABLET | ORAL | 3 refills | Status: DC

## 2017-01-18 NOTE — Telephone Encounter (Signed)
Dr Livia Snellen said patient allergic to  Actonel and GI intolerance to Fosamax  Can you send in [place] again because pharmacy said it was cancelled and then they can send me the prior auth for it  Thanks

## 2017-01-18 NOTE — Telephone Encounter (Signed)
Rx sent in to pharmacy. 

## 2017-01-28 ENCOUNTER — Other Ambulatory Visit: Payer: Self-pay | Admitting: Family Medicine

## 2017-01-28 NOTE — Telephone Encounter (Signed)
Last seen 12/08/16  Dr Livia Snellen  If approved route to [facility]

## 2017-01-28 NOTE — Telephone Encounter (Signed)
Forward to PCP/Stacks

## 2017-02-01 ENCOUNTER — Other Ambulatory Visit: Payer: Self-pay | Admitting: Family Medicine

## 2017-02-01 NOTE — Telephone Encounter (Signed)
Rx called to pharmacy

## 2017-02-01 NOTE — Telephone Encounter (Signed)
[facility] 

## 2017-02-11 ENCOUNTER — Other Ambulatory Visit: Payer: Self-pay | Admitting: *Deleted

## 2017-02-11 MED ORDER — VERAPAMIL HCL ER 240 MG PO TBCR: EXTENDED_RELEASE_TABLET | ORAL | 0 refills | Status: DC

## 2017-03-02 ENCOUNTER — Other Ambulatory Visit: Payer: Self-pay | Admitting: Family Medicine

## 2017-03-02 NOTE — Telephone Encounter (Signed)
Last seen 12/08/16  Dr Livia Snellen  If approved route to [facility]

## 2017-03-21 ENCOUNTER — Other Ambulatory Visit: Payer: Self-pay

## 2017-03-21 ENCOUNTER — Inpatient Hospital Stay (HOSPITAL_COMMUNITY)
Admission: EM | Admit: 2017-03-21 | Discharge: 2017-03-22 | DRG: 395 | Disposition: A | Discharge status: Home / Self Care | Payer: Medicare Other | Attending: Family Medicine | Admitting: Family Medicine

## 2017-03-21 ENCOUNTER — Emergency Department (HOSPITAL_COMMUNITY): Payer: Medicare Other

## 2017-03-21 ENCOUNTER — Encounter (HOSPITAL_COMMUNITY): Payer: Self-pay | Admitting: Emergency Medicine

## 2017-03-21 DIAGNOSIS — K625 Hemorrhage of anus and rectum: Secondary | ICD-10-CM | POA: Diagnosis not present

## 2017-03-21 DIAGNOSIS — F17200 Nicotine dependence, unspecified, uncomplicated: Secondary | ICD-10-CM | POA: Diagnosis present

## 2017-03-21 DIAGNOSIS — I10 Essential (primary) hypertension: Secondary | ICD-10-CM | POA: Diagnosis present

## 2017-03-21 DIAGNOSIS — Z8249 Family history of ischemic heart disease and other diseases of the circulatory system: Secondary | ICD-10-CM | POA: Diagnosis not present

## 2017-03-21 DIAGNOSIS — F411 Generalized anxiety disorder: Secondary | ICD-10-CM | POA: Diagnosis not present

## 2017-03-21 DIAGNOSIS — Z8711 Personal history of peptic ulcer disease: Secondary | ICD-10-CM | POA: Diagnosis not present

## 2017-03-21 DIAGNOSIS — F17210 Nicotine dependence, cigarettes, uncomplicated: Secondary | ICD-10-CM | POA: Diagnosis present

## 2017-03-21 DIAGNOSIS — K219 Gastro-esophageal reflux disease without esophagitis: Secondary | ICD-10-CM | POA: Diagnosis not present

## 2017-03-21 DIAGNOSIS — Z833 Family history of diabetes mellitus: Secondary | ICD-10-CM

## 2017-03-21 DIAGNOSIS — K921 Melena: Secondary | ICD-10-CM

## 2017-03-21 DIAGNOSIS — N281 Cyst of kidney, acquired: Secondary | ICD-10-CM | POA: Diagnosis not present

## 2017-03-21 DIAGNOSIS — K649 Unspecified hemorrhoids: Secondary | ICD-10-CM | POA: Diagnosis present

## 2017-03-21 DIAGNOSIS — K648 Other hemorrhoids: Secondary | ICD-10-CM | POA: Diagnosis present

## 2017-03-21 DIAGNOSIS — D72829 Elevated white blood cell count, unspecified: Secondary | ICD-10-CM | POA: Diagnosis present

## 2017-03-21 DIAGNOSIS — K529 Noninfective gastroenteritis and colitis, unspecified: Secondary | ICD-10-CM | POA: Diagnosis not present

## 2017-03-21 DIAGNOSIS — H409 Unspecified glaucoma: Secondary | ICD-10-CM | POA: Diagnosis present

## 2017-03-21 DIAGNOSIS — E785 Hyperlipidemia, unspecified: Secondary | ICD-10-CM | POA: Diagnosis present

## 2017-03-21 DIAGNOSIS — K55032 Diffuse acute (reversible) ischemia of large intestine: Secondary | ICD-10-CM | POA: Diagnosis present

## 2017-03-21 DIAGNOSIS — H4020X0 Unspecified primary angle-closure glaucoma, stage unspecified: Secondary | ICD-10-CM | POA: Diagnosis present

## 2017-03-21 LAB — COMPREHENSIVE METABOLIC PANEL
ALK PHOS: 68 U/L (ref 38–126)
ALT: 19 U/L (ref 14–54)
ANION GAP: 11 (ref 5–15)
AST: 20 U/L (ref 15–41)
Albumin: 3.8 g/dL (ref 3.5–5.0)
BILIRUBIN TOTAL: 0.6 mg/dL (ref 0.3–1.2)
BUN: 7 mg/dL (ref 6–20)
CALCIUM: 9.4 mg/dL (ref 8.9–10.3)
CO2: 21 mmol/L — AB (ref 22–32)
CREATININE: 0.73 mg/dL (ref 0.44–1.00)
Chloride: 104 mmol/L (ref 101–111)
GFR calc non Af Amer: >60 mL/min (ref >60)
GLUCOSE: 119 mg/dL — AB (ref 65–99)
Potassium: 3.9 mmol/L (ref 3.5–5.1)
SODIUM: 136 mmol/L (ref 135–145)
TOTAL PROTEIN: 7.6 g/dL (ref 6.5–8.1)

## 2017-03-21 LAB — CBC WITH DIFFERENTIAL/PLATELET
BASOS PCT: 0 %
Basophils Absolute: 0.1 10*3/uL (ref 0.0–0.1)
EOS ABS: 0.1 10*3/uL (ref 0.0–0.7)
EOS PCT: 1 %
HCT: 39.7 % (ref 36.0–46.0)
Hemoglobin: 13.1 g/dL (ref 12.0–15.0)
LYMPHS ABS: 2.4 10*3/uL (ref 0.7–4.0)
Lymphocytes Relative: 14 %
MCH: 31.2 pg (ref 26.0–34.0)
MCHC: 33 g/dL (ref 30.0–36.0)
MCV: 94.5 fL (ref 78.0–100.0)
MONOS PCT: 10 %
Monocytes Absolute: 1.7 10*3/uL — ABNORMAL HIGH (ref 0.1–1.0)
Neutro Abs: 12.3 10*3/uL — ABNORMAL HIGH (ref 1.7–7.7)
Neutrophils Relative %: 75 %
PLATELETS: 423 10*3/uL — AB (ref 150–400)
RBC: 4.2 MIL/uL (ref 3.87–5.11)
RDW: 13.2 % (ref 11.5–15.5)
WBC: 16.5 10*3/uL — AB (ref 4.0–10.5)

## 2017-03-21 LAB — URINALYSIS, ROUTINE W REFLEX MICROSCOPIC
BILIRUBIN URINE: NEGATIVE
Glucose, UA: NEGATIVE mg/dL
HGB URINE DIPSTICK: NEGATIVE
KETONES UR: NEGATIVE mg/dL
Leukocytes, UA: NEGATIVE
NITRITE: NEGATIVE
PROTEIN: NEGATIVE mg/dL
Specific Gravity, Urine: 1.011 (ref 1.005–1.030)
pH: 6 (ref 5.0–8.0)

## 2017-03-21 LAB — LIPASE, BLOOD: Lipase: 50 U/L (ref 11–51)

## 2017-03-21 LAB — POC OCCULT BLOOD, ED: Fecal Occult Bld: POSITIVE — AB

## 2017-03-21 MED ORDER — MORPHINE SULFATE (PF) 2 MG/ML IV SOLN: 0.5000 mg | INTRAVENOUS | Status: DC | PRN

## 2017-03-21 MED ORDER — TIZANIDINE HCL 4 MG PO TABS: 4.0000 mg | ORAL_TABLET | Freq: Every day | ORAL | Status: DC

## 2017-03-21 MED ORDER — METRONIDAZOLE IN NACL 5-0.79 MG/ML-% IV SOLN
500.0000 mg | Freq: Three times a day (TID) | INTRAVENOUS | Status: DC
  Administered 2017-03-21 – 2017-03-22 (×2): 500 mg via INTRAVENOUS
  Filled 2017-03-21 (×2): qty 100

## 2017-03-21 MED ORDER — CIPROFLOXACIN IN D5W 400 MG/200ML IV SOLN
400.0000 mg | Freq: Two times a day (BID) | INTRAVENOUS | Status: DC
  Administered 2017-03-21 – 2017-03-22 (×2): 400 mg via INTRAVENOUS
  Filled 2017-03-21 (×2): qty 200

## 2017-03-21 MED ORDER — LISINOPRIL 10 MG PO TABS
40.0000 mg | ORAL_TABLET | Freq: Every morning | ORAL | Status: DC
  Filled 2017-03-21: qty 4

## 2017-03-21 MED ORDER — ONDANSETRON HCL 4 MG/2ML IJ SOLN: 4.0000 mg | Freq: Four times a day (QID) | INTRAMUSCULAR | Status: DC | PRN

## 2017-03-21 MED ORDER — NICOTINE 14 MG/24HR TD PT24
14.0000 mg | MEDICATED_PATCH | Freq: Every day | TRANSDERMAL | Status: DC
  Administered 2017-03-21 – 2017-03-22 (×2): 14 mg via TRANSDERMAL
  Filled 2017-03-21 (×2): qty 1

## 2017-03-21 MED ORDER — CIPROFLOXACIN IN D5W 400 MG/200ML IV SOLN
400.0000 mg | Freq: Once | INTRAVENOUS | Status: AC
  Administered 2017-03-21: 400 mg via INTRAVENOUS
  Filled 2017-03-21: qty 200

## 2017-03-21 MED ORDER — ACETAMINOPHEN 650 MG RE SUPP: 650.0000 mg | Freq: Four times a day (QID) | RECTAL | Status: DC | PRN

## 2017-03-21 MED ORDER — ONDANSETRON HCL 4 MG PO TABS: 4.0000 mg | ORAL_TABLET | Freq: Four times a day (QID) | ORAL | Status: DC | PRN

## 2017-03-21 MED ORDER — DIPHENHYDRAMINE HCL 25 MG PO CAPS: 25.0000 mg | ORAL_CAPSULE | Freq: Four times a day (QID) | ORAL | Status: DC | PRN

## 2017-03-21 MED ORDER — KETOROLAC TROMETHAMINE 15 MG/ML IJ SOLN
15.0000 mg | Freq: Four times a day (QID) | INTRAMUSCULAR | Status: DC | PRN
Start: 2017-03-21 — End: 2017-03-22

## 2017-03-21 MED ORDER — PANTOPRAZOLE SODIUM 40 MG IV SOLR
40.0000 mg | Freq: Once | INTRAVENOUS | Status: AC
  Administered 2017-03-21: 40 mg via INTRAVENOUS
  Filled 2017-03-21: qty 40

## 2017-03-21 MED ORDER — ACETAMINOPHEN 325 MG PO TABS
650.0000 mg | ORAL_TABLET | Freq: Four times a day (QID) | ORAL | Status: DC | PRN
  Administered 2017-03-21: 650 mg via ORAL
  Filled 2017-03-21: qty 2

## 2017-03-21 MED ORDER — LATANOPROST 0.005 % OP SOLN
1.0000 [drp] | Freq: Every day | OPHTHALMIC | Status: DC
  Administered 2017-03-21: 1 [drp] via OPHTHALMIC
  Filled 2017-03-21: qty 2.5

## 2017-03-21 MED ORDER — DORZOLAMIDE HCL 2 % OP SOLN
1.0000 [drp] | Freq: Two times a day (BID) | OPHTHALMIC | Status: DC
  Administered 2017-03-21 – 2017-03-22 (×3): 1 [drp] via OPHTHALMIC
  Filled 2017-03-21: qty 10

## 2017-03-21 MED ORDER — PANTOPRAZOLE SODIUM 40 MG PO TBEC
40.0000 mg | DELAYED_RELEASE_TABLET | Freq: Every day | ORAL | Status: DC
  Administered 2017-03-22: 40 mg via ORAL
  Filled 2017-03-21: qty 1

## 2017-03-21 MED ORDER — SODIUM CHLORIDE 0.9 % IV SOLN
INTRAVENOUS | Status: DC
  Administered 2017-03-21 – 2017-03-22 (×2): via INTRAVENOUS

## 2017-03-21 MED ORDER — TRAZODONE HCL 50 MG PO TABS: 25.0000 mg | ORAL_TABLET | Freq: Every evening | ORAL | Status: DC | PRN

## 2017-03-21 MED ORDER — IOPAMIDOL (ISOVUE-300) INJECTION 61%
100.0000 mL | Freq: Once | INTRAVENOUS | Status: AC | PRN
  Administered 2017-03-21: 100 mL via INTRAVENOUS

## 2017-03-21 MED ORDER — VERAPAMIL HCL ER 240 MG PO TBCR
240.0000 mg | EXTENDED_RELEASE_TABLET | Freq: Every day | ORAL | Status: DC
  Administered 2017-03-22: 240 mg via ORAL
  Filled 2017-03-21: qty 1

## 2017-03-21 MED ORDER — METRONIDAZOLE IN NACL 5-0.79 MG/ML-% IV SOLN
500.0000 mg | Freq: Once | INTRAVENOUS | Status: AC
  Administered 2017-03-21: 500 mg via INTRAVENOUS
  Filled 2017-03-21: qty 100

## 2017-03-21 MED ORDER — ALPRAZOLAM 0.25 MG PO TABS: 0.2500 mg | ORAL_TABLET | Freq: Three times a day (TID) | ORAL | Status: DC | PRN

## 2017-03-21 MED ORDER — SODIUM CHLORIDE 0.9 % IV BOLUS (SEPSIS)
1000.0000 mL | Freq: Once | INTRAVENOUS | Status: AC
  Administered 2017-03-21: 1000 mL via INTRAVENOUS

## 2017-03-21 NOTE — H&P (Signed)
History and Physical  Lacey Christensen JSH:[id number] DOB: 11-03-1945 DOA: 03/21/2017  Referring physician: Thurnell Garbe PCP: Claretta Fraise, MD  GI: [facility]  Chief Complaint: rectal bleeding  HPI: Lacey Christensen is a 71 y.o. female with a hx of HTN and PUD who presented to the emergency department with complaint of abdominal pain since yesterday and bright red blood per rectum with BMs that started today. Patient states that yesterday she started having watery diarrhea, had about 5-10 episodes yesterday with some associated chills. Late afternoon yesterday she started to have some lower abdominal discomfort described as achy. States pain is somewhat relieved following urination, no other specific alleviating/aggravating factors. Has had increased urinary frequency. This morning had two BMs with bright red blood on toilet paper and in toilet bowl. Checked temperature at it was 100.2 therefore she took some Tylenol at 8:00. Denies nausea, vomiting, dysuria, vaginal bleeding, or vaginal discharge.   GI History Colorectal cancer screening: no adenomas to date, colon exams in 2001, 2008,2016 [place]: "good prep", 15 min WD time, no adenomas, Hyperplastic polyps, diverticulosis   Abdominal pain: [place] [facility] evaluation: 09/23/14 ER evaluation for abdominal pain, associated weight loss and bleeding 10/16/14: ER f/u office visit: "ok before June", increased BC powder use, better on BID Nexium, endo and colon recommended.   Duodenal narrowing: July 2016: A CT scan done in June demonstrated abnormality of the duodenum, i.e., a suspected inflammatory stricture with an adjacent diverticulum. endoscopic evaluation by Dr Barney Drain EGD: 3 mm pyloric ulcer, pylorus narrowed, stricture at D1/D2, to tight to pass the adult endoscope, and "snug" with the pedi scope, bx neg for H pylori, no stomach contents mentioned. 01/24/15: Office visit f/u: Weight down 6 lbs since 09/2014, poor appetite, "not hungry", occ ASA, WBC  13,700, Hb 13.5, LFTs normal, TSH normal, lipase normal  ED Course: Pt had a CT scan that revealed colitis from distal transverse colon to sigmoid colon, plan for admission for abx. Patient started on Cipro/Flagyl.   Review of Systems: All systems reviewed and apart from history of presenting illness, are negative.  Past Medical History:  Diagnosis Date  . Hypertension   . Narrow angle glaucoma suspect of both eyes   . PUD (peptic ulcer disease)    12 years ago, EGD at [facility] in [place]   Past Surgical History:  Procedure Laterality Date  . COLONOSCOPY N/A 10/24/2014   SLF: 1. Angulated rectosigmoid junction 2. Two rectal polyps removed 3. Small internal hemorrhoids   . dilitation and curritage    . ESOPHAGOGASTRODUODENOSCOPY N/A 10/24/2014   SLF: 1. Schatzki's ring at the gastroesophageal junction 2. Deformed pylous and superficial pyloric ulcer 3. Non-erosive gastritis ( inflammation) was found in the gastric antrum; multiple biopsies were performed. 4. Stricture at the junction of D!/D@   Social History:  reports that she has been smoking cigarettes.  She has a 25.00 pack-year smoking history. she has never used smokeless tobacco. She reports that she does not drink alcohol. Her drug history is not on file.  Allergies  Allergen Reactions  . Corn-Containing Products Itching  . Sulfa Antibiotics Other (See Comments)    Childhood reaction-unknown  . Timolol Other (See Comments)    depression  . Tape Rash    Family History  Problem Relation Age of Onset  . Diabetes Mother   . Hypertension Mother   . Hypertension Father   . Colon cancer Neg Hx     Prior to Admission medications   Medication  Sig Start Date End Date Taking? Authorizing Provider  ALPRAZolam (XANAX) 0.25 MG tablet TAKE 1/2 TABLET 3 TIMES A DAY AS NEEDED FOR ANXIETY 01/31/17  Yes Stacks, Cletus Gash, MD  Calcium Carbonate-Vitamin D (CALCARB 600/D PO) Take 1 tablet by mouth every evening.   Yes  [provider]  Cholecalciferol (VITAMIN D) 2000 units tablet Take 1 tablet (2,000 Units total) by mouth daily. 03/08/16  Yes Claretta Fraise, MD  diphenhydrAMINE (BENADRYL) 25 mg capsule Take 25 mg by mouth every 6 (six) hours as needed for itching or allergies.   Yes [provider]  dorzolamide (TRUSOPT) 2 % ophthalmic solution PLACE 1 DROP INTO THE RIGHT EYE 2 TIMES DAILY. 12/22/15  Yes [provider]  Javier Docker Oil 300 MG CAPS Take 2 capsules (600 mg total) by mouth 2 (two) times daily. 03/08/16  Yes Stacks, Cletus Gash, MD  latanoprost (XALATAN) 0.005 % ophthalmic solution 1 drop. 02/11/15  Yes [provider]  quinapril (ACCUPRIL) 40 MG tablet TAKE ONE TABLET BY MOUTH ONCE DAILY IN THE MORNING 12/08/16  Yes Stacks, Cletus Gash, MD  tiZANidine (ZANAFLEX) 4 MG tablet Take 1 tablet (4 mg total) by mouth at bedtime. For neck pain 12/08/16  Yes Stacks, Cletus Gash, MD  verapamil (CALAN-SR) 240 MG CR tablet TAKE ONE TABLET BY MOUTH ONCE DAILY IN THE MORNING 02/11/17  Yes Stacks, Cletus Gash, MD  Aspirin-Acetaminophen-Caffeine (GOODY HEADACHE PO) Take 1 packet by mouth daily as needed for headache.    [provider]   Physical Exam: Vitals:   03/21/17 0934 03/21/17 1147 03/21/17 1148  BP: (!) 147/91 (!) 118/59   Pulse: (!) 101  73  Resp: 18    Temp: 98.3 F (36.8 C)    TempSrc: Oral    SpO2: 96%  95%  Weight: 65.8 kg (145 lb)    Height: 5' 4.5" (1.638 m)     Constitutional: She appears well-developed and well-nourished. No distress.  HENT: Head: Normocephalic and atraumatic.  Eyes: Conjunctivae are normal. Right eye exhibits no discharge. Left eye exhibits no discharge.  Cardiovascular: Normal rate and regular rhythm. No murmur heard. Pulmonary/Chest: Breath sounds normal. No respiratory distress. She has no wheezes. She has no rales.  Abdominal: Soft. She exhibits no distension. There is tenderness (Mild LLQ and left suprapubic). There is no rigidity, no rebound, no  guarding and no CVA tenderness.  Genitourinary: Rectal exam shows external hemorrhoid (one at 6:00 position, one at 12:00 position) and guaiac positive stool. Rectal exam shows no tenderness.  Neurological: She is alert.  Clear speech.   Skin: Skin is warm and dry. No rash noted.  Psychiatric: She has a normal mood and affect. Her behavior is normal.   Labs on Admission:  Basic Metabolic Panel: Recent Labs  Lab 03/21/17 1007  NA 136  K 3.9  CL 104  CO2 21*  GLUCOSE 119*  BUN 7  CREATININE 0.73  CALCIUM 9.4   Liver Function Tests: Recent Labs  Lab 03/21/17 1007  AST 20  ALT 19  ALKPHOS 68  BILITOT 0.6  PROT 7.6  ALBUMIN 3.8   Recent Labs  Lab 03/21/17 1007  LIPASE 50   No results for input(s): AMMONIA in the last 168 hours. CBC: Recent Labs  Lab 03/21/17 1007  WBC 16.5*  NEUTROABS 12.3*  HGB 13.1  HCT 39.7  MCV 94.5  PLT 423*   Cardiac Enzymes: No results for input(s): CKTOTAL, CKMB, CKMBINDEX, TROPONINI in the last 168 hours.  BNP (last 3 results) No results for  input(s): PROBNP in the last 8760 hours. CBG: No results for input(s): GLUCAP in the last 168 hours.  Radiological Exams on Admission: Ct Abdomen Pelvis W Contrast  Result Date: 03/21/2017 CLINICAL DATA:  Urinary frequency, diarrhea since last night with bright red blood today, suspected diverticulitis, essential hypertension, history peptic ulcer disease and hemorrhoids EXAM: CT ABDOMEN AND PELVIS WITH CONTRAST TECHNIQUE: Multidetector CT imaging of the abdomen and pelvis was performed using the standard protocol following bolus administration of intravenous contrast. Sagittal and coronal MPR images reconstructed from axial data set. CONTRAST:  127mL ISOVUE-300 IOPAMIDOL (ISOVUE-300) INJECTION 61% IV. No oral contrast. COMPARISON:  09/23/2014 FINDINGS: Lower chest: Lung bases clear Hepatobiliary: Dependent increased attenuation within the gallbladder likely representing cholelithiasis. Gallbladder  is distended without definite wall thickening or biliary dilatation. Liver unremarkable. Pancreas: Normal appearance Spleen: Normal appearance Adrenals/Urinary Tract: Thickening of LEFT adrenal gland with nodule in the medial limb 18 x 9 mm demonstrating significant washout on delayed images consistent with an adrenal adenoma. Thickening of LEFT adrenal gland without mass. Exophytic cyst posterior mid LEFT kidney 2.6 x 1.7 cm additional tiny cysts LEFT kidney. Intermediate attenuation nodule at posterior aspect of mid LEFT kidney 9 x 6 mm, slightly decreased in size since 2016 though slightly higher now in attenuation. No hydronephrosis, hydroureter, or urinary tract calcification. Bladder unremarkable. Stomach/Bowel: Diffuse wall thickening of the colon from distal transverse colon through sigmoid colon compatible with colitis. No colonic diverticular identified. Minimal pericolic stranding at descending colon and lateral Conal fascia. Proximal: Normal appearance. Normal appendix. Stomach and small bowel loops unremarkable. Duodenal and periduodenal changes seen on the previous exam resolved. Vascular/Lymphatic: Atherosclerotic calcification aorta and coronary arteries. Aorta normal caliber without aneurysm. No adenopathy. Reproductive: Unremarkable uterus and adnexa Other: No free air or free fluid.  No hernia. Musculoskeletal: Demineralized IMPRESSION: Colitis from distal transverse colon through sigmoid colon ; differential diagnosis would include inflammatory bowel disease and infection, ischemia considered less likely. BILATERAL renal cysts including an intermediate attenuation cyst at the posterior RIGHT kidney which is minimally decreased in size since previous exam. Probable LEFT adrenal adenoma. Aortic Atherosclerosis (ICD10-I70.0). Electronically Signed   By: Lavonia Dana M.D.   On: 03/21/2017 12:27    EKG: Independently reviewed.   Assessment/Plan Principal Problem:   Acute colitis Active  Problems:   Rectal bleeding   Hyperlipidemia   Essential hypertension   Generalized anxiety disorder   Glaucoma   GERD (gastroesophageal reflux disease)   Current every day smoker   1. Acute colitis -  I suspect that this is infectious given elevated WBC count.  Will admit for IV antibiotics. Will continue IV cipro/flagyl started in ED.  Will obtain blood cultures.  Will put patient on clear liquid diet.  Advance diet as tolerated.  The patient is reporting improvement in symptoms.  Hopefully she can be discharged on oral antibiotics in the next 1-2 days. 2. Leukocytosis - secondary to colitis infection - will follow CBC.   3. Rectal bleeding - Pt does have rectal hemorrhoids and is hemoccult positive.  Hold blood thinners for now.  Will monitor hemoglobin. Will treat colitis as above and follow clinically.  Pt has had colon cancer screening as noted above with no adenomas reported, hyperplastic polyps and diverticulosis noted. Will consult GI if bleeding continues.  4. Essential hypertension - resume home medications and follow.  5. Glaucoma - resume home medications.  6. GERD - protonix ordered for GI protection.  7. Smoker - will order nicotine patch  and counsel to stop tobacco use.   DVT Prophylaxis: SCDs Code Status: Full   Family Communication:   Disposition Plan: TBD   Time spent: 8 mins  Irwin Brakeman, MD [facility] Pager [phone]  If 7PM-7AM, please contact night-coverage [email or web address] Password [email or web address] 03/21/2017, 1:36 PM

## 2017-03-21 NOTE — ED Provider Notes (Signed)
[facility] EMERGENCY DEPARTMENT Provider Note   CSN: [id number] Arrival date & time: 03/21/17  0258     History   Chief Complaint Chief Complaint  Patient presents with  . Rectal Bleeding    HPI Lacey Christensen is a 71 y.o. female with a hx of HTN and PUD who presents to the emergency department with complaint of abdominal pain since yesterday and bright red blood per rectum with BMs that started today. Patient states that yesterday she started having watery diarrhea, had about 5-10 episodes yesterday with some associated chills. Late afternoon yesterday she started to have some lower abdominal discomfort described as achy. States pain is somewhat relieved following urination, no other specific alleviating/aggravating factors. Has had increased urinary frequency. This morning had two BMs with bright red blood on toilet paper and in toilet bowl. Checked temperature at it was 100.2 therefore she took some Tylenol at 8:00. Denies nausea, vomiting, dysuria, vaginal bleeding, or vaginal discharge.   HPI  Past Medical History:  Diagnosis Date  . Hypertension   . Narrow angle glaucoma suspect of both eyes   . PUD (peptic ulcer disease)    12 years ago, EGD at [place] [facility]    Patient Active Problem List   Diagnosis Date Noted  . Inconclusive mammogram due to dense breasts 04/15/2015  . Degenerative disc disease, cervical 04/15/2015  . Hyperlipidemia 03/25/2015  . Neck pain 03/25/2015  . Essential hypertension 03/25/2015  . Generalized anxiety disorder 03/25/2015  . Glaucoma 03/25/2015  . Duodenal stricture 01/24/2015  . PUD (peptic ulcer disease)     Past Surgical History:  Procedure Laterality Date  . COLONOSCOPY N/A 10/24/2014   SLF: 1. Angulated rectosigmoid junction 2. Two rectal polyps removed 3. Small internal hemorrhoids   . dilitation and curritage    . ESOPHAGOGASTRODUODENOSCOPY N/A 10/24/2014   SLF: 1. Schatzki's ring at the  gastroesophageal junction 2. Deformed pylous and superficial pyloric ulcer 3. Non-erosive gastritis ( inflammation) was found in the gastric antrum; multiple biopsies were performed. 4. Stricture at the junction of D!/D@    OB History    No data available       Home Medications    Prior to Admission medications   Medication Sig Start Date End Date Taking? Authorizing Provider  ALPRAZolam Duanne Moron) 0.25 MG tablet TAKE 1/2 TABLET 3 TIMES A DAY AS NEEDED FOR ANXIETY 01/31/17   Claretta Fraise, MD  Calcium Carbonate-Vitamin D (CALCARB 600/D PO) Take 1 tablet by mouth every evening.    [provider]  Cholecalciferol (VITAMIN D) 2000 units tablet Take 1 tablet (2,000 Units total) by mouth daily. 03/08/16   Claretta Fraise, MD  diphenhydrAMINE (BENADRYL) 25 mg capsule Take 25 mg by mouth every 6 (six) hours as needed for itching or allergies.    [provider]  dorzolamide (TRUSOPT) 2 % ophthalmic solution PLACE 1 DROP INTO THE RIGHT EYE 2 TIMES DAILY. 12/22/15   [provider]  ibandronate (BONIVA) 150 MG tablet Take 1 tablet (150 mg total) by mouth every 30 (thirty) days. Take with a full glass of water, on an empty stomach 01/18/17   Claretta Fraise, MD  Javier Docker Oil 300 MG CAPS Take 2 capsules (600 mg total) by mouth 2 (two) times daily. 03/08/16   Claretta Fraise, MD  latanoprost (XALATAN) 0.005 % ophthalmic solution 1 drop. 02/11/15   [provider]  quinapril (ACCUPRIL) 40 MG tablet TAKE ONE TABLET BY MOUTH ONCE DAILY IN THE MORNING 12/08/16  Claretta Fraise, MD  tiZANidine (ZANAFLEX) 4 MG tablet Take 1 tablet (4 mg total) by mouth at bedtime. For neck pain 12/08/16   Claretta Fraise, MD  verapamil (CALAN-SR) 240 MG CR tablet TAKE ONE TABLET BY MOUTH ONCE DAILY IN THE MORNING 02/11/17   Claretta Fraise, MD    Family History Family History  Problem Relation Age of Onset  . Diabetes Mother   . Hypertension Mother   . Hypertension Father   . Colon cancer Neg Hx      Social History Social History   Tobacco Use  . Smoking status: Current Every Day Smoker    Packs/day: 0.50    Years: 50.00    Pack years: 25.00    Types: Cigarettes  . Smokeless tobacco: Never Used  Substance Use Topics  . Alcohol use: No    Alcohol/week: 0.0 oz  . Drug use: Not on file     Allergies   Corn-containing products; Sulfa antibiotics; Timolol; and Tape   Review of Systems Review of Systems  Constitutional: Positive for chills and fever.  Respiratory: Negative for shortness of breath.   Cardiovascular: Negative for chest pain.  Gastrointestinal: Positive for abdominal pain, blood in stool and diarrhea. Negative for constipation, nausea and vomiting.  Genitourinary: Negative for dysuria, vaginal bleeding and vaginal discharge.  Skin: Negative for rash.  All other systems reviewed and are negative.    Physical Exam Updated Vital Signs BP (!) 147/91 (BP Location: Right Arm)   Pulse (!) 101   Temp 98.3 F (36.8 C) (Oral)   Resp 18   Ht 5' 4.5" (1.638 m)   Wt 65.8 kg (145 lb)   SpO2 96%   BMI 24.50 kg/m   Physical Exam  Constitutional: She appears well-developed and well-nourished. No distress.  HENT:  Head: Normocephalic and atraumatic.  Eyes: Conjunctivae are normal. Right eye exhibits no discharge. Left eye exhibits no discharge.  Cardiovascular: Normal rate and regular rhythm.  No murmur heard. Pulmonary/Chest: Breath sounds normal. No respiratory distress. She has no wheezes. She has no rales.  Abdominal: Soft. She exhibits no distension. There is tenderness (Mild LLQ and left suprapubic). There is no rigidity, no rebound, no guarding and no CVA tenderness.  Genitourinary: Rectal exam shows external hemorrhoid (one at 6:00 position, one at 12:00 position) and guaiac positive stool. Rectal exam shows no tenderness.  Neurological: She is alert.  Clear speech.   Skin: Skin is warm and dry. No rash noted.  Psychiatric: She has a normal mood and  affect. Her behavior is normal.  Nursing note and vitals reviewed.   ED Treatments / Results  Labs Results for orders placed or performed during the hospital encounter of 03/21/17  Comprehensive metabolic panel  Result Value Ref Range   Sodium 136 135 - 145 mmol/L   Potassium 3.9 3.5 - 5.1 mmol/L   Chloride 104 101 - 111 mmol/L   CO2 21 (L) 22 - 32 mmol/L   Glucose, Bld 119 (H) 65 - 99 mg/dL   BUN 7 6 - 20 mg/dL   Creatinine, Ser 0.73 0.44 - 1.00 mg/dL   Calcium 9.4 8.9 - 10.3 mg/dL   Total Protein 7.6 6.5 - 8.1 g/dL   Albumin 3.8 3.5 - 5.0 g/dL   AST 20 15 - 41 U/L   ALT 19 14 - 54 U/L   Alkaline Phosphatase 68 38 - 126 U/L   Total Bilirubin 0.6 0.3 - 1.2 mg/dL   GFR calc non Af Amer >60 >  60 mL/min   GFR calc Af Amer >60 >60 mL/min   Anion gap 11 5 - 15  Lipase, blood  Result Value Ref Range   Lipase 50 11 - 51 U/L  CBC with Diff  Result Value Ref Range   WBC 16.5 (H) 4.0 - 10.5 K/uL   RBC 4.20 3.87 - 5.11 MIL/uL   Hemoglobin 13.1 12.0 - 15.0 g/dL   HCT 39.7 36.0 - 46.0 %   MCV 94.5 78.0 - 100.0 fL   MCH 31.2 26.0 - 34.0 pg   MCHC 33.0 30.0 - 36.0 g/dL   RDW 13.2 11.5 - 15.5 %   Platelets 423 (H) 150 - 400 K/uL   Neutrophils Relative % 75 %   Neutro Abs 12.3 (H) 1.7 - 7.7 K/uL   Lymphocytes Relative 14 %   Lymphs Abs 2.4 0.7 - 4.0 K/uL   Monocytes Relative 10 %   Monocytes Absolute 1.7 (H) 0.1 - 1.0 K/uL   Eosinophils Relative 1 %   Eosinophils Absolute 0.1 0.0 - 0.7 K/uL   Basophils Relative 0 %   Basophils Absolute 0.1 0.0 - 0.1 K/uL  Urinalysis, Routine w reflex microscopic  Result Value Ref Range   Color, Urine STRAW (A) YELLOW   APPearance CLEAR CLEAR   Specific Gravity, Urine 1.011 1.005 - 1.030   pH 6.0 5.0 - 8.0   Glucose, UA NEGATIVE NEGATIVE mg/dL   Hgb urine dipstick NEGATIVE NEGATIVE   Bilirubin Urine NEGATIVE NEGATIVE   Ketones, ur NEGATIVE NEGATIVE mg/dL   Protein, ur NEGATIVE NEGATIVE mg/dL   Nitrite NEGATIVE NEGATIVE   Leukocytes, UA  NEGATIVE NEGATIVE  POC occult blood, ED  Result Value Ref Range   Fecal Occult Bld POSITIVE (A) NEGATIVE   EKG  EKG Interpretation None       Radiology Ct Abdomen Pelvis W Contrast  Result Date: 03/21/2017 CLINICAL DATA:  Urinary frequency, diarrhea since last night with bright red blood today, suspected diverticulitis, essential hypertension, history peptic ulcer disease and hemorrhoids EXAM: CT ABDOMEN AND PELVIS WITH CONTRAST TECHNIQUE: Multidetector CT imaging of the abdomen and pelvis was performed using the standard protocol following bolus administration of intravenous contrast. Sagittal and coronal MPR images reconstructed from axial data set. CONTRAST:  182mL ISOVUE-300 IOPAMIDOL (ISOVUE-300) INJECTION 61% IV. No oral contrast. COMPARISON:  09/23/2014 FINDINGS: Lower chest: Lung bases clear Hepatobiliary: Dependent increased attenuation within the gallbladder likely representing cholelithiasis. Gallbladder is distended without definite wall thickening or biliary dilatation. Liver unremarkable. Pancreas: Normal appearance Spleen: Normal appearance Adrenals/Urinary Tract: Thickening of LEFT adrenal gland with nodule in the medial limb 18 x 9 mm demonstrating significant washout on delayed images consistent with an adrenal adenoma. Thickening of LEFT adrenal gland without mass. Exophytic cyst posterior mid LEFT kidney 2.6 x 1.7 cm additional tiny cysts LEFT kidney. Intermediate attenuation nodule at posterior aspect of mid LEFT kidney 9 x 6 mm, slightly decreased in size since 2016 though slightly higher now in attenuation. No hydronephrosis, hydroureter, or urinary tract calcification. Bladder unremarkable. Stomach/Bowel: Diffuse wall thickening of the colon from distal transverse colon through sigmoid colon compatible with colitis. No colonic diverticular identified. Minimal pericolic stranding at descending colon and lateral Conal fascia. Proximal: Normal appearance. Normal appendix. Stomach  and small bowel loops unremarkable. Duodenal and periduodenal changes seen on the previous exam resolved. Vascular/Lymphatic: Atherosclerotic calcification aorta and coronary arteries. Aorta normal caliber without aneurysm. No adenopathy. Reproductive: Unremarkable uterus and adnexa Other: No free air or free fluid.  No hernia.  Musculoskeletal: Demineralized IMPRESSION: Colitis from distal transverse colon through sigmoid colon ; differential diagnosis would include inflammatory bowel disease and infection, ischemia considered less likely. BILATERAL renal cysts including an intermediate attenuation cyst at the posterior RIGHT kidney which is minimally decreased in size since previous exam. Probable LEFT adrenal adenoma. Aortic Atherosclerosis (ICD10-I70.0). Electronically Signed   By: Lavonia Dana M.D.   On: 03/21/2017 12:27    Procedures Procedures (including critical care time)  Medications Ordered in ED Medications  pantoprazole (PROTONIX) injection 40 mg (not administered)  ciprofloxacin (CIPRO) IVPB 400 mg (not administered)  metroNIDAZOLE (FLAGYL) IVPB 500 mg (not administered)  sodium chloride 0.9 % bolus 1,000 mL (1,000 mLs Intravenous New Bag/Given 03/21/17 1150)  iopamidol (ISOVUE-300) 61 % injection 100 mL (100 mLs Intravenous Contrast Given 03/21/17 1158)    Initial Impression / Assessment and Plan / ED Course  I have reviewed the triage vital signs and the nursing notes.  Pertinent labs & imaging results that were available during my care of the patient were reviewed by me and considered in my medical decision making (see chart for details).  Patient presents with abdominal discomfort, bright red blood per rectum, and urinary frequency. She has had associated fever/chills. Patient is nontoxic appearing, in no apparent distress, with stable vital signs at this time. Fecal occult blood positive, 2 external hemorrhoids on exam. LLQ tenderness, no rebound/guarding/rigidity. DDX:  diverticulitis, colitis, cystitis, pyelonephritis, pancreatitis, cholecystitis, bowel obstruction, appendicitis. Will evaluate with screening labs and CT abdomen/pelvis. Offered patient pain medication, she declined at this time. Will administer fluids.  Leukocytosis  At 16.1 and CT scan with colitis from distal transverse colon to sigmoid colon, plan for admission for abx. Patient started on Cipro/Flagyl. Findings and plan of care discussed with supervising physician Dr.McManus.  13:18: Discussed case with hospitalist, Dr. Wynetta Emery who accepts admission.   Discussed results and treatment plan with patient and her family. Provided opportunity for questions, patient confirmed understanding and is in agreement with plan.   Final Clinical Impressions(s) / ED Diagnoses   Final diagnoses:  Melena  Colitis   ED Discharge Orders    None       Amaryllis Dyke, PA-C 03/21/17 [place], [place], DO 03/23/17 1918

## 2017-03-21 NOTE — ED Triage Notes (Signed)
Pt reports urinary frequncy, diarrhea since last night. Pt reports had BM this am and reported bright red blood in toilet. Pt reports intermittent abd pain this am and a history of hemorrhoids.

## 2017-03-21 NOTE — ED Notes (Addendum)
Report given to [place], Therapist, sports for 3rd floor admission. Blood cultures being drawn by lab at this time and then medications to be started prior to going to floor.

## 2017-03-21 NOTE — Consult Note (Signed)
Referring Provider: Murlean Iba, MD Primary Care Physician:  Claretta Fraise, MD Primary Gastroenterologist:  Dr. Kathryne Hitch, [facility]. Previously Dr. Oneida Alar, but patient went back to see Dr. Percell Miller with [facility].   Reason for Consultation:  colitis  HPI: Lacey Christensen is a 71 y.o. female presented tp ED with complaints of abdominal pain starting yesterday, following by watery stools, and ultimately bloody stools. Low grade fever. No n/v. She has h/o duodenal stricture/pud in past. Now being followed by Dr. Percell Miller, GI at [facility].   Patient states her husband had diarrhea also, but "that's not unusual for him". They both had take out the evening before symptoms started. No recent antibiotic exposure.   Goody's powders for headache or muscle pain.   CT with diffuse wall thickening of the colon from distal transverse colon through sigmoid colon. WBC 16,500.   Prior to Admission medications   Medication Sig Start Date End Date Taking? Authorizing Provider  ALPRAZolam (XANAX) 0.25 MG tablet TAKE 1/2 TABLET 3 TIMES A DAY AS NEEDED FOR ANXIETY 01/31/17  Yes Stacks, Cletus Gash, MD  Calcium Carbonate-Vitamin D (CALCARB 600/D PO) Take 1 tablet by mouth every evening.   Yes [provider]  Cholecalciferol (VITAMIN D) 2000 units tablet Take 1 tablet (2,000 Units total) by mouth daily. 03/08/16  Yes Claretta Fraise, MD  diphenhydrAMINE (BENADRYL) 25 mg capsule Take 25 mg by mouth every 6 (six) hours as needed for itching or allergies.   Yes [provider]  dorzolamide (TRUSOPT) 2 % ophthalmic solution PLACE 1 DROP INTO THE RIGHT EYE 2 TIMES DAILY. 12/22/15  Yes [provider]  Javier Docker Oil 300 MG CAPS Take 2 capsules (600 mg total) by mouth 2 (two) times daily. 03/08/16  Yes Stacks, Cletus Gash, MD  latanoprost (XALATAN) 0.005 % ophthalmic solution 1 drop. 02/11/15  Yes [provider]  quinapril (ACCUPRIL) 40 MG tablet TAKE ONE TABLET BY MOUTH ONCE DAILY IN  THE MORNING 12/08/16  Yes Stacks, Cletus Gash, MD  tiZANidine (ZANAFLEX) 4 MG tablet Take 1 tablet (4 mg total) by mouth at bedtime. For neck pain 12/08/16  Yes Stacks, Cletus Gash, MD  verapamil (CALAN-SR) 240 MG CR tablet TAKE ONE TABLET BY MOUTH ONCE DAILY IN THE MORNING 02/11/17  Yes Stacks, Cletus Gash, MD  Aspirin-Acetaminophen-Caffeine (GOODY HEADACHE PO) Take 1 packet by mouth daily as needed for headache.    [provider]    Current Facility-Administered Medications  Medication Dose Route Frequency Provider Last Rate Last Dose  . ciprofloxacin (CIPRO) IVPB 400 mg  400 mg Intravenous Once Francine Graven, DO 200 mL/hr at 03/21/17 1355 400 mg at 03/21/17 1355  . metroNIDAZOLE (FLAGYL) IVPB 500 mg  500 mg Intravenous Once Francine Graven, DO 100 mL/hr at 03/21/17 1355 500 mg at 03/21/17 1355    Allergies as of 03/21/2017 - Review Complete 03/21/2017  Allergen Reaction Noted  . Corn-containing products Itching 08/01/2014  . Sulfa antibiotics Other (See Comments) 06/21/2014  . Timolol Other (See Comments) 03/25/2015  . Tape Rash 06/21/2014    Past Medical History:  Diagnosis Date  . Hypertension   . Narrow angle glaucoma suspect of both eyes   . PUD (peptic ulcer disease)    12 years ago, EGD at [facility] in [place]    Past Surgical History:  Procedure Laterality Date  . COLONOSCOPY N/A 10/24/2014   SLF: 1. Angulated rectosigmoid junction 2. Two rectal polyps removed 3. Small internal hemorrhoids   . dilitation and curritage    .  ESOPHAGOGASTRODUODENOSCOPY N/A 10/24/2014   SLF: 1. Schatzki's ring at the gastroesophageal junction 2. Deformed pylous and superficial pyloric ulcer 3. Non-erosive gastritis ( inflammation) was found in the gastric antrum; multiple biopsies were performed. 4. Stricture at the junction of D!/D@    Family History  Problem Relation Age of Onset  . Diabetes Mother   . Hypertension Mother   . Hypertension Father   . Colon cancer  Neg Hx     Social History   Socioeconomic History  . Marital status: Married    Spouse name: Not on file  . Number of children: Not on file  . Years of education: Not on file  . Highest education level: Not on file  Social Needs  . Financial resource strain: Not on file  . Food insecurity - worry: Not on file  . Food insecurity - inability: Not on file  . Transportation needs - medical: Not on file  . Transportation needs - non-medical: Not on file  Occupational History  . Not on file  Tobacco Use  . Smoking status: Current Every Day Smoker    Packs/day: 0.50    Years: 50.00    Pack years: 25.00    Types: Cigarettes  . Smokeless tobacco: Never Used  Substance and Sexual Activity  . Alcohol use: No    Alcohol/week: 0.0 oz  . Drug use: Not on file  . Sexual activity: Yes    Birth control/protection: Post-menopausal  Other Topics Concern  . Not on file  Social History Narrative  . Not on file     ROS:  General: Negative for anorexia, weight loss, fever, chills, fatigue, weakness. Eyes: Negative for vision changes.  ENT: Negative for hoarseness, difficulty swallowing , nasal congestion. CV: Negative for chest pain, angina, palpitations, dyspnea on exertion, peripheral edema.  Respiratory: Negative for dyspnea at rest, dyspnea on exertion, cough, sputum, wheezing.  GI: See history of present illness. GU:  Negative for dysuria, hematuria, urinary incontinence, urinary frequency, nocturnal urination.  MS: Negative for joint pain, low back pain.  Derm: Negative for rash or itching.  Neuro: Negative for weakness, abnormal sensation, seizure, frequent headaches, memory loss, confusion.  Psych: Negative for anxiety, depression, suicidal ideation, hallucinations.  Endo: Negative for unusual weight change.  Heme: Negative for bruising or bleeding. Allergy: Negative for rash or hives.       Physical Examination: Vital signs in last 24 hours: Temp:  [98.3 F (36.8 C)] 98.3  F (36.8 C) (12/24 0934) Pulse Rate:  [73-101] 73 (12/24 1148) Resp:  [18] 18 (12/24 0934) BP: (118-147)/(59-91) 118/59 (12/24 1147) SpO2:  [95 %-96 %] 95 % (12/24 1148) Weight:  [145 lb (65.8 kg)] 145 lb (65.8 kg) (12/24 0934)    General: Well-nourished, well-developed in no acute distress.  Head: Normocephalic, atraumatic.   Eyes: Conjunctiva pink, no icterus. Mouth: Oropharyngeal mucosa moist and pink , no lesions erythema or exudate. Neck: Supple without thyromegaly, masses, or lymphadenopathy.  Lungs: Clear to auscultation bilaterally.  Heart: Regular rate and rhythm, no murmurs rubs or gallops.  Abdomen: Bowel sounds are normal, mild left sided tenderness, nondistended, no hepatosplenomegaly or masses, no abdominal bruits or    hernia , no rebound or guarding.   Rectal: not performed Extremities: No lower extremity edema, clubbing, deformity.  Neuro: Alert and oriented x 4 , grossly normal neurologically.  Skin: Warm and dry, no rash or jaundice.   Psych: Alert and cooperative, normal mood and affect.  Intake/Output from previous day: No intake/output data recorded. Intake/Output this shift: Total I/O In: 1000 [IV Piggyback:1000] Out: -   Lab Results: CBC Recent Labs    03/21/17 1007  WBC 16.5*  HGB 13.1  HCT 39.7  MCV 94.5  PLT 423*   BMET Recent Labs    03/21/17 1007  NA 136  K 3.9  CL 104  CO2 21*  GLUCOSE 119*  BUN 7  CREATININE 0.73  CALCIUM 9.4   LFT Recent Labs    03/21/17 1007  BILITOT 0.6  ALKPHOS 68  AST 20  ALT 19  PROT 7.6  ALBUMIN 3.8    Lipase Recent Labs    03/21/17 1007  LIPASE 50    PT/INR No results for input(s): LABPROT, INR in the last 72 hours.    Imaging Studies: Ct Abdomen Pelvis W Contrast  Result Date: 03/21/2017 CLINICAL DATA:  Urinary frequency, diarrhea since last night with bright red blood today, suspected diverticulitis, essential hypertension, history peptic ulcer disease and hemorrhoids EXAM:  CT ABDOMEN AND PELVIS WITH CONTRAST TECHNIQUE: Multidetector CT imaging of the abdomen and pelvis was performed using the standard protocol following bolus administration of intravenous contrast. Sagittal and coronal MPR images reconstructed from axial data set. CONTRAST:  123mL ISOVUE-300 IOPAMIDOL (ISOVUE-300) INJECTION 61% IV. No oral contrast. COMPARISON:  09/23/2014 FINDINGS: Lower chest: Lung bases clear Hepatobiliary: Dependent increased attenuation within the gallbladder likely representing cholelithiasis. Gallbladder is distended without definite wall thickening or biliary dilatation. Liver unremarkable. Pancreas: Normal appearance Spleen: Normal appearance Adrenals/Urinary Tract: Thickening of LEFT adrenal gland with nodule in the medial limb 18 x 9 mm demonstrating significant washout on delayed images consistent with an adrenal adenoma. Thickening of LEFT adrenal gland without mass. Exophytic cyst posterior mid LEFT kidney 2.6 x 1.7 cm additional tiny cysts LEFT kidney. Intermediate attenuation nodule at posterior aspect of mid LEFT kidney 9 x 6 mm, slightly decreased in size since 2016 though slightly higher now in attenuation. No hydronephrosis, hydroureter, or urinary tract calcification. Bladder unremarkable. Stomach/Bowel: Diffuse wall thickening of the colon from distal transverse colon through sigmoid colon compatible with colitis. No colonic diverticular identified. Minimal pericolic stranding at descending colon and lateral Conal fascia. Proximal: Normal appearance. Normal appendix. Stomach and small bowel loops unremarkable. Duodenal and periduodenal changes seen on the previous exam resolved. Vascular/Lymphatic: Atherosclerotic calcification aorta and coronary arteries. Aorta normal caliber without aneurysm. No adenopathy. Reproductive: Unremarkable uterus and adnexa Other: No free air or free fluid.  No hernia. Musculoskeletal: Demineralized IMPRESSION: Colitis from distal transverse colon  through sigmoid colon ; differential diagnosis would include inflammatory bowel disease and infection, ischemia considered less likely. BILATERAL renal cysts including an intermediate attenuation cyst at the posterior RIGHT kidney which is minimally decreased in size since previous exam. Probable LEFT adrenal adenoma. Aortic Atherosclerosis (ICD10-I70.0). Electronically Signed   By: Lavonia Dana M.D.   On: 03/21/2017 12:27  [4 week]   Impression: 71 y/o female with acute onset left sided abd pain, diarrhea, blood in stool. Colitis on CT involving distal transverse colon through sigmoid colon. Would favor ischemic colitis given presentation. Risk factors include tobacco use. Clinically she looks good. Patient feels better. hgb normal.   Plan: 1. Would consider low residue diet.  2. Hopefully home in AM if continues to feel well. 3. F/u with Dr. Percell Miller as outpatient. Will leave to his discretion regarding if repeat colonoscopy needed.   We would like to thank you for the opportunity to participate in the  care of [facility].  Laureen Ochs. Bernarda Caffey [facility] [phone] 12/24/20183:56 PM     LOS: 0 days

## 2017-03-22 DIAGNOSIS — H4020X0 Unspecified primary angle-closure glaucoma, stage unspecified: Secondary | ICD-10-CM | POA: Diagnosis not present

## 2017-03-22 DIAGNOSIS — K219 Gastro-esophageal reflux disease without esophagitis: Secondary | ICD-10-CM | POA: Diagnosis not present

## 2017-03-22 DIAGNOSIS — F17210 Nicotine dependence, cigarettes, uncomplicated: Secondary | ICD-10-CM | POA: Diagnosis not present

## 2017-03-22 DIAGNOSIS — K921 Melena: Secondary | ICD-10-CM | POA: Diagnosis not present

## 2017-03-22 DIAGNOSIS — F17200 Nicotine dependence, unspecified, uncomplicated: Secondary | ICD-10-CM | POA: Diagnosis not present

## 2017-03-22 DIAGNOSIS — K529 Noninfective gastroenteritis and colitis, unspecified: Secondary | ICD-10-CM | POA: Diagnosis not present

## 2017-03-22 DIAGNOSIS — K55032 Diffuse acute (reversible) ischemia of large intestine: Secondary | ICD-10-CM | POA: Diagnosis not present

## 2017-03-22 DIAGNOSIS — I10 Essential (primary) hypertension: Secondary | ICD-10-CM | POA: Diagnosis not present

## 2017-03-22 DIAGNOSIS — E785 Hyperlipidemia, unspecified: Secondary | ICD-10-CM | POA: Diagnosis not present

## 2017-03-22 DIAGNOSIS — F411 Generalized anxiety disorder: Secondary | ICD-10-CM | POA: Diagnosis not present

## 2017-03-22 LAB — CBC WITH DIFFERENTIAL/PLATELET
BASOS ABS: 0.1 10*3/uL (ref 0.0–0.1)
Basophils Relative: 1 %
Eosinophils Absolute: 0.4 10*3/uL (ref 0.0–0.7)
Eosinophils Relative: 3 %
HCT: 37.4 % (ref 36.0–46.0)
HEMOGLOBIN: 12.4 g/dL (ref 12.0–15.0)
LYMPHS PCT: 23 %
Lymphs Abs: 3.1 10*3/uL (ref 0.7–4.0)
MCH: 31.6 pg (ref 26.0–34.0)
MCHC: 33.2 g/dL (ref 30.0–36.0)
MCV: 95.4 fL (ref 78.0–100.0)
Monocytes Absolute: 1.3 10*3/uL — ABNORMAL HIGH (ref 0.1–1.0)
Monocytes Relative: 9 %
NEUTROS ABS: 8.7 10*3/uL — AB (ref 1.7–7.7)
NEUTROS PCT: 64 %
PLATELETS: 393 10*3/uL (ref 150–400)
RBC: 3.92 MIL/uL (ref 3.87–5.11)
RDW: 12.7 % (ref 11.5–15.5)
WBC: 13.6 10*3/uL — AB (ref 4.0–10.5)

## 2017-03-22 LAB — COMPREHENSIVE METABOLIC PANEL
ALT: 16 U/L (ref 14–54)
AST: 21 U/L (ref 15–41)
Albumin: 3.6 g/dL (ref 3.5–5.0)
Alkaline Phosphatase: 62 U/L (ref 38–126)
Anion gap: 10 (ref 5–15)
BUN: 5 mg/dL — ABNORMAL LOW (ref 6–20)
CHLORIDE: 104 mmol/L (ref 101–111)
CO2: 23 mmol/L (ref 22–32)
Calcium: 9 mg/dL (ref 8.9–10.3)
Creatinine, Ser: 0.71 mg/dL (ref 0.44–1.00)
Glucose, Bld: 136 mg/dL — ABNORMAL HIGH (ref 65–99)
POTASSIUM: 3.7 mmol/L (ref 3.5–5.1)
SODIUM: 137 mmol/L (ref 135–145)
Total Bilirubin: 0.5 mg/dL (ref 0.3–1.2)
Total Protein: 7.4 g/dL (ref 6.5–8.1)

## 2017-03-22 LAB — MAGNESIUM: MAGNESIUM: 1.9 mg/dL (ref 1.7–2.4)

## 2017-03-22 MED ORDER — CIPROFLOXACIN HCL 500 MG PO TABS: 500.0000 mg | ORAL_TABLET | Freq: Two times a day (BID) | ORAL | 0 refills | Status: AC

## 2017-03-22 MED ORDER — METRONIDAZOLE 500 MG PO TABS: 500.0000 mg | ORAL_TABLET | Freq: Three times a day (TID) | ORAL | 0 refills | Status: AC

## 2017-03-22 NOTE — Discharge Summary (Signed)
Physician Discharge Summary  Lacey Christensen WJX:[id number] DOB: 11/03/1945 DOA: 03/21/2017  PCP: Claretta Fraise, MD GI: Dr.D. Percell Miller  Admit date: 03/21/2017 Discharge date: 03/22/2017  Admitted From: Home  Disposition: Home   Recommendations for Outpatient Follow-up:  1. Follow up with PCP in 1 weeks 2. Follow up with GI in 2 weeks 3. Please obtain BMP/CBC in one week  Discharge Condition: STABLE   CODE STATUS: FULL    Brief Hospitalization Summary: Please see all hospital notes, images, labs for full details of the hospitalization. Lacey Christensen is a 71 y.o. female with a hx of HTN and PUD who presented to the emergency department with complaint of abdominal pain since yesterday and bright red blood per rectum with BMs that started today. Patient states that yesterday she started having watery diarrhea, had about 5-10 episodes yesterday with some associated chills. Late afternoon yesterday she started to have some lower abdominal discomfort described as achy. States pain is somewhat relieved following urination, no other specific alleviating/aggravating factors. Has had increased urinary frequency. This morning had two BMs with bright red blood on toilet paper and in toilet bowl. Checked temperature at it was 100.2 therefore she took some Tylenol at 8:00. Denies nausea, vomiting, dysuria, vaginal bleeding, or vaginal discharge.  GI History Colorectal cancer screening: no adenomas to date, colon exams in 2001, 2008,2016 [place]: "good prep", 15 min WD time, no adenomas, Hyperplastic polyps, diverticulosis   Abdominal pain: [place] [facility] evaluation: 09/23/14 ER evaluation for abdominal pain, associated weight loss and bleeding 10/16/14: ER f/u office visit: "ok before June", increased BC powder use, better on BID Nexium, endo and colon recommended.   Duodenal narrowing: July 2016: A CT scan done in June demonstrated abnormality of the duodenum, i.e., a suspected inflammatory stricture  with an adjacent diverticulum. endoscopic evaluation by Dr Barney Drain EGD: 3 mm pyloric ulcer, pylorus narrowed, stricture at D1/D2, to tight to pass the adult endoscope, and "snug" with the pedi scope, bx neg for H pylori, no stomach contents mentioned. 01/24/15: Office visit f/u: Weight down 6 lbs since 09/2014, poor appetite, "not hungry", occ ASA, WBC 13,700, Hb 13.5, LFTs normal, TSH normal, lipase normal  ED Course: Pt had a CT scan that revealed colitis from distal transverse colon to sigmoid colon, plan for admission for abx. Patient started on Cipro/Flagyl.   1. Acute colitis -  GI was consulted and suspect that this is ischemic colitis due to smoking. Says to do low residue diet and 7 days of antibiotics.  DC home today with outpatient GI follow up.  Cipro/flagyl rxs given at discharge.  Hold zanaflex while on antibiotics (instructions given to patient) 2. Leukocytosis - WBC trending down.  Recheck outpatient followup.    3. Rectal bleeding - Pt does have rectal hemorrhoids and is hemoccult positive. 4. Essential hypertension - resume home medications and follow.  5. Glaucoma - resume home medications.  6. GERD - protonix ordered for GI protection.  7. Smoker - ordered nicotine patch and counselled to stop tobacco use.   GI consult notes: Impression: 71 y/o female with acute onset left sided abd pain, diarrhea, blood in stool. Colitis on CT involving distal transverse colon through sigmoid colon. Would favor ischemic colitis given presentation. Risk factors include tobacco use. Clinically she looks good. Patient feels better. hgb normal.   Plan: 1. Would consider low residue diet.  2. Hopefully home in AM if continues to feel well. 3. F/u with Dr. Percell Miller as outpatient. Will leave to his  discretion regarding if repeat colonoscopy needed.  Patient seen and examined. CT reviewed. Agree with above assessment and recommendations. Presentation and CT findings highly suggestive of  ischemic or segmental colitis.  Last bout of rectal bleeding O8 30 today. Antibiotic therapy in this setting may be superfluous. If she does well overnight, would advance to a low residue diet tomorrow morning. Okay to complete a seven-day course of antibiotics. She should stop smoking. Attestation signed by Daneil Dolin, MD at 03/21/2017 4:05 PM   Discharge Diagnoses:  Principal Problem:   Acute colitis Active Problems:   Rectal bleeding   Hyperlipidemia   Essential hypertension   Generalized anxiety disorder   Glaucoma   GERD (gastroesophageal reflux disease)   Current every day smoker  Discharge Instructions: Discharge Instructions    Call MD for:  difficulty breathing, headache or visual disturbances   Complete by:  As directed    Call MD for:  extreme fatigue   Complete by:  As directed    Call MD for:  persistant dizziness or light-headedness   Complete by:  As directed    Call MD for:  persistant nausea and vomiting   Complete by:  As directed    Call MD for:  severe uncontrolled pain   Complete by:  As directed    Increase activity slowly   Complete by:  As directed      Allergies as of 03/22/2017      Reactions   Corn-containing Products Itching   Sulfa Antibiotics Other (See Comments)   Childhood reaction-unknown   Timolol Other (See Comments)   depression   Tape Rash      Medication List    STOP taking these medications   tiZANidine 4 MG tablet Commonly known as:  ZANAFLEX     TAKE these medications   ALPRAZolam 0.25 MG tablet Commonly known as:  XANAX TAKE 1/2 TABLET 3 TIMES A DAY AS NEEDED FOR ANXIETY   CALCARB 600/D PO Take 1 tablet by mouth every evening.   ciprofloxacin 500 MG tablet Commonly known as:  CIPRO Take 1 tablet (500 mg total) by mouth 2 (two) times daily for 5 days. Start taking on:  03/23/2017   diphenhydrAMINE 25 mg capsule Commonly known as:  BENADRYL Take 25 mg by mouth every 6 (six) hours as needed for itching or  allergies.   dorzolamide 2 % ophthalmic solution Commonly known as:  TRUSOPT PLACE 1 DROP INTO THE RIGHT EYE 2 TIMES DAILY.   GOODY HEADACHE PO Take 1 packet by mouth daily as needed for headache.   Krill Oil 300 MG Caps Take 2 capsules (600 mg total) by mouth 2 (two) times daily.   latanoprost 0.005 % ophthalmic solution Commonly known as:  XALATAN 1 drop.   metroNIDAZOLE 500 MG tablet Commonly known as:  FLAGYL Take 1 tablet (500 mg total) by mouth 3 (three) times daily for 5 days. Start taking on:  03/23/2017   quinapril 40 MG tablet Commonly known as:  ACCUPRIL TAKE ONE TABLET BY MOUTH ONCE DAILY IN THE MORNING   verapamil 240 MG CR tablet Commonly known as:  CALAN-SR TAKE ONE TABLET BY MOUTH ONCE DAILY IN THE MORNING   Vitamin D 2000 units tablet Take 1 tablet (2,000 Units total) by mouth daily.      Follow-up Information    Stacks, Cletus Gash, MD. Schedule an appointment as soon as possible for a visit in 1 week(s).   Specialty:  Family Medicine Contact information: [place]  [place] [address] [phone]        Sonnie Alamo, MD Follow up in 2 week(s).   Specialty:  Internal Medicine Contact information: [place] [address] [place] [address] [phone]          Allergies  Allergen Reactions  . Corn-Containing Products Itching  . Sulfa Antibiotics Other (See Comments)    Childhood reaction-unknown  . Timolol Other (See Comments)    depression  . Tape Rash   Allergies as of 03/22/2017      Reactions   Corn-containing Products Itching   Sulfa Antibiotics Other (See Comments)   Childhood reaction-unknown   Timolol Other (See Comments)   depression   Tape Rash      Medication List    STOP taking these medications   tiZANidine 4 MG tablet Commonly known as:  ZANAFLEX     TAKE these medications   ALPRAZolam 0.25 MG tablet Commonly known as:  XANAX TAKE 1/2 TABLET 3 TIMES A DAY AS NEEDED FOR ANXIETY   CALCARB  600/D PO Take 1 tablet by mouth every evening.   ciprofloxacin 500 MG tablet Commonly known as:  CIPRO Take 1 tablet (500 mg total) by mouth 2 (two) times daily for 5 days. Start taking on:  03/23/2017   diphenhydrAMINE 25 mg capsule Commonly known as:  BENADRYL Take 25 mg by mouth every 6 (six) hours as needed for itching or allergies.   dorzolamide 2 % ophthalmic solution Commonly known as:  TRUSOPT PLACE 1 DROP INTO THE RIGHT EYE 2 TIMES DAILY.   GOODY HEADACHE PO Take 1 packet by mouth daily as needed for headache.   Krill Oil 300 MG Caps Take 2 capsules (600 mg total) by mouth 2 (two) times daily.   latanoprost 0.005 % ophthalmic solution Commonly known as:  XALATAN 1 drop.   metroNIDAZOLE 500 MG tablet Commonly known as:  FLAGYL Take 1 tablet (500 mg total) by mouth 3 (three) times daily for 5 days. Start taking on:  03/23/2017   quinapril 40 MG tablet Commonly known as:  ACCUPRIL TAKE ONE TABLET BY MOUTH ONCE DAILY IN THE MORNING   verapamil 240 MG CR tablet Commonly known as:  CALAN-SR TAKE ONE TABLET BY MOUTH ONCE DAILY IN THE MORNING   Vitamin D 2000 units tablet Take 1 tablet (2,000 Units total) by mouth daily.       Procedures/Studies: Ct Abdomen Pelvis W Contrast  Result Date: 03/21/2017 CLINICAL DATA:  Urinary frequency, diarrhea since last night with bright red blood today, suspected diverticulitis, essential hypertension, history peptic ulcer disease and hemorrhoids EXAM: CT ABDOMEN AND PELVIS WITH CONTRAST TECHNIQUE: Multidetector CT imaging of the abdomen and pelvis was performed using the standard protocol following bolus administration of intravenous contrast. Sagittal and coronal MPR images reconstructed from axial data set. CONTRAST:  162mL ISOVUE-300 IOPAMIDOL (ISOVUE-300) INJECTION 61% IV. No oral contrast. COMPARISON:  09/23/2014 FINDINGS: Lower chest: Lung bases clear Hepatobiliary: Dependent increased attenuation within the gallbladder  likely representing cholelithiasis. Gallbladder is distended without definite wall thickening or biliary dilatation. Liver unremarkable. Pancreas: Normal appearance Spleen: Normal appearance Adrenals/Urinary Tract: Thickening of LEFT adrenal gland with nodule in the medial limb 18 x 9 mm demonstrating significant washout on delayed images consistent with an adrenal adenoma. Thickening of LEFT adrenal gland without mass. Exophytic cyst posterior mid LEFT kidney 2.6 x 1.7 cm additional tiny cysts LEFT kidney. Intermediate attenuation nodule at posterior aspect of mid LEFT kidney 9 x  6 mm, slightly decreased in size since 2016 though slightly higher now in attenuation. No hydronephrosis, hydroureter, or urinary tract calcification. Bladder unremarkable. Stomach/Bowel: Diffuse wall thickening of the colon from distal transverse colon through sigmoid colon compatible with colitis. No colonic diverticular identified. Minimal pericolic stranding at descending colon and lateral Conal fascia. Proximal: Normal appearance. Normal appendix. Stomach and small bowel loops unremarkable. Duodenal and periduodenal changes seen on the previous exam resolved. Vascular/Lymphatic: Atherosclerotic calcification aorta and coronary arteries. Aorta normal caliber without aneurysm. No adenopathy. Reproductive: Unremarkable uterus and adnexa Other: No free air or free fluid.  No hernia. Musculoskeletal: Demineralized IMPRESSION: Colitis from distal transverse colon through sigmoid colon ; differential diagnosis would include inflammatory bowel disease and infection, ischemia considered less likely. BILATERAL renal cysts including an intermediate attenuation cyst at the posterior RIGHT kidney which is minimally decreased in size since previous exam. Probable LEFT adrenal adenoma. Aortic Atherosclerosis (ICD10-I70.0). Electronically Signed   By: Lavonia Dana M.D.   On: 03/21/2017 12:27     Subjective: Pt is feeling better and tolerated  diet, no further bloody stool, passing flatus. No emesis.   Discharge Exam: Vitals:   03/21/17 2245 03/22/17 0658  BP: (!) 142/64 (!) 145/66  Pulse: 96 87  Resp: 20 20  Temp: 99.2 F (37.3 C) 98.1 F (36.7 C)  SpO2: 96% 97%   Vitals:   03/21/17 1148 03/21/17 1420 03/21/17 2245 03/22/17 0658  BP:  (!) 126/5 (!) 142/64 (!) 145/66  Pulse: 73 71 96 87  Resp:   20 20  Temp:  98.7 F (37.1 C) 99.2 F (37.3 C) 98.1 F (36.7 C)  TempSrc:  Oral Oral Oral  SpO2: 95% 96% 96% 97%  Weight:  66.8 kg (147 lb 6 oz)    Height:  5' 4.5" (1.638 m)     General: Well-nourished, well-developed in no acute distress.  Head: Normocephalic, atraumatic.   Eyes: Conjunctiva pink, no icterus. Mouth: Oropharyngeal mucosa moist and pink , no lesions erythema or exudate. Neck: Supple without thyromegaly, masses, or lymphadenopathy.  Lungs: Clear to auscultation bilaterally.  Heart: Regular rate and rhythm, no murmurs rubs or gallops.  Abdomen: Bowel sounds are normal, nontender, nondistended, no hepatosplenomegaly or masses, no abdominal bruits or hernia , no rebound or guarding.   Extremities: No lower extremity edema, clubbing, deformity.  Neuro: Alert and oriented x 4 , grossly normal neurologically.  Skin: Warm and dry, no rash or jaundice.   Psych: Alert and cooperative, normal mood and affect.   The results of significant diagnostics from this hospitalization (including imaging, microbiology, ancillary and laboratory) are listed below for reference.     Microbiology: Recent Results (from the past 240 hour(s))  Culture, blood (Routine X 2) w Reflex to ID Panel     Status: None (Preliminary result)   Collection Time: 03/21/17  1:39 PM  Result Value Ref Range Status   Specimen Description BLOOD LEFT ARM  Final   Special Requests   Final    BOTTLES DRAWN AEROBIC AND ANAEROBIC Blood Culture results may not be optimal due to an excessive volume of blood received in culture bottles   Culture NO  GROWTH < 24 HOURS  Final   Report Status PENDING  Incomplete  Culture, blood (Routine X 2) w Reflex to ID Panel     Status: None (Preliminary result)   Collection Time: 03/21/17  1:39 PM  Result Value Ref Range Status   Specimen Description BLOOD RIGHT HAND  Final  Special Requests   Final    BOTTLES DRAWN AEROBIC AND ANAEROBIC Blood Culture adequate volume   Culture NO GROWTH < 24 HOURS  Final   Report Status PENDING  Incomplete     Labs: BNP (last 3 results) No results for input(s): BNP in the last 8760 hours. Basic Metabolic Panel: Recent Labs  Lab 03/21/17 1007 03/22/17 0544  NA 136 137  K 3.9 3.7  CL 104 104  CO2 21* 23  GLUCOSE 119* 136*  BUN 7 5*  CREATININE 0.73 0.71  CALCIUM 9.4 9.0  MG  --  1.9   Liver Function Tests: Recent Labs  Lab 03/21/17 1007 03/22/17 0544  AST 20 21  ALT 19 16  ALKPHOS 68 62  BILITOT 0.6 0.5  PROT 7.6 7.4  ALBUMIN 3.8 3.6   Recent Labs  Lab 03/21/17 1007  LIPASE 50   No results for input(s): AMMONIA in the last 168 hours. CBC: Recent Labs  Lab 03/21/17 1007 03/22/17 0544  WBC 16.5* 13.6*  NEUTROABS 12.3* 8.7*  HGB 13.1 12.4  HCT 39.7 37.4  MCV 94.5 95.4  PLT 423* 393   Cardiac Enzymes: No results for input(s): CKTOTAL, CKMB, CKMBINDEX, TROPONINI in the last 168 hours. BNP: Invalid input(s): POCBNP CBG: No results for input(s): GLUCAP in the last 168 hours. D-Dimer No results for input(s): DDIMER in the last 72 hours. Hgb A1c No results for input(s): HGBA1C in the last 72 hours. Lipid Profile No results for input(s): CHOL, HDL, LDLCALC, TRIG, CHOLHDL, LDLDIRECT in the last 72 hours. Thyroid function studies No results for input(s): TSH, T4TOTAL, T3FREE, THYROIDAB in the last 72 hours.  Invalid input(s): FREET3 Anemia work up No results for input(s): VITAMINB12, FOLATE, FERRITIN, TIBC, IRON, RETICCTPCT in the last 72 hours. Urinalysis    Component Value Date/Time   COLORURINE STRAW (A) 03/21/2017 1218    APPEARANCEUR CLEAR 03/21/2017 1218   LABSPEC 1.011 03/21/2017 1218   PHURINE 6.0 03/21/2017 [place] 03/21/2017 [place] NEGATIVE 03/21/2017 [place] 03/21/2017 1218   KETONESUR NEGATIVE 03/21/2017 1218   PROTEINUR NEGATIVE 03/21/2017 1218   UROBILINOGEN 0.2 09/23/2014 1209   NITRITE NEGATIVE 03/21/2017 1218   LEUKOCYTESUR NEGATIVE 03/21/2017 1218   Sepsis Labs Invalid input(s): PROCALCITONIN,  WBC,  LACTICIDVEN Microbiology Recent Results (from the past 240 hour(s))  Culture, blood (Routine X 2) w Reflex to ID Panel     Status: None (Preliminary result)   Collection Time: 03/21/17  1:39 PM  Result Value Ref Range Status   Specimen Description BLOOD LEFT ARM  Final   Special Requests   Final    BOTTLES DRAWN AEROBIC AND ANAEROBIC Blood Culture results may not be optimal due to an excessive volume of blood received in culture bottles   Culture NO GROWTH < 24 HOURS  Final   Report Status PENDING  Incomplete  Culture, blood (Routine X 2) w Reflex to ID Panel     Status: None (Preliminary result)   Collection Time: 03/21/17  1:39 PM  Result Value Ref Range Status   Specimen Description BLOOD RIGHT HAND  Final   Special Requests   Final    BOTTLES DRAWN AEROBIC AND ANAEROBIC Blood Culture adequate volume   Culture NO GROWTH < 24 HOURS  Final   Report Status PENDING  Incomplete   Time coordinating discharge: 32 mins  SIGNED:  Irwin Brakeman, MD  [facility] 03/22/2017, 9:31 AM Pager [phone]  If  7PM-7AM, please contact night-coverage [email or web address] Password [email or web address]

## 2017-03-22 NOTE — Discharge Instructions (Signed)
Soft Diet Recommended for 1-2 weeks. Please follow up with your gastroenterologist in 2 weeks. Please stop smoking as this increases your risk for ischemic colitis.  Stop taking Tizanidine (Zanaflex) while you are on the antibiotics.   Follow with Primary MD  Claretta Fraise, MD  and other consultant's as instructed your Hospitalist MD  Please get a complete blood count and chemistry panel checked by your Primary MD at your next visit, and again as instructed by your Primary MD.  Get Medicines reviewed and adjusted: Please take all your medications with you for your next visit with your Primary MD  Laboratory/radiological data: Please request your Primary MD to go over all hospital tests and procedure/radiological results at the follow up, please ask your Primary MD to get all Hospital records sent to his/her office.  In some cases, they will be blood work, cultures and biopsy results pending at the time of your discharge. Please request that your primary care M.D. follows up on these results.  Also Note the following: If you experience worsening of your admission symptoms, develop shortness of breath, life threatening emergency, suicidal or homicidal thoughts you must seek medical attention immediately by calling 911 or calling your MD immediately  if symptoms less severe.  You must read complete instructions/literature along with all the possible adverse reactions/side effects for all the Medicines you take and that have been prescribed to you. Take any new Medicines after you have completely understood and accpet all the possible adverse reactions/side effects.   Do not drive when taking Pain medications or sleeping medications (Benzodaizepines)  Do not take more than prescribed Pain, Sleep and Anxiety Medications. It is not advisable to combine anxiety,sleep and pain medications without talking with your primary care practitioner  Special Instructions: If you have smoked or chewed  Tobacco  in the last 2 yrs please stop smoking, stop any regular Alcohol  and or any Recreational drug use.  Wear Seat belts while driving.  Please note: You were cared for by a hospitalist during your hospital stay. Once you are discharged, your primary care physician will handle any further medical issues. Please note that NO REFILLS for any discharge medications will be authorized once you are discharged, as it is imperative that you return to your primary care physician (or establish a relationship with a primary care physician if you do not have one) for your post hospital discharge needs so that they can reassess your need for medications and monitor your lab values.

## 2017-03-22 NOTE — Progress Notes (Signed)
Patient is to be discharged home and in stable condition. IV removed, WNL. Patient given discharge instructions and verbalized understanding. Patient will be escorted out by staff via wheelchair.  Celestia Khat, RN

## 2017-03-23 LAB — URINE CULTURE: Culture: NO GROWTH

## 2017-03-26 LAB — CULTURE, BLOOD (ROUTINE X 2)
CULTURE: NO GROWTH
CULTURE: NO GROWTH
SPECIAL REQUESTS: ADEQUATE

## 2017-04-06 ENCOUNTER — Ambulatory Visit (INDEPENDENT_AMBULATORY_CARE_PROVIDER_SITE_OTHER): Payer: Medicare Other | Admitting: Family Medicine

## 2017-04-06 ENCOUNTER — Encounter: Payer: Self-pay | Admitting: Family Medicine

## 2017-04-06 VITALS — BP 112/62 | HR 64 | Temp 97.0°F | Ht 64.5 in | Wt 143.0 lb

## 2017-04-06 DIAGNOSIS — R197 Diarrhea, unspecified: Secondary | ICD-10-CM | POA: Diagnosis not present

## 2017-04-06 DIAGNOSIS — I10 Essential (primary) hypertension: Secondary | ICD-10-CM

## 2017-04-06 DIAGNOSIS — K909 Intestinal malabsorption, unspecified: Secondary | ICD-10-CM | POA: Diagnosis not present

## 2017-04-06 DIAGNOSIS — K559 Vascular disorder of intestine, unspecified: Secondary | ICD-10-CM

## 2017-04-06 MED ORDER — ALPRAZOLAM 0.25 MG PO TABS: ORAL_TABLET | ORAL | 2 refills | Status: DC

## 2017-04-06 NOTE — Progress Notes (Signed)
Subjective:  Patient ID: Lacey Christensen, female    DOB: [birth year]  Age: 72 y.o. MRN: [id number]  CC: Hospitalization Follow-up (pt here today following up after going to ED at [facility] 03/21/17 and was dx'd with ischemic colitis. She has GI appt 04/08/17 and pt has continued her soft food diet and hasn't noticed any blood in stool or had any pain with BM.)   HPI Lacey Christensen presents for hospital follow-up due to bright red blood per rectum the day before Christmas that would be 2 weeks and 2 days ago.  She had several bowel movements that day with blood in them.  The hospital record has been reviewed in detail.  She was not in distress but was passing significant enough blood that CT scan was done and this showed ischemic colitis.  She was started on Cipro and Flagyl after IV antibiotics at the hospital.  Her diarrhea resolved after a day.  However she just felt bad from the use of the antibiotic.  Once she finished the 5-day treatment at home she bounced back and is felt good since that time.  She has made arrangements to see her gastroenterologist Dr. Amada Jupiter who is located at [facility] in [place].  She has plans to see him in 2 days.  Her hospital gastroenterologist was Dr. Buford Dresser and he felt that she did not need a colonoscopy at that time due to having had one just 2 years before that was clean.  However there was a handout given to her that she brings in today showing that ischemic colitis was diagnosed with a colonoscopy.  Due to this discrepancy were going to let Dr. Percell Miller weigh in on whether she needs a repeat of her colonoscopy or not.  She also has requests that she see her PCP and get blood work at 2 weeks.  Currently she is on a regular diet she is not fasting today.  She is not passing blood and has normal amounts of energy.  She is taking medications as noted below.  No GI pain or distress.  Depression screen [facility] 2/9 12/08/2016 03/08/2016 12/03/2015  Decreased Interest 0 2 0  Down,  Depressed, Hopeless 0 2 0  PHQ - 2 Score 0 4 0  Altered sleeping - 0 -  Tired, decreased energy - 2 -  Change in appetite - 0 -  Feeling bad or failure about yourself  - 2 -  Trouble concentrating - 2 -  Moving slowly or fidgety/restless - 0 -  Suicidal thoughts - 1 -  PHQ-9 Score - 11 -    History Lacey Christensen has a past medical history of Hypertension, Narrow angle glaucoma suspect of both eyes, and PUD (peptic ulcer disease).   She has a past surgical history that includes dilitation and curritage; Colonoscopy (N/A, 10/24/2014); and Esophagogastroduodenoscopy (N/A, 10/24/2014).   Her family history includes Diabetes in her mother; Hypertension in her father and mother.She reports that she has been smoking cigarettes.  She has a 25.00 pack-year smoking history. she has never used smokeless tobacco. She reports that she does not drink alcohol. Her drug history is not on Christensen.    ROS Review of Systems  Constitutional: Negative for activity change, appetite change and fever.  HENT: Negative for congestion, rhinorrhea and sore throat.   Eyes: Negative for visual disturbance.  Respiratory: Negative for cough and shortness of breath.   Cardiovascular: Negative for chest pain and palpitations.  Gastrointestinal: Negative for abdominal pain, diarrhea and nausea.  Genitourinary: Negative for dysuria.  Musculoskeletal: Negative for arthralgias and myalgias.    Objective:  BP 112/62   Pulse 64   Temp (!) 97 F (36.1 C) (Oral)   Ht 5' 4.5" (1.638 m)   Wt 143 lb (64.9 kg)   BMI 24.17 kg/m   BP Readings from Last 3 Encounters:  04/06/17 112/62  03/22/17 (!) 145/66  12/08/16 129/79    Wt Readings from Last 3 Encounters:  04/06/17 143 lb (64.9 kg)  03/21/17 147 lb 6 oz (66.8 kg)  12/08/16 145 lb (65.8 kg)     Physical Exam  Constitutional: She is oriented to person, place, and time. She appears well-developed and well-nourished. No distress.  HENT:  Head: Normocephalic and  atraumatic.  Right Ear: External ear normal.  Left Ear: External ear normal.  Nose: Nose normal.  Mouth/Throat: Oropharynx is clear and moist.  Eyes: Conjunctivae and EOM are normal. Pupils are equal, round, and reactive to light.  Neck: Normal range of motion. Neck supple. No thyromegaly present.  Cardiovascular: Normal rate, regular rhythm and normal heart sounds.  No murmur heard. Pulmonary/Chest: Effort normal and breath sounds normal. No respiratory distress. She has no wheezes. She has no rales.  Abdominal: Soft. Bowel sounds are normal. She exhibits no distension. There is no tenderness.  Lymphadenopathy:    She has no cervical adenopathy.  Neurological: She is alert and oriented to person, place, and time. She has normal reflexes.  Skin: Skin is warm and dry.  Psychiatric: She has a normal mood and affect. Her behavior is normal. Judgment and thought content normal.      Assessment & Plan:   Lacey Christensen was seen today for hospitalization follow-up.  Diagnoses and all orders for this visit:  Ischemic colitis ([place]) -     CBC with Differential/Platelet -     CMP14+EGFR  Diarrhea due to malabsorption  Essential hypertension -     CBC with Differential/Platelet -     CMP14+EGFR  Other orders -     ALPRAZolam (XANAX) 0.25 MG tablet; TAKE 1/2 TABLET 3 TIMES A DAY AS NEEDED FOR ANXIETY       I am having Lacey Christensen maintain her Calcium Carbonate-Vitamin D (CALCARB 600/D PO), diphenhydrAMINE, latanoprost, dorzolamide, Krill Oil, Vitamin D, quinapril, verapamil, Aspirin-Acetaminophen-Caffeine (GOODY HEADACHE PO), and ALPRAZolam.  Allergies as of 04/06/2017      Reactions   Corn-containing Products Itching   Sulfa Antibiotics Other (See Comments)   Childhood reaction-unknown   Timolol Other (See Comments)   depression   Tape Rash      Medication List        Accurate as of 04/06/17  2:27 PM. Always use your most recent med list.          ALPRAZolam 0.25 MG  tablet Commonly known as:  XANAX TAKE 1/2 TABLET 3 TIMES A DAY AS NEEDED FOR ANXIETY   CALCARB 600/D PO Take 1 tablet by mouth every evening.   diphenhydrAMINE 25 mg capsule Commonly known as:  BENADRYL Take 25 mg by mouth every 6 (six) hours as needed for itching or allergies.   dorzolamide 2 % ophthalmic solution Commonly known as:  TRUSOPT PLACE 1 DROP INTO THE RIGHT EYE 2 TIMES DAILY.   GOODY HEADACHE PO Take 1 packet by mouth daily as needed for headache.   Krill Oil 300 MG Caps Take 2 capsules (600 mg total) by mouth 2 (two) times daily.   latanoprost 0.005 % ophthalmic solution Commonly known as:  XALATAN 1 drop.   quinapril 40 MG tablet Commonly known as:  ACCUPRIL TAKE ONE TABLET BY MOUTH ONCE DAILY IN THE MORNING   verapamil 240 MG CR tablet Commonly known as:  CALAN-SR TAKE ONE TABLET BY MOUTH ONCE DAILY IN THE MORNING   Vitamin D 2000 units tablet Take 1 tablet (2,000 Units total) by mouth daily.        Follow-up: Return in about 3 months (around 07/05/2017).  Claretta Fraise, M.D.

## 2017-04-07 LAB — CBC WITH DIFFERENTIAL/PLATELET
Basophils Absolute: 0.1 10*3/uL (ref 0.0–0.2)
Basos: 1 %
EOS (ABSOLUTE): 0.6 10*3/uL — AB (ref 0.0–0.4)
Eos: 6 %
Hematocrit: 40.5 % (ref 34.0–46.6)
Hemoglobin: 13.4 g/dL (ref 11.1–15.9)
IMMATURE GRANS (ABS): 0 10*3/uL (ref 0.0–0.1)
Immature Granulocytes: 0 %
LYMPHS ABS: 3.6 10*3/uL — AB (ref 0.7–3.1)
LYMPHS: 35 %
MCH: 31.3 pg (ref 26.6–33.0)
MCHC: 33.1 g/dL (ref 31.5–35.7)
MCV: 95 fL (ref 79–97)
MONOS ABS: 1 10*3/uL — AB (ref 0.1–0.9)
Monocytes: 10 %
NEUTROS ABS: 5 10*3/uL (ref 1.4–7.0)
Neutrophils: 48 %
PLATELETS: 545 10*3/uL — AB (ref 150–379)
RBC: 4.28 x10E6/uL (ref 3.77–5.28)
RDW: 13.8 % (ref 12.3–15.4)
WBC: 10.3 10*3/uL (ref 3.4–10.8)

## 2017-04-07 LAB — CMP14+EGFR
A/G RATIO: 1.4 (ref 1.2–2.2)
ALK PHOS: 68 IU/L (ref 39–117)
ALT: 24 IU/L (ref 0–32)
AST: 22 IU/L (ref 0–40)
Albumin: 4.4 g/dL (ref 3.5–4.8)
BILIRUBIN TOTAL: 0.2 mg/dL (ref 0.0–1.2)
BUN / CREAT RATIO: 15 (ref 12–28)
BUN: 11 mg/dL (ref 8–27)
CHLORIDE: 100 mmol/L (ref 96–106)
CO2: 24 mmol/L (ref 20–29)
Calcium: 10.3 mg/dL (ref 8.7–10.3)
Creatinine, Ser: 0.71 mg/dL (ref 0.57–1.00)
GFR calc non Af Amer: 86 mL/min/{1.73_m2} (ref >59)
GFR, EST AFRICAN AMERICAN: 99 mL/min/{1.73_m2} (ref >59)
Globulin, Total: 3.2 g/dL (ref 1.5–4.5)
Glucose: 72 mg/dL (ref 65–99)
POTASSIUM: 5.2 mmol/L (ref 3.5–5.2)
Sodium: 137 mmol/L (ref 134–144)
TOTAL PROTEIN: 7.6 g/dL (ref 6.0–8.5)

## 2017-04-08 DIAGNOSIS — R933 Abnormal findings on diagnostic imaging of other parts of digestive tract: Secondary | ICD-10-CM | POA: Diagnosis not present

## 2017-04-08 DIAGNOSIS — Z09 Encounter for follow-up examination after completed treatment for conditions other than malignant neoplasm: Secondary | ICD-10-CM | POA: Diagnosis not present

## 2017-04-20 DIAGNOSIS — N814 Uterovaginal prolapse, unspecified: Secondary | ICD-10-CM | POA: Diagnosis not present

## 2017-04-20 DIAGNOSIS — Z1231 Encounter for screening mammogram for malignant neoplasm of breast: Secondary | ICD-10-CM | POA: Diagnosis not present

## 2017-04-20 DIAGNOSIS — F17210 Nicotine dependence, cigarettes, uncomplicated: Secondary | ICD-10-CM | POA: Diagnosis not present

## 2017-04-20 DIAGNOSIS — Z01419 Encounter for gynecological examination (general) (routine) without abnormal findings: Secondary | ICD-10-CM | POA: Diagnosis not present

## 2017-05-02 ENCOUNTER — Other Ambulatory Visit: Payer: Self-pay | Admitting: *Deleted

## 2017-05-02 MED ORDER — QUINAPRIL HCL 40 MG PO TABS: ORAL_TABLET | ORAL | 1 refills | Status: DC

## 2017-05-18 DIAGNOSIS — H401121 Primary open-angle glaucoma, left eye, mild stage: Secondary | ICD-10-CM | POA: Diagnosis not present

## 2017-05-18 DIAGNOSIS — Z1231 Encounter for screening mammogram for malignant neoplasm of breast: Secondary | ICD-10-CM | POA: Diagnosis not present

## 2017-05-18 DIAGNOSIS — H401113 Primary open-angle glaucoma, right eye, severe stage: Secondary | ICD-10-CM | POA: Diagnosis not present

## 2017-06-07 ENCOUNTER — Ambulatory Visit: Payer: Medicare Other | Admitting: Family Medicine

## 2017-06-07 ENCOUNTER — Other Ambulatory Visit: Payer: Self-pay | Admitting: Family Medicine

## 2017-06-09 ENCOUNTER — Telehealth: Payer: Self-pay | Admitting: Family Medicine

## 2017-06-09 MED ORDER — VERAPAMIL HCL ER 240 MG PO TBCR: 240.0000 mg | EXTENDED_RELEASE_TABLET | Freq: Every day | ORAL | 1 refills | Status: DC

## 2017-06-09 NOTE — Telephone Encounter (Signed)
90 day supply sent to pharmacy

## 2017-07-05 ENCOUNTER — Encounter: Payer: Self-pay | Admitting: Family Medicine

## 2017-07-05 ENCOUNTER — Ambulatory Visit (INDEPENDENT_AMBULATORY_CARE_PROVIDER_SITE_OTHER): Payer: Medicare Other | Admitting: Family Medicine

## 2017-07-05 VITALS — BP 136/80 | HR 98 | Temp 97.9°F | Ht 64.5 in | Wt 149.1 lb

## 2017-07-05 DIAGNOSIS — I10 Essential (primary) hypertension: Secondary | ICD-10-CM

## 2017-07-05 DIAGNOSIS — Z1159 Encounter for screening for other viral diseases: Secondary | ICD-10-CM | POA: Diagnosis not present

## 2017-07-05 DIAGNOSIS — K559 Vascular disorder of intestine, unspecified: Secondary | ICD-10-CM | POA: Diagnosis not present

## 2017-07-05 DIAGNOSIS — E782 Mixed hyperlipidemia: Secondary | ICD-10-CM

## 2017-07-05 DIAGNOSIS — F411 Generalized anxiety disorder: Secondary | ICD-10-CM | POA: Diagnosis not present

## 2017-07-05 MED ORDER — ALPRAZOLAM 0.25 MG PO TABS: ORAL_TABLET | ORAL | 5 refills | Status: DC

## 2017-07-06 ENCOUNTER — Encounter: Payer: Self-pay | Admitting: Family Medicine

## 2017-07-06 LAB — CBC WITH DIFFERENTIAL/PLATELET
Basophils Absolute: 0.1 10*3/uL (ref 0.0–0.2)
Basos: 1 %
EOS (ABSOLUTE): 0.6 10*3/uL — ABNORMAL HIGH (ref 0.0–0.4)
Eos: 7 %
HEMATOCRIT: 41 % (ref 34.0–46.6)
Hemoglobin: 13.8 g/dL (ref 11.1–15.9)
IMMATURE GRANS (ABS): 0 10*3/uL (ref 0.0–0.1)
IMMATURE GRANULOCYTES: 0 %
LYMPHS: 37 %
Lymphocytes Absolute: 3.4 10*3/uL — ABNORMAL HIGH (ref 0.7–3.1)
MCH: 32.3 pg (ref 26.6–33.0)
MCHC: 33.7 g/dL (ref 31.5–35.7)
MCV: 96 fL (ref 79–97)
MONOS ABS: 0.8 10*3/uL (ref 0.1–0.9)
Monocytes: 8 %
NEUTROS PCT: 47 %
Neutrophils Absolute: 4.2 10*3/uL (ref 1.4–7.0)
PLATELETS: 420 10*3/uL — AB (ref 150–379)
RBC: 4.27 x10E6/uL (ref 3.77–5.28)
RDW: 14.6 % (ref 12.3–15.4)
WBC: 9 10*3/uL (ref 3.4–10.8)

## 2017-07-06 LAB — CMP14+EGFR
ALT: 23 IU/L (ref 0–32)
AST: 25 IU/L (ref 0–40)
Albumin/Globulin Ratio: 1.7 (ref 1.2–2.2)
Albumin: 4.5 g/dL (ref 3.5–4.8)
Alkaline Phosphatase: 67 IU/L (ref 39–117)
BUN/Creatinine Ratio: 13 (ref 12–28)
BUN: 8 mg/dL (ref 8–27)
Bilirubin Total: <0.2 mg/dL (ref 0.0–1.2)
CALCIUM: 10 mg/dL (ref 8.7–10.3)
CO2: 18 mmol/L — AB (ref 20–29)
CREATININE: 0.62 mg/dL (ref 0.57–1.00)
Chloride: 104 mmol/L (ref 96–106)
GFR calc Af Amer: 105 mL/min/{1.73_m2} (ref >59)
GFR, EST NON AFRICAN AMERICAN: 91 mL/min/{1.73_m2} (ref >59)
Globulin, Total: 2.7 g/dL (ref 1.5–4.5)
Glucose: 98 mg/dL (ref 65–99)
Potassium: 5.1 mmol/L (ref 3.5–5.2)
Sodium: 144 mmol/L (ref 134–144)
TOTAL PROTEIN: 7.2 g/dL (ref 6.0–8.5)

## 2017-07-06 LAB — HEPATITIS C ANTIBODY: Hep C Virus Ab: <0.1 s/co ratio (ref 0.0–0.9)

## 2017-07-06 MED ORDER — ALPRAZOLAM 0.25 MG PO TABS: ORAL_TABLET | ORAL | 5 refills | Status: DC

## 2017-07-06 NOTE — Progress Notes (Signed)
Subjective:  Patient ID: Lacey Christensen, female    DOB: 01-Mar-1946  Age: 72 y.o. MRN: [id number]  CC: Hypertension (pt here today for routine follow up of her chronic medical conditions)   HPI Lacey Christensen presents for  follow-up of hypertension. Patient has no history of headache chest pain or shortness of breath or recent cough. Patient also denies symptoms of TIA such as focal numbness or weakness. Patient denies side effects from medication. States taking it regularly. Although diagnosed with elevated cholesterol in the past she has been resistant to treatment she did try to cut back on the fats in diet she wants to know if that made a difference by checking again today.  She notes that she has not been screened for hepatitis C and would like to have that test performed as well.  She still fights with anxiety.  Her symptoms are usually nervousness and worry and atypical tightness in her chest at times.  This is relieved by her Xanax.  She would like to wean off of it though.  She has been taking it for many years and wants to move slowly.  We talked about reducing by a maximum of 1/2 tablet/day/month  History Lacey Christensen has a past medical history of Hypertension, Narrow angle glaucoma suspect of both eyes, and PUD (peptic ulcer disease).   She has a past surgical history that includes dilitation and curritage; Colonoscopy (N/A, 10/24/2014); and Esophagogastroduodenoscopy (N/A, 10/24/2014).   Her family history includes Diabetes in her mother; Hypertension in her father and mother.She reports that she has been smoking cigarettes.  She has a 25.00 pack-year smoking history. She has never used smokeless tobacco. She reports that she does not drink alcohol. Her drug history is not on file.  Current Outpatient Medications on File Prior to Visit  Medication Sig Dispense Refill  . Aspirin-Acetaminophen-Caffeine (GOODY HEADACHE PO) Take 1 packet by mouth daily as needed for headache.    . Calcium  Carbonate-Vitamin D (CALCARB 600/D PO) Take 1 tablet by mouth every evening.    . Cholecalciferol (VITAMIN D) 2000 units tablet Take 1 tablet (2,000 Units total) by mouth daily. 30 tablet 11  . diphenhydrAMINE (BENADRYL) 25 mg capsule Take 25 mg by mouth every 6 (six) hours as needed for itching or allergies.    Marland Kitchen dorzolamide (TRUSOPT) 2 % ophthalmic solution PLACE 1 DROP INTO THE RIGHT EYE 2 TIMES DAILY.  11  . esomeprazole (NEXIUM) 20 MG capsule Take by mouth.    Javier Docker Oil 300 MG CAPS Take 2 capsules (600 mg total) by mouth 2 (two) times daily. 120 capsule 11  . latanoprost (XALATAN) 0.005 % ophthalmic solution 1 drop.    . Multiple Vitamin (MULTIVITAMIN) tablet Take 1 tablet by mouth daily.    . quinapril (ACCUPRIL) 40 MG tablet TAKE ONE TABLET BY MOUTH ONCE DAILY IN THE MORNING 90 tablet 1  . verapamil (CALAN-SR) 240 MG CR tablet Take 1 tablet (240 mg total) by mouth daily. 90 tablet 1   No current facility-administered medications on file prior to visit.     ROS Review of Systems  Constitutional: Negative.   HENT: Negative for congestion.   Eyes: Negative for visual disturbance.  Respiratory: Negative for shortness of breath.   Cardiovascular: Negative for chest pain.  Gastrointestinal: Negative for abdominal pain, constipation, diarrhea, nausea and vomiting.  Genitourinary: Negative for difficulty urinating.  Musculoskeletal: Negative for arthralgias and myalgias.  Neurological: Negative for headaches.  Psychiatric/Behavioral: Negative for sleep disturbance.  Objective:  BP 136/80   Pulse 98   Temp 97.9 F (36.6 C) (Oral)   Ht 5' 4.5" (1.638 m)   Wt 149 lb 2 oz (67.6 kg)   BMI 25.20 kg/m   BP Readings from Last 3 Encounters:  07/05/17 136/80  04/06/17 112/62  03/22/17 (!) 145/66    Wt Readings from Last 3 Encounters:  07/05/17 149 lb 2 oz (67.6 kg)  04/06/17 143 lb (64.9 kg)  03/21/17 147 lb 6 oz (66.8 kg)     Physical Exam  Constitutional: She is  oriented to person, place, and time. She appears well-developed and well-nourished. No distress.  HENT:  Head: Normocephalic and atraumatic.  Right Ear: External ear normal.  Left Ear: External ear normal.  Nose: Nose normal.  Mouth/Throat: Oropharynx is clear and moist.  Eyes: Pupils are equal, round, and reactive to light. Conjunctivae and EOM are normal.  Neck: Normal range of motion. Neck supple. No thyromegaly present.  Cardiovascular: Normal rate, regular rhythm and normal heart sounds.  No murmur heard. Pulmonary/Chest: Effort normal and breath sounds normal. No respiratory distress. She has no wheezes. She has no rales.  Abdominal: Soft. Bowel sounds are normal. She exhibits no distension. There is no tenderness.  Lymphadenopathy:    She has no cervical adenopathy.  Neurological: She is alert and oriented to person, place, and time. She has normal reflexes.  Skin: Skin is warm and dry.  Psychiatric: She has a normal mood and affect. Her behavior is normal. Judgment and thought content normal.      Assessment & Plan:   [name] was seen today for hypertension.  Diagnoses and all orders for this visit:  Essential hypertension -     CBC with Differential/Platelet -     CMP14+EGFR  Need for hepatitis C screening test -     Hepatitis C antibody  Generalized anxiety disorder  Ischemic colitis ([place])  Mixed hyperlipidemia  Other orders -     Cancel: HIV antibody -     Discontinue: ALPRAZolam (XANAX) 0.25 MG tablet; TAKE 1/2 TABLET 3 TIMES A DAY AS NEEDED FOR ANXIETY -     ALPRAZolam (XANAX) 0.25 MG tablet; TAKE 1/2 TABLET 3 TIMES A DAY AS NEEDED FOR ANXIETY   Allergies as of 07/05/2017      Reactions   Corn-containing Products Itching   Sulfa Antibiotics Other (See Comments)   Childhood reaction-unknown   Timolol Other (See Comments)   depression   Tape Rash      Medication List        Accurate as of 07/05/17 11:59 PM. Always use your most recent med list.            ALPRAZolam 0.25 MG tablet Commonly known as:  XANAX TAKE 1/2 TABLET 3 TIMES A DAY AS NEEDED FOR ANXIETY   CALCARB 600/D PO Take 1 tablet by mouth every evening.   diphenhydrAMINE 25 mg capsule Commonly known as:  BENADRYL Take 25 mg by mouth every 6 (six) hours as needed for itching or allergies.   dorzolamide 2 % ophthalmic solution Commonly known as:  TRUSOPT PLACE 1 DROP INTO THE RIGHT EYE 2 TIMES DAILY.   esomeprazole 20 MG capsule Commonly known as:  NEXIUM Take by mouth.   GOODY HEADACHE PO Take 1 packet by mouth daily as needed for headache.   Krill Oil 300 MG Caps Take 2 capsules (600 mg total) by mouth 2 (two) times daily.   latanoprost 0.005 % ophthalmic solution Commonly  known as:  XALATAN 1 drop.   multivitamin tablet Take 1 tablet by mouth daily.   quinapril 40 MG tablet Commonly known as:  ACCUPRIL TAKE ONE TABLET BY MOUTH ONCE DAILY IN THE MORNING   verapamil 240 MG CR tablet Commonly known as:  CALAN-SR Take 1 tablet (240 mg total) by mouth daily.   Vitamin D 2000 units tablet Take 1 tablet (2,000 Units total) by mouth daily.       Meds ordered this encounter  Medications  . DISCONTD: ALPRAZolam (XANAX) 0.25 MG tablet    Sig: TAKE 1/2 TABLET 3 TIMES A DAY AS NEEDED FOR ANXIETY    Dispense:  45 tablet    Refill:  5    This request is for a new prescription for a controlled substance as required by Federal/State law.  . ALPRAZolam (XANAX) 0.25 MG tablet    Sig: TAKE 1/2 TABLET 3 TIMES A DAY AS NEEDED FOR ANXIETY    Dispense:  45 tablet    Refill:  5    This request is for a new prescription for a controlled substance as required by Federal/State law.      Follow-up: Return in about 6 months (around 01/04/2018).  Claretta Fraise, M.D.

## 2017-07-26 DIAGNOSIS — H40053 Ocular hypertension, bilateral: Secondary | ICD-10-CM | POA: Diagnosis not present

## 2017-07-26 DIAGNOSIS — H2513 Age-related nuclear cataract, bilateral: Secondary | ICD-10-CM | POA: Diagnosis not present

## 2017-07-26 DIAGNOSIS — H40033 Anatomical narrow angle, bilateral: Secondary | ICD-10-CM | POA: Diagnosis not present

## 2017-10-06 DIAGNOSIS — H401121 Primary open-angle glaucoma, left eye, mild stage: Secondary | ICD-10-CM | POA: Diagnosis not present

## 2017-10-06 DIAGNOSIS — Z79899 Other long term (current) drug therapy: Secondary | ICD-10-CM | POA: Diagnosis not present

## 2017-10-06 DIAGNOSIS — H401113 Primary open-angle glaucoma, right eye, severe stage: Secondary | ICD-10-CM | POA: Diagnosis not present

## 2017-10-21 ENCOUNTER — Telehealth: Payer: Self-pay | Admitting: Family Medicine

## 2017-10-26 DIAGNOSIS — H25812 Combined forms of age-related cataract, left eye: Secondary | ICD-10-CM | POA: Diagnosis not present

## 2017-10-26 DIAGNOSIS — H2512 Age-related nuclear cataract, left eye: Secondary | ICD-10-CM | POA: Diagnosis not present

## 2017-11-01 ENCOUNTER — Other Ambulatory Visit: Payer: Self-pay | Admitting: Family Medicine

## 2017-12-08 DIAGNOSIS — H2511 Age-related nuclear cataract, right eye: Secondary | ICD-10-CM | POA: Diagnosis not present

## 2017-12-08 DIAGNOSIS — H401113 Primary open-angle glaucoma, right eye, severe stage: Secondary | ICD-10-CM | POA: Diagnosis not present

## 2017-12-19 DIAGNOSIS — Z01818 Encounter for other preprocedural examination: Secondary | ICD-10-CM | POA: Diagnosis not present

## 2017-12-19 DIAGNOSIS — H25811 Combined forms of age-related cataract, right eye: Secondary | ICD-10-CM | POA: Diagnosis not present

## 2017-12-19 DIAGNOSIS — H2511 Age-related nuclear cataract, right eye: Secondary | ICD-10-CM | POA: Diagnosis not present

## 2018-01-04 ENCOUNTER — Ambulatory Visit (INDEPENDENT_AMBULATORY_CARE_PROVIDER_SITE_OTHER): Payer: Medicare Other | Admitting: Family Medicine

## 2018-01-04 ENCOUNTER — Encounter: Payer: Self-pay | Admitting: Family Medicine

## 2018-01-04 VITALS — BP 137/73 | HR 81 | Temp 97.4°F | Ht 64.5 in | Wt 148.0 lb

## 2018-01-04 DIAGNOSIS — K219 Gastro-esophageal reflux disease without esophagitis: Secondary | ICD-10-CM

## 2018-01-04 DIAGNOSIS — I10 Essential (primary) hypertension: Secondary | ICD-10-CM

## 2018-01-04 DIAGNOSIS — F411 Generalized anxiety disorder: Secondary | ICD-10-CM | POA: Diagnosis not present

## 2018-01-04 DIAGNOSIS — F17200 Nicotine dependence, unspecified, uncomplicated: Secondary | ICD-10-CM | POA: Diagnosis not present

## 2018-01-04 DIAGNOSIS — E782 Mixed hyperlipidemia: Secondary | ICD-10-CM

## 2018-01-04 MED ORDER — ALPRAZOLAM 0.25 MG PO TABS: ORAL_TABLET | ORAL | 5 refills | Status: DC

## 2018-01-04 NOTE — Addendum Note (Signed)
Addended by: Nigel Berthold C on: 01/04/2018 04:55 PM   Modules accepted: Orders

## 2018-01-04 NOTE — Progress Notes (Signed)
Subjective:  Patient ID: Lacey Christensen, female    DOB: 1945-07-05  Age: 72 y.o. MRN: [id number]  CC: Medical Management of Chronic Issues   HPI Asharia Lotter presents for  follow-up of hypertension. Patient has no history of headache chest pain or shortness of breath or recent cough. Patient also denies symptoms of TIA such as focal numbness or weakness. Patient denies side effects from medication. States taking it regularly. Patient in for follow-up of GERD. Currently asymptomatic taking  PPI daily. There is no chest pain or heartburn. No hematemesis and no melena. No dysphagia or choking. Onset is remote. Progression is stable. Complicating factors, none. Patient also followed for cholesterol.  At this point she is taking Krill oil regularly.  She denies any GI symptoms.  Specifically no GI bleeding.  She is also taking her Nexium every other day.  Patient says that she daily has no desire to get out to go places.  She just thinks about things too much sometimes.  She is taking the Xanax as needed. Depression screen [facility] 2/9 01/04/2018 07/05/2017 12/08/2016 03/08/2016 12/03/2015  Decreased Interest 0 0 0 2 0  Down, Depressed, Hopeless 0 0 0 2 0  PHQ - 2 Score 0 0 0 4 0  Altered sleeping - - - 0 -  Tired, decreased energy - - - 2 -  Change in appetite - - - 0 -  Feeling bad or failure about yourself  - - - 2 -  Trouble concentrating - - - 2 -  Moving slowly or fidgety/restless - - - 0 -  Suicidal thoughts - - - 1 -  PHQ-9 Score - - - 11 -    History Amita has a past medical history of Cataract, Hypertension, Narrow angle glaucoma suspect of both eyes, and PUD (peptic ulcer disease).   She has a past surgical history that includes dilitation and curritage; Colonoscopy (N/A, 10/24/2014); and Esophagogastroduodenoscopy (N/A, 10/24/2014).   Her family history includes Diabetes in her mother; Hypertension in her father and mother.She reports that she has been smoking cigarettes. She has a 25.00  pack-year smoking history. She has never used smokeless tobacco. She reports that she does not drink alcohol. Her drug history is not on file.  Current Outpatient Medications on File Prior to Visit  Medication Sig Dispense Refill  . Aspirin-Acetaminophen-Caffeine (GOODY HEADACHE PO) Take 1 packet by mouth daily as needed for headache.    . Calcium Carbonate-Vitamin D (CALCARB 600/D PO) Take 1 tablet by mouth every evening.    . Cholecalciferol (VITAMIN D) 2000 units tablet Take 1 tablet (2,000 Units total) by mouth daily. 30 tablet 11  . esomeprazole (NEXIUM) 20 MG capsule Take by mouth.    Javier Docker Oil 300 MG CAPS Take 2 capsules (600 mg total) by mouth 2 (two) times daily. 120 capsule 11  . latanoprost (XALATAN) 0.005 % ophthalmic solution 1 drop.    . Multiple Vitamin (MULTIVITAMIN) tablet Take 1 tablet by mouth daily.    . quinapril (ACCUPRIL) 40 MG tablet TAKE ONE TABLET BY MOUTH ONCE DAILY IN THE MORNING 90 tablet 0  . verapamil (CALAN-SR) 240 MG CR tablet Take 1 tablet (240 mg total) by mouth daily. 90 tablet 1  . diphenhydrAMINE (BENADRYL) 25 mg capsule Take 25 mg by mouth every 6 (six) hours as needed for itching or allergies.     No current facility-administered medications on file prior to visit.     ROS Review of Systems  Constitutional: Negative.  HENT: Negative for congestion.   Eyes: Negative for visual disturbance.  Respiratory: Negative for shortness of breath.   Cardiovascular: Negative for chest pain.  Gastrointestinal: Negative for abdominal pain, constipation, diarrhea, nausea and vomiting.  Genitourinary: Negative for difficulty urinating.  Musculoskeletal: Positive for arthralgias (chronic right ankle pain. Mild to moderate. Wears an elastic sleeve for flare ups). Negative for myalgias.  Neurological: Negative for headaches.  Psychiatric/Behavioral: Negative for sleep disturbance.    Objective:  BP 137/73   Pulse 81   Temp (!) 97.4 F (36.3 C) (Oral)   Ht  5' 4.5" (1.638 m)   Wt 148 lb (67.1 kg)   BMI 25.01 kg/m   BP Readings from Last 3 Encounters:  01/04/18 137/73  07/05/17 136/80  04/06/17 112/62    Wt Readings from Last 3 Encounters:  01/04/18 148 lb (67.1 kg)  07/05/17 149 lb 2 oz (67.6 kg)  04/06/17 143 lb (64.9 kg)     Physical Exam  Constitutional: She is oriented to person, place, and time. She appears well-developed and well-nourished. No distress.  HENT:  Head: Normocephalic and atraumatic.  Right Ear: External ear normal.  Left Ear: External ear normal.  Nose: Nose normal.  Mouth/Throat: Oropharynx is clear and moist.  Eyes: Pupils are equal, round, and reactive to light. Conjunctivae and EOM are normal.  Neck: Normal range of motion. Neck supple. No thyromegaly present.  Cardiovascular: Normal rate, regular rhythm and normal heart sounds.  No murmur heard. Pulmonary/Chest: Effort normal and breath sounds normal. No respiratory distress. She has no wheezes. She has no rales.  Abdominal: Soft. Bowel sounds are normal. She exhibits no distension. There is no tenderness.  Musculoskeletal: She exhibits tenderness. She exhibits no edema.  Patient reports right ankle discomfort.  There is some chronic fat deposit over the right lateral malleolus and moderate opening of the joint with inversion maneuver.  Drawer sign negative.  Eversion maneuver normal  Lymphadenopathy:    She has no cervical adenopathy.  Neurological: She is alert and oriented to person, place, and time. She has normal reflexes.  Skin: Skin is warm and dry.  Psychiatric: She has a normal mood and affect. Her behavior is normal. Judgment and thought content normal.      Assessment & Plan:   Eliani was seen today for medical management of chronic issues.  Diagnoses and all orders for this visit:  Gastroesophageal reflux disease, esophagitis presence not specified  Mixed hyperlipidemia  Essential hypertension  Generalized anxiety  disorder  Current every day smoker  Other orders -     ALPRAZolam (XANAX) 0.25 MG tablet; TAKE 1/2 TABLET 3 TIMES A DAY AS NEEDED FOR ANXIETY   Allergies as of 01/04/2018      Reactions   Corn-containing Products Itching   Sulfa Antibiotics Other (See Comments)   Childhood reaction-unknown   Timolol Other (See Comments)   depression   Tape Rash      Medication List        Accurate as of 01/04/18  9:54 AM. Always use your most recent med list.          ALPRAZolam 0.25 MG tablet Commonly known as:  XANAX TAKE 1/2 TABLET 3 TIMES A DAY AS NEEDED FOR ANXIETY   CALCARB 600/D PO Take 1 tablet by mouth every evening.   diphenhydrAMINE 25 mg capsule Commonly known as:  BENADRYL Take 25 mg by mouth every 6 (six) hours as needed for itching or allergies.   esomeprazole 20 MG capsule  Commonly known as:  NEXIUM Take by mouth.   GOODY HEADACHE PO Take 1 packet by mouth daily as needed for headache.   Krill Oil 300 MG Caps Take 2 capsules (600 mg total) by mouth 2 (two) times daily.   latanoprost 0.005 % ophthalmic solution Commonly known as:  XALATAN 1 drop.   multivitamin tablet Take 1 tablet by mouth daily.   quinapril 40 MG tablet Commonly known as:  ACCUPRIL TAKE ONE TABLET BY MOUTH ONCE DAILY IN THE MORNING   verapamil 240 MG CR tablet Commonly known as:  CALAN-SR Take 1 tablet (240 mg total) by mouth daily.   Vitamin D 2000 units tablet Take 1 tablet (2,000 Units total) by mouth daily.       Meds ordered this encounter  Medications  . ALPRAZolam (XANAX) 0.25 MG tablet    Sig: TAKE 1/2 TABLET 3 TIMES A DAY AS NEEDED FOR ANXIETY    Dispense:  45 tablet    Refill:  5    This request is for a new prescription for a controlled substance as required by Federal/State law.    Annual well visit recommended.  Patient will schedule.  Follow-up: Return in about 6 months (around 07/06/2018) for Wellness, hypertension.  Claretta Fraise, M.D.

## 2018-01-05 LAB — CMP14+EGFR
ALBUMIN: 4.5 g/dL (ref 3.5–4.8)
ALT: 28 IU/L (ref 0–32)
AST: 25 IU/L (ref 0–40)
Albumin/Globulin Ratio: 1.6 (ref 1.2–2.2)
Alkaline Phosphatase: 69 IU/L (ref 39–117)
BUN/Creatinine Ratio: 14 (ref 12–28)
BUN: 11 mg/dL (ref 8–27)
Bilirubin Total: <0.2 mg/dL (ref 0.0–1.2)
CALCIUM: 10 mg/dL (ref 8.7–10.3)
CHLORIDE: 103 mmol/L (ref 96–106)
CO2: 21 mmol/L (ref 20–29)
Creatinine, Ser: 0.78 mg/dL (ref 0.57–1.00)
GFR calc non Af Amer: 76 mL/min/{1.73_m2} (ref >59)
GFR, EST AFRICAN AMERICAN: 88 mL/min/{1.73_m2} (ref >59)
GLOBULIN, TOTAL: 2.9 g/dL (ref 1.5–4.5)
Glucose: 104 mg/dL — ABNORMAL HIGH (ref 65–99)
Potassium: 4.7 mmol/L (ref 3.5–5.2)
Sodium: 142 mmol/L (ref 134–144)
TOTAL PROTEIN: 7.4 g/dL (ref 6.0–8.5)

## 2018-01-05 LAB — CBC WITH DIFFERENTIAL/PLATELET
Basophils Absolute: 0.1 10*3/uL (ref 0.0–0.2)
Basos: 2 %
EOS (ABSOLUTE): 0.8 10*3/uL — ABNORMAL HIGH (ref 0.0–0.4)
EOS: 9 %
HEMATOCRIT: 41 % (ref 34.0–46.6)
HEMOGLOBIN: 13.9 g/dL (ref 11.1–15.9)
IMMATURE GRANULOCYTES: 1 %
Immature Grans (Abs): 0.1 10*3/uL (ref 0.0–0.1)
Lymphocytes Absolute: 2.2 10*3/uL (ref 0.7–3.1)
Lymphs: 24 %
MCH: 31.4 pg (ref 26.6–33.0)
MCHC: 33.9 g/dL (ref 31.5–35.7)
MCV: 93 fL (ref 79–97)
MONOCYTES: 7 %
Monocytes Absolute: 0.7 10*3/uL (ref 0.1–0.9)
NEUTROS PCT: 57 %
Neutrophils Absolute: 5.3 10*3/uL (ref 1.4–7.0)
Platelets: 378 10*3/uL (ref 150–450)
RBC: 4.43 x10E6/uL (ref 3.77–5.28)
RDW: 13.1 % (ref 12.3–15.4)
WBC: 9.1 10*3/uL (ref 3.4–10.8)

## 2018-01-05 LAB — LIPID PANEL
CHOLESTEROL TOTAL: 218 mg/dL — AB (ref 100–199)
Chol/HDL Ratio: 5.9 ratio — ABNORMAL HIGH (ref 0.0–4.4)
HDL: 37 mg/dL — ABNORMAL LOW (ref >39)
LDL Calculated: 122 mg/dL — ABNORMAL HIGH (ref 0–99)
Triglycerides: 294 mg/dL — ABNORMAL HIGH (ref 0–149)
VLDL Cholesterol Cal: 59 mg/dL — ABNORMAL HIGH (ref 5–40)

## 2018-01-10 ENCOUNTER — Encounter: Payer: Self-pay | Admitting: *Deleted

## 2018-01-17 DIAGNOSIS — L579 Skin changes due to chronic exposure to nonionizing radiation, unspecified: Secondary | ICD-10-CM | POA: Diagnosis not present

## 2018-01-17 DIAGNOSIS — D485 Neoplasm of uncertain behavior of skin: Secondary | ICD-10-CM | POA: Diagnosis not present

## 2018-01-17 DIAGNOSIS — L821 Other seborrheic keratosis: Secondary | ICD-10-CM | POA: Diagnosis not present

## 2018-01-17 DIAGNOSIS — L814 Other melanin hyperpigmentation: Secondary | ICD-10-CM | POA: Diagnosis not present

## 2018-01-17 DIAGNOSIS — L739 Follicular disorder, unspecified: Secondary | ICD-10-CM | POA: Diagnosis not present

## 2018-01-30 ENCOUNTER — Other Ambulatory Visit: Payer: Self-pay | Admitting: Family Medicine

## 2018-02-14 ENCOUNTER — Encounter: Payer: Self-pay | Admitting: Family Medicine

## 2018-02-14 ENCOUNTER — Ambulatory Visit (INDEPENDENT_AMBULATORY_CARE_PROVIDER_SITE_OTHER): Payer: Medicare Other | Admitting: Family Medicine

## 2018-02-14 VITALS — BP 130/83 | HR 97 | Temp 97.8°F | Ht 64.5 in | Wt 145.0 lb

## 2018-02-14 DIAGNOSIS — R197 Diarrhea, unspecified: Secondary | ICD-10-CM | POA: Diagnosis not present

## 2018-02-14 DIAGNOSIS — Z8719 Personal history of other diseases of the digestive system: Secondary | ICD-10-CM | POA: Diagnosis not present

## 2018-02-14 MED ORDER — AMOXICILLIN-POT CLAVULANATE 875-125 MG PO TABS: 1.0000 | ORAL_TABLET | Freq: Two times a day (BID) | ORAL | 0 refills | Status: AC

## 2018-02-14 MED ORDER — METRONIDAZOLE 500 MG PO TABS: 500.0000 mg | ORAL_TABLET | Freq: Three times a day (TID) | ORAL | 0 refills | Status: AC

## 2018-02-14 NOTE — Progress Notes (Signed)
Subjective: CC: Diarrhea PCP: Claretta Fraise, MD [id number] Lacey Christensen is a 72 y.o. female presenting to clinic today for:  1. Diarrhea Patient reports a 1 day history of diarrhea.  She reports she had a similar episode about 10 days ago but placed herself on a liquid diet for a couple of days and had no recurrence until yesterday.  She thinks that the symptoms started after eating pizza.  She reports scant blood on her toilet paper yesterday and therefore schedule an appointment to be seen today.  She denies any associated abdominal pain, nausea, vomiting, fevers.  She reports a history of ischemic colitis that she was hospitalized for earlier this year.  She was worried that she was having recurrence and therefore presented to the office today.   ROS: Per HPI  Allergies  Allergen Reactions  . Corn-Containing Products Itching  . Sulfa Antibiotics Other (See Comments)    Childhood reaction-unknown  . Timolol Other (See Comments)    depression  . Tape Rash   Past Medical History:  Diagnosis Date  . Cataract   . Hypertension   . Narrow angle glaucoma suspect of both eyes   . PUD (peptic ulcer disease)    12 years ago, EGD at [facility] in [facility]    Current Outpatient Medications:  .  ALPRAZolam (XANAX) 0.25 MG tablet, TAKE 1/2 TABLET 3 TIMES A DAY AS NEEDED FOR ANXIETY, Disp: 45 tablet, Rfl: 5 .  Aspirin-Acetaminophen-Caffeine (GOODY HEADACHE PO), Take 1 packet by mouth daily as needed for headache., Disp: , Rfl:  .  Calcium Carbonate-Vitamin D (CALCARB 600/D PO), Take 1 tablet by mouth every evening., Disp: , Rfl:  .  Cholecalciferol (VITAMIN D) 2000 units tablet, Take 1 tablet (2,000 Units total) by mouth daily., Disp: 30 tablet, Rfl: 11 .  diphenhydrAMINE (BENADRYL) 25 mg capsule, Take 25 mg by mouth every 6 (six) hours as needed for itching or allergies., Disp: , Rfl:  .  esomeprazole (NEXIUM) 20 MG capsule, Take by mouth., Disp: , Rfl:  .  Krill Oil 300 MG  CAPS, Take 2 capsules (600 mg total) by mouth 2 (two) times daily., Disp: 120 capsule, Rfl: 11 .  latanoprost (XALATAN) 0.005 % ophthalmic solution, 1 drop., Disp: , Rfl:  .  Multiple Vitamin (MULTIVITAMIN) tablet, Take 1 tablet by mouth daily., Disp: , Rfl:  .  quinapril (ACCUPRIL) 40 MG tablet, TAKE ONE TABLET BY MOUTH ONCE DAILY IN THE MORNING, Disp: 90 tablet, Rfl: 1 .  verapamil (CALAN-SR) 240 MG CR tablet, Take 1 tablet (240 mg total) by mouth daily., Disp: 90 tablet, Rfl: 1 Social History   Socioeconomic History  . Marital status: Married    Spouse name: Not on file  . Number of children: Not on file  . Years of education: Not on file  . Highest education level: Not on file  Occupational History  . Not on file  Social Needs  . Financial resource strain: Not on file  . Food insecurity:    Worry: Not on file    Inability: Not on file  . Transportation needs:    Medical: Not on file    Non-medical: Not on file  Tobacco Use  . Smoking status: Current Every Day Smoker    Packs/day: 0.50    Years: 50.00    Pack years: 25.00    Types: Cigarettes  . Smokeless tobacco: Never Used  Substance and Sexual Activity  . Alcohol use: No    Alcohol/week: 0.0  standard drinks  . Drug use: Not on file  . Sexual activity: Yes    Birth control/protection: Post-menopausal  Lifestyle  . Physical activity:    Days per week: Not on file    Minutes per session: Not on file  . Stress: Not on file  Relationships  . Social connections:    Talks on phone: Not on file    Gets together: Not on file    Attends religious service: Not on file    Active member of club or organization: Not on file    Attends meetings of clubs or organizations: Not on file    Relationship status: Not on file  . Intimate partner violence:    Fear of current or ex partner: Not on file    Emotionally abused: Not on file    Physically abused: Not on file    Forced sexual activity: Not on file  Other Topics Concern    . Not on file  Social History Narrative  . Not on file   Family History  Problem Relation Age of Onset  . Diabetes Mother   . Hypertension Mother   . Hypertension Father   . Colon cancer Neg Hx     Objective: Office vital signs reviewed. BP 130/83   Pulse 97   Temp 97.8 F (36.6 C) (Oral)   Ht 5' 4.5" (1.638 m)   Wt 145 lb (65.8 kg)   BMI 24.50 kg/m   Physical Examination:  General: Awake, alert, well nourished, well appearing. No acute distress HEENT: normal, sclera white, MMM GI: soft, non-tender, non-distended, bowel sounds present x4, no hepatomegaly, no splenomegaly, no masses  Assessment/ Plan: 72 y.o. female   1. Diarrhea, unspecified type Single episode with scant blood after wiping.  At this point I am not entirely convinced that she is having diverticulitis or any ischemic colitis.  I do question possible GI irritation related to pizza and caffeine vs bleeding hemorrhoid.  I reviewed her colonoscopy result from 2016, which showed hemorrhoids but no diverticula.  She states that she indeed does have hx of diverticula.  Her abdominal exam was totally unremarkable.  She has normal vital signs and is afebrile.  We discussed signs and symptoms that would prompt her to use antibiotics.  I have given her a written prescription for Augmentin and Flagyl to have on hand should the diarrhea persist or she develop any other worrisome symptoms or signs of diverticulitis.  She will contact the office for an appointment to follow-up should she proceed with the medication.  For now, I recommended bland diet, plenty of fluids.  Avoid aggravating foods, particularly greasy foods and foods containing caffeine.  Patient understands reasons for emergent evaluation the emergency department.  2. History of ischemic colitis As above.   No orders of the defined types were placed in this encounter.  Meds ordered this encounter  Medications  . amoxicillin-clavulanate (AUGMENTIN) 875-125 MG  tablet    Sig: Take 1 tablet by mouth 2 (two) times daily for 7 days.    Dispense:  20 tablet    Refill:  0  . metroNIDAZOLE (FLAGYL) 500 MG tablet    Sig: Take 1 tablet (500 mg total) by mouth 3 (three) times daily for 7 days.    Dispense:  21 tablet    Refill:  [place], DO [place] [phone]

## 2018-02-14 NOTE — Patient Instructions (Signed)
As we discussed, at this point I would not proceed with antibiotics.  Your diarrhea most certainly may be related to gastrointestinal irritation due to the foods and drinks that you have been consuming.  I would like you to stick with a bland/liquid diet for the next couple of days.  If you develop any worsening symptoms or any other symptoms that we discussed, proceed with the antibiotics but make sure that you schedule follow-up.   Diverticulitis Diverticulitis is when small pockets in your large intestine (colon) get infected or swollen. This causes stomach pain and watery poop (diarrhea). These pouches are called diverticula. They form in people who have a condition called diverticulosis. Follow these instructions at home: Medicines  Take over-the-counter and prescription medicines only as told by your doctor. These include: ? Antibiotics. ? Pain medicines. ? Fiber pills. ? Probiotics. ? Stool softeners.  Do not drive or use heavy machinery while taking prescription pain medicine.  If you were prescribed an antibiotic, take it as told. Do not stop taking it even if you feel better. General instructions  Follow a diet as told by your doctor.  When you feel better, your doctor may tell you to change your diet. You may need to eat a lot of fiber. Fiber makes it easier to poop (have bowel movements). Healthy foods with fiber include: ? Berries. ? Beans. ? Lentils. ? Green vegetables.  Exercise 3 or more times a week. Aim for 30 minutes each time. Exercise enough to sweat and make your heart beat faster.  Keep all follow-up visits as told. This is important. You may need to have an exam of the large intestine. This is called a colonoscopy. Contact a doctor if:  Your pain does not get better.  You have a hard time eating or drinking.  You are not pooping like normal. Get help right away if:  Your pain gets worse.  Your problems do not get better.  Your problems get worse  very fast.  You have a fever.  You throw up (vomit) more than one time.  You have poop that is: ? Bloody. ? Black. ? Tarry. Summary  Diverticulitis is when small pockets in your large intestine (colon) get infected or swollen.  Take medicines only as told by your doctor.  Follow a diet as told by your doctor. This information is not intended to replace advice given to you by your health care provider. Make sure you discuss any questions you have with your health care provider. Document Released: 09/01/2007 Document Revised: 04/01/2016 Document Reviewed: 04/01/2016 Elsevier Interactive Patient Education  2017 [employer].

## 2018-03-27 ENCOUNTER — Other Ambulatory Visit: Payer: Self-pay | Admitting: Family Medicine

## 2018-06-26 ENCOUNTER — Other Ambulatory Visit: Payer: Self-pay | Admitting: Family Medicine

## 2018-07-07 ENCOUNTER — Ambulatory Visit: Payer: Medicare Other | Admitting: Family Medicine

## 2018-07-11 ENCOUNTER — Ambulatory Visit: Payer: Medicare Other | Admitting: Family Medicine

## 2018-07-11 ENCOUNTER — Other Ambulatory Visit: Payer: Self-pay | Admitting: Family Medicine

## 2018-07-11 ENCOUNTER — Other Ambulatory Visit: Payer: Self-pay

## 2018-07-11 ENCOUNTER — Encounter: Payer: Self-pay | Admitting: Family Medicine

## 2018-07-11 ENCOUNTER — Ambulatory Visit (INDEPENDENT_AMBULATORY_CARE_PROVIDER_SITE_OTHER): Payer: Medicare Other | Admitting: Family Medicine

## 2018-07-11 DIAGNOSIS — I10 Essential (primary) hypertension: Secondary | ICD-10-CM

## 2018-07-11 DIAGNOSIS — F411 Generalized anxiety disorder: Secondary | ICD-10-CM | POA: Diagnosis not present

## 2018-07-11 MED ORDER — ALPRAZOLAM 0.25 MG PO TABS
ORAL_TABLET | ORAL | 5 refills | Status: DC
Start: 2018-07-11 — End: 2019-01-23

## 2018-07-11 MED ORDER — QUINAPRIL HCL 40 MG PO TABS: 40.0000 mg | ORAL_TABLET | Freq: Every day | ORAL | 1 refills | Status: DC

## 2018-07-11 MED ORDER — VERAPAMIL HCL ER 240 MG PO TBCR: 240.0000 mg | EXTENDED_RELEASE_TABLET | Freq: Every day | ORAL | 0 refills | Status: DC

## 2018-07-11 MED ORDER — ESOMEPRAZOLE MAGNESIUM 20 MG PO CPDR: 20.0000 mg | DELAYED_RELEASE_CAPSULE | Freq: Every day | ORAL | 11 refills | Status: DC

## 2018-07-11 NOTE — Progress Notes (Signed)
Subjective:  Patient ID: Lacey Christensen, female    DOB: 1945/10/19  Age: 73 y.o. MRN: [id number]  CC: No chief complaint on file.   HPI Deserae Christensen presents for  follow-up of hypertension. Patient has no history of headache chest pain or shortness of breath or recent cough. Patient also denies symptoms of TIA such as focal numbness or weakness. Patient denies side effects from medication. States taking it regularly. Anxiety well controlled. Using meds as directed. Tends to get nervous, anxious worry a lot. Currently based on COVID.   History Litzi has a past medical history of Cataract, Hypertension, Narrow angle glaucoma suspect of both eyes, and PUD (peptic ulcer disease).   She has a past surgical history that includes dilitation and curritage; Colonoscopy (N/A, 10/24/2014); and Esophagogastroduodenoscopy (N/A, 10/24/2014).   Her family history includes Diabetes in her mother; Hypertension in her father and mother.She reports that she has been smoking cigarettes. She has a 25.00 pack-year smoking history. She has never used smokeless tobacco. She reports that she does not drink alcohol. No history on file for drug.  Current Outpatient Medications on File Prior to Visit  Medication Sig Dispense Refill  . ALPRAZolam (XANAX) 0.25 MG tablet TAKE 1/2 TABLET 3 TIMES A DAY AS NEEDED FOR ANXIETY 45 tablet 5  . Aspirin-Acetaminophen-Caffeine (GOODY HEADACHE PO) Take 1 packet by mouth daily as needed for headache.    . Calcium Carbonate-Vitamin D (CALCARB 600/D PO) Take 1 tablet by mouth every evening.    . Cholecalciferol (VITAMIN D) 2000 units tablet Take 1 tablet (2,000 Units total) by mouth daily. 30 tablet 11  . diphenhydrAMINE (BENADRYL) 25 mg capsule Take 25 mg by mouth every 6 (six) hours as needed for itching or allergies.    Marland Kitchen esomeprazole (NEXIUM) 20 MG capsule Take by mouth.    Javier Docker Oil 300 MG CAPS Take 2 capsules (600 mg total) by mouth 2 (two) times daily. 120 capsule 11  .  latanoprost (XALATAN) 0.005 % ophthalmic solution 1 drop.    . Multiple Vitamin (MULTIVITAMIN) tablet Take 1 tablet by mouth daily.    . quinapril (ACCUPRIL) 40 MG tablet TAKE ONE TABLET BY MOUTH ONCE DAILY IN THE MORNING 90 tablet 1  . verapamil (CALAN-SR) 240 MG CR tablet TAKE 1 TABLET DAILY 90 tablet 0   No current facility-administered medications on file prior to visit.     ROS Review of Systems  Constitutional: Negative.   HENT: Negative.   Eyes: Negative for visual disturbance.  Respiratory: Negative for shortness of breath.   Cardiovascular: Negative for chest pain.  Gastrointestinal: Negative for abdominal pain.  Musculoskeletal: Negative for arthralgias.    Objective:  There were no vitals taken for this visit.  BP Readings from Last 3 Encounters:  02/14/18 130/83  01/04/18 137/73  07/05/17 136/80    Wt Readings from Last 3 Encounters:  02/14/18 145 lb (65.8 kg)  01/04/18 148 lb (67.1 kg)  07/05/17 149 lb 2 oz (67.6 kg)     Physical Exam Constitutional:      General: She is not in acute distress. Neurological:     Mental Status: She is oriented to person, place, and time.  Psychiatric:        Mood and Affect: Mood normal.        Behavior: Behavior normal.        Thought Content: Thought content normal.        Judgment: Judgment normal.     Deferred due to  phone visit format  Assessment & Plan:   There are no diagnoses linked to this encounter. Allergies as of 07/11/2018      Reactions   Corn-containing Products Itching   Sulfa Antibiotics Other (See Comments)   Childhood reaction-unknown   Timolol Other (See Comments)   depression   Tape Rash      Medication List       Accurate as of July 11, 2018  5:37 PM. Always use your most recent med list.        ALPRAZolam 0.25 MG tablet Commonly known as:  XANAX TAKE 1/2 TABLET 3 TIMES A DAY AS NEEDED FOR ANXIETY   CALCARB 600/D PO Take 1 tablet by mouth every evening.   diphenhydrAMINE 25  mg capsule Commonly known as:  BENADRYL Take 25 mg by mouth every 6 (six) hours as needed for itching or allergies.   esomeprazole 20 MG capsule Commonly known as:  NEXIUM Take by mouth.   GOODY HEADACHE PO Take 1 packet by mouth daily as needed for headache.   Krill Oil 300 MG Caps Take 2 capsules (600 mg total) by mouth 2 (two) times daily.   latanoprost 0.005 % ophthalmic solution Commonly known as:  XALATAN 1 drop.   multivitamin tablet Take 1 tablet by mouth daily.   quinapril 40 MG tablet Commonly known as:  ACCUPRIL TAKE ONE TABLET BY MOUTH ONCE DAILY IN THE MORNING   verapamil 240 MG CR tablet Commonly known as:  CALAN-SR TAKE 1 TABLET DAILY   Vitamin D 50 MCG (2000 UT) tablet Take 1 tablet (2,000 Units total) by mouth daily.       No orders of the defined types were placed in this encounter.   Virtual Visit via telephone Note  I discussed the limitations, risks, security and privacy concerns of performing an evaluation and management service by telephone and the availability of in person appointments. I also discussed with the patient that there may be a patient responsible charge related to this service. The patient expressed understanding and agreed to proceed. Pt. Is at home. Dr. Livia Snellen is in his office.  Follow Up Instructions:   I discussed the assessment and treatment plan with the patient. The patient was provided an opportunity to ask questions and all were answered. The patient agreed with the plan and demonstrated an understanding of the instructions.   The patient was advised to call back or seek an in-person evaluation if the symptoms worsen or if the condition fails to improve as anticipated.  Visit started: 9:25 Call ended:  9:35 Total minutes including chart review and phone contact time: 15   Follow-up: No follow-ups on file.  Claretta Fraise, M.D.

## 2018-09-14 DIAGNOSIS — Z79899 Other long term (current) drug therapy: Secondary | ICD-10-CM | POA: Diagnosis not present

## 2018-09-14 DIAGNOSIS — H401121 Primary open-angle glaucoma, left eye, mild stage: Secondary | ICD-10-CM | POA: Diagnosis not present

## 2018-09-14 DIAGNOSIS — H401113 Primary open-angle glaucoma, right eye, severe stage: Secondary | ICD-10-CM | POA: Diagnosis not present

## 2018-09-22 ENCOUNTER — Other Ambulatory Visit: Payer: Self-pay | Admitting: Family Medicine

## 2018-11-27 DIAGNOSIS — H401121 Primary open-angle glaucoma, left eye, mild stage: Secondary | ICD-10-CM | POA: Diagnosis not present

## 2018-11-27 DIAGNOSIS — H401113 Primary open-angle glaucoma, right eye, severe stage: Secondary | ICD-10-CM | POA: Diagnosis not present

## 2018-12-22 ENCOUNTER — Other Ambulatory Visit: Payer: Self-pay | Admitting: Family Medicine

## 2018-12-24 ENCOUNTER — Other Ambulatory Visit: Payer: Self-pay | Admitting: Family Medicine

## 2019-01-22 ENCOUNTER — Other Ambulatory Visit: Payer: Self-pay | Admitting: Family Medicine

## 2019-01-23 ENCOUNTER — Ambulatory Visit (INDEPENDENT_AMBULATORY_CARE_PROVIDER_SITE_OTHER): Payer: Medicare Other | Admitting: Family Medicine

## 2019-01-23 ENCOUNTER — Encounter: Payer: Self-pay | Admitting: Family Medicine

## 2019-01-23 DIAGNOSIS — K219 Gastro-esophageal reflux disease without esophagitis: Secondary | ICD-10-CM | POA: Diagnosis not present

## 2019-01-23 DIAGNOSIS — I10 Essential (primary) hypertension: Secondary | ICD-10-CM | POA: Diagnosis not present

## 2019-01-23 DIAGNOSIS — F411 Generalized anxiety disorder: Secondary | ICD-10-CM | POA: Diagnosis not present

## 2019-01-23 MED ORDER — VERAPAMIL HCL ER 240 MG PO TBCR: 240.0000 mg | EXTENDED_RELEASE_TABLET | Freq: Every day | ORAL | 0 refills | Status: DC

## 2019-01-23 MED ORDER — ALPRAZOLAM 0.25 MG PO TABS: ORAL_TABLET | ORAL | 5 refills | Status: DC

## 2019-01-23 MED ORDER — QUINAPRIL HCL 40 MG PO TABS: 40.0000 mg | ORAL_TABLET | Freq: Every day | ORAL | 0 refills | Status: DC

## 2019-01-23 MED ORDER — ESOMEPRAZOLE MAGNESIUM 20 MG PO CPDR: 20.0000 mg | DELAYED_RELEASE_CAPSULE | Freq: Every day | ORAL | 11 refills | Status: DC

## 2019-01-23 NOTE — Progress Notes (Signed)
Subjective:  Patient ID: Lacey Christensen, female    DOB: 02-Mar-1946  Age: 73 y.o. MRN: [id number]  CC: No chief complaint on file.   HPI Lacey Christensen presents for  follow-up of hypertension. Patient has no history of headache chest pain or shortness of breath or recent cough. Patient also denies symptoms of TIA such as focal numbness or weakness. Patient denies side effects from medication. States taking it regularly. Patient in for follow-up of GERD. Currently asymptomatic taking  PPI daily. There is no chest pain or heartburn. No hematemesis and no melena. No dysphagia or choking. Onset is remote. Progression is stable. Complicating factors, none. PT. Continues to take xanax for nerves taking 1/2 tablet at a time with good result. Only problem is getting refills. Frequently is out for 2-3 days before pharmacy gets her refill ready. Se does feel anxious when out of the med. Otherwise she has been staying home for several months due to covid and having some cabin fever. Managing it pretty well. Not overly anxious, but dose rely on her medication.   History Lacey Christensen has a past medical history of Cataract, Hypertension, Narrow angle glaucoma suspect of both eyes, and PUD (peptic ulcer disease).   She has a past surgical history that includes dilitation and curritage; Colonoscopy (N/A, 10/24/2014); and Esophagogastroduodenoscopy (N/A, 10/24/2014).   Her family history includes Diabetes in her mother; Hypertension in her father and mother.She reports that she has been smoking cigarettes. She has a 25.00 pack-year smoking history. She has never used smokeless tobacco. She reports that she does not drink alcohol. No history on file for drug.  Current Outpatient Medications on File Prior to Visit  Medication Sig Dispense Refill  . Aspirin-Acetaminophen-Caffeine (GOODY HEADACHE PO) Take 1 packet by mouth daily as needed for headache.    . Calcium Carbonate-Vitamin D (CALCARB 600/D PO) Take 1 tablet by mouth  every evening.    . Cholecalciferol (VITAMIN D) 2000 units tablet Take 1 tablet (2,000 Units total) by mouth daily. 30 tablet 11  . diphenhydrAMINE (BENADRYL) 25 mg capsule Take 25 mg by mouth every 6 (six) hours as needed for itching or allergies.    Javier Docker Oil 300 MG CAPS Take 2 capsules (600 mg total) by mouth 2 (two) times daily. 120 capsule 11  . latanoprost (XALATAN) 0.005 % ophthalmic solution 1 drop.    . Multiple Vitamin (MULTIVITAMIN) tablet Take 1 tablet by mouth daily.     No current facility-administered medications on file prior to visit.     ROS Review of Systems  Constitutional: Negative.   HENT: Negative for congestion.   Eyes: Negative for visual disturbance.  Respiratory: Negative for shortness of breath.   Cardiovascular: Negative for chest pain.  Gastrointestinal: Negative for abdominal pain, constipation, diarrhea, nausea and vomiting.  Genitourinary: Negative for difficulty urinating.  Musculoskeletal: Negative for arthralgias and myalgias.  Neurological: Negative for headaches.  Psychiatric/Behavioral: Negative for sleep disturbance.    Objective:  There were no vitals taken for this visit.  BP Readings from Last 3 Encounters:  02/14/18 130/83  01/04/18 137/73  07/05/17 136/80    Wt Readings from Last 3 Encounters:  02/14/18 145 lb (65.8 kg)  01/04/18 148 lb (67.1 kg)  07/05/17 149 lb 2 oz (67.6 kg)     Physical Exam Exam deferred. Pt. Harboring due to COVID 19. Phone visit performed.    Assessment & Plan:   Diagnoses and all orders for this visit:  Essential hypertension  Generalized anxiety disorder  Gastroesophageal reflux disease, unspecified whether esophagitis present  Other orders -     ALPRAZolam (XANAX) 0.25 MG tablet; TAKE 1/2 TABLET 3 TIMES A DAY AS NEEDED FOR ANXIETY -     esomeprazole (NEXIUM) 20 MG capsule; Take 1 capsule (20 mg total) by mouth daily. -     quinapril (ACCUPRIL) 40 MG tablet; Take 1 tablet (40 mg total)  by mouth daily. -     verapamil (CALAN-SR) 240 MG CR tablet; Take 1 tablet (240 mg total) by mouth daily.   Allergies as of 01/23/2019      Reactions   Corn-containing Products Itching   Sulfa Antibiotics Other (See Comments)   Childhood reaction-unknown   Timolol Other (See Comments)   depression   Tape Rash      Medication List       Accurate as of January 23, 2019  9:32 AM. If you have any questions, ask your nurse or doctor.        ALPRAZolam 0.25 MG tablet Commonly known as: XANAX TAKE 1/2 TABLET 3 TIMES A DAY AS NEEDED FOR ANXIETY   CALCARB 600/D PO Take 1 tablet by mouth every evening.   diphenhydrAMINE 25 mg capsule Commonly known as: BENADRYL Take 25 mg by mouth every 6 (six) hours as needed for itching or allergies.   esomeprazole 20 MG capsule Commonly known as: NEXIUM Take 1 capsule (20 mg total) by mouth daily.   GOODY HEADACHE PO Take 1 packet by mouth daily as needed for headache.   Krill Oil 300 MG Caps Take 2 capsules (600 mg total) by mouth 2 (two) times daily.   latanoprost 0.005 % ophthalmic solution Commonly known as: XALATAN 1 drop.   multivitamin tablet Take 1 tablet by mouth daily.   quinapril 40 MG tablet Commonly known as: ACCUPRIL Take 1 tablet (40 mg total) by mouth daily.   verapamil 240 MG CR tablet Commonly known as: CALAN-SR Take 1 tablet (240 mg total) by mouth daily.   Vitamin D 50 MCG (2000 UT) tablet Take 1 tablet (2,000 Units total) by mouth daily.       Meds ordered this encounter  Medications  . ALPRAZolam (XANAX) 0.25 MG tablet    Sig: TAKE 1/2 TABLET 3 TIMES A DAY AS NEEDED FOR ANXIETY    Dispense:  45 tablet    Refill:  5    This request is for a new prescription for a controlled substance as required by Federal/State law.  . esomeprazole (NEXIUM) 20 MG capsule    Sig: Take 1 capsule (20 mg total) by mouth daily.    Dispense:  30 capsule    Refill:  11  . quinapril (ACCUPRIL) 40 MG tablet    Sig: Take  1 tablet (40 mg total) by mouth daily.    Dispense:  90 tablet    Refill:  0  . verapamil (CALAN-SR) 240 MG CR tablet    Sig: Take 1 tablet (240 mg total) by mouth daily.    Dispense:  90 tablet    Refill:  0    Virtual Visit via telephone Note  I discussed the limitations, risks, security and privacy concerns of performing an evaluation and management service by telephone and the availability of in person appointments. I also discussed with the patient that there may be a patient responsible charge related to this service. The patient expressed understanding and agreed to proceed. Pt. Is at home. Dr. Livia Snellen is in his office.  Follow Up Instructions:  I discussed the assessment and treatment plan with the patient. The patient was provided an opportunity to ask questions and all were answered. The patient agreed with the plan and demonstrated an understanding of the instructions.   The patient was advised to call back or seek an in-person evaluation if the symptoms worsen or if the condition fails to improve as anticipated.  Total minutes including chart review and phone contact time: 25   Follow-up: Return in about 6 months (around 07/24/2019) for Wellness.  Claretta Fraise, M.D.

## 2019-02-06 DIAGNOSIS — L814 Other melanin hyperpigmentation: Secondary | ICD-10-CM | POA: Diagnosis not present

## 2019-02-06 DIAGNOSIS — L579 Skin changes due to chronic exposure to nonionizing radiation, unspecified: Secondary | ICD-10-CM | POA: Diagnosis not present

## 2019-02-06 DIAGNOSIS — L570 Actinic keratosis: Secondary | ICD-10-CM | POA: Diagnosis not present

## 2019-02-06 DIAGNOSIS — L821 Other seborrheic keratosis: Secondary | ICD-10-CM | POA: Diagnosis not present

## 2019-02-06 DIAGNOSIS — D171 Benign lipomatous neoplasm of skin and subcutaneous tissue of trunk: Secondary | ICD-10-CM | POA: Diagnosis not present

## 2019-02-21 ENCOUNTER — Other Ambulatory Visit: Payer: Self-pay

## 2019-04-02 DIAGNOSIS — H401113 Primary open-angle glaucoma, right eye, severe stage: Secondary | ICD-10-CM | POA: Diagnosis not present

## 2019-04-02 DIAGNOSIS — H401121 Primary open-angle glaucoma, left eye, mild stage: Secondary | ICD-10-CM | POA: Diagnosis not present

## 2019-05-07 ENCOUNTER — Other Ambulatory Visit: Payer: Self-pay

## 2019-05-08 ENCOUNTER — Ambulatory Visit (INDEPENDENT_AMBULATORY_CARE_PROVIDER_SITE_OTHER): Payer: Medicare Other | Admitting: Family Medicine

## 2019-05-08 ENCOUNTER — Other Ambulatory Visit: Payer: Self-pay

## 2019-05-08 ENCOUNTER — Encounter: Payer: Self-pay | Admitting: Family Medicine

## 2019-05-08 VITALS — BP 133/85 | HR 82 | Temp 98.0°F | Ht 64.5 in | Wt 147.8 lb

## 2019-05-08 DIAGNOSIS — E782 Mixed hyperlipidemia: Secondary | ICD-10-CM | POA: Diagnosis not present

## 2019-05-08 DIAGNOSIS — E559 Vitamin D deficiency, unspecified: Secondary | ICD-10-CM | POA: Diagnosis not present

## 2019-05-08 DIAGNOSIS — M810 Age-related osteoporosis without current pathological fracture: Secondary | ICD-10-CM

## 2019-05-08 DIAGNOSIS — K219 Gastro-esophageal reflux disease without esophagitis: Secondary | ICD-10-CM | POA: Diagnosis not present

## 2019-05-08 DIAGNOSIS — I10 Essential (primary) hypertension: Secondary | ICD-10-CM | POA: Diagnosis not present

## 2019-05-08 DIAGNOSIS — F411 Generalized anxiety disorder: Secondary | ICD-10-CM

## 2019-05-08 MED ORDER — RISEDRONATE SODIUM 150 MG PO TABS: 150.0000 mg | ORAL_TABLET | ORAL | 3 refills | Status: DC

## 2019-05-08 MED ORDER — QUINAPRIL HCL 40 MG PO TABS: 40.0000 mg | ORAL_TABLET | Freq: Every day | ORAL | 3 refills | Status: DC

## 2019-05-08 MED ORDER — ALPRAZOLAM 0.25 MG PO TABS: ORAL_TABLET | ORAL | 1 refills | Status: DC

## 2019-05-08 MED ORDER — VERAPAMIL HCL ER 240 MG PO TBCR: 240.0000 mg | EXTENDED_RELEASE_TABLET | Freq: Every day | ORAL | Status: DC

## 2019-05-08 NOTE — Progress Notes (Signed)
Subjective:  Patient ID: Lacey Christensen, female    DOB: 12/09/1945  Age: 74 y.o. MRN: [id number]  CC: Follow-up   HPI Lacey Christensen presents for  follow-up of hypertension. Patient has no history of headache chest pain or shortness of breath or recent cough. Patient also denies symptoms of TIA such as focal numbness or weakness. Patient denies side effects from medication. States taking it regularly. Readings at home usually 130s/70-80  She has been taking medication for anxiety for several years. She generally limits herself to 1/2 tab TID.   Patient in for follow-up of GERD. Currently asymptomatic taking  PPI daily. There is no chest pain or heartburn. No hematemesis and no melena. No dysphagia or choking. Onset is remote. Progression is stable. Complicating factors, none.  Pt. Had been diagnosed with osteoporosis in the past. Not taking any med for this other than Vit D and Calcium. She is seeing GYN tomorrow and will discuss with them.      History Lacey Christensen has a past medical history of Cataract, Hypertension, Narrow angle glaucoma suspect of both eyes, and PUD (peptic ulcer disease).   She has a past surgical history that includes dilitation and curritage; Colonoscopy (N/A, 10/24/2014); and Esophagogastroduodenoscopy (N/A, 10/24/2014).   Her family history includes Diabetes in her mother; Hypertension in her father and mother.She reports that she has been smoking cigarettes. She has a 25.00 pack-year smoking history. She has never used smokeless tobacco. She reports that she does not drink alcohol. No history on file for drug.  Current Outpatient Medications on File Prior to Visit  Medication Sig Dispense Refill  . Aspirin-Acetaminophen-Caffeine (GOODY HEADACHE PO) Take 1 packet by mouth daily as needed for headache.    . Calcium Carbonate-Vitamin D (CALCARB 600/D PO) Take 1 tablet by mouth every evening.    . Cholecalciferol (VITAMIN D) 2000 units tablet Take 1 tablet (2,000 Units total)  by mouth daily. 30 tablet 11  . diphenhydrAMINE (BENADRYL) 25 mg capsule Take 25 mg by mouth every 6 (six) hours as needed for itching or allergies.    Marland Kitchen esomeprazole (NEXIUM) 20 MG capsule Take 1 capsule (20 mg total) by mouth daily. 30 capsule 11  . Krill Oil 300 MG CAPS Take 2 capsules (600 mg total) by mouth 2 (two) times daily. 120 capsule 11  . latanoprost (XALATAN) 0.005 % ophthalmic solution 1 drop.    . Multiple Vitamin (MULTIVITAMIN) tablet Take 1 tablet by mouth daily.     No current facility-administered medications on file prior to visit.    ROS Review of Systems  Constitutional: Negative.   HENT: Negative for congestion.   Eyes: Negative for visual disturbance.  Respiratory: Negative for shortness of breath.   Cardiovascular: Negative for chest pain.  Gastrointestinal: Negative for abdominal pain, constipation, diarrhea, nausea and vomiting.  Genitourinary: Negative for difficulty urinating.  Musculoskeletal: Negative for arthralgias and myalgias.  Neurological: Negative for headaches.  Psychiatric/Behavioral: Negative for sleep disturbance.    Objective:  BP 133/85   Pulse 82   Temp 98 F (36.7 C) (Temporal)   Ht 5' 4.5" (1.638 m)   Wt 147 lb 12.8 oz (67 kg)   BMI 24.98 kg/m   BP Readings from Last 3 Encounters:  05/08/19 133/85  02/14/18 130/83  01/04/18 137/73    Wt Readings from Last 3 Encounters:  05/08/19 147 lb 12.8 oz (67 kg)  02/14/18 145 lb (65.8 kg)  01/04/18 148 lb (67.1 kg)     Physical Exam Constitutional:  General: She is not in acute distress.    Appearance: She is well-developed.  HENT:     Head: Normocephalic and atraumatic.  Eyes:     Conjunctiva/sclera: Conjunctivae normal.     Pupils: Pupils are equal, round, and reactive to light.  Neck:     Thyroid: No thyromegaly.  Cardiovascular:     Rate and Rhythm: Normal rate and regular rhythm.     Heart sounds: Normal heart sounds. No murmur.  Pulmonary:     Effort:  Pulmonary effort is normal. No respiratory distress.     Breath sounds: Normal breath sounds. No wheezing or rales.  Abdominal:     General: Bowel sounds are normal. There is no distension.     Palpations: Abdomen is soft.     Tenderness: There is no abdominal tenderness.  Musculoskeletal:        General: Normal range of motion.     Cervical back: Normal range of motion and neck supple.  Lymphadenopathy:     Cervical: No cervical adenopathy.  Skin:    General: Skin is warm and dry.  Neurological:     Mental Status: She is alert and oriented to person, place, and time.  Psychiatric:        Behavior: Behavior normal.        Thought Content: Thought content normal.        Judgment: Judgment normal.       Assessment & Plan:   Lacey Christensen was seen today for follow-up.  Diagnoses and all orders for this visit:  Gastroesophageal reflux disease, unspecified whether esophagitis present -     CBC with Differential/Platelet -     CMP14+EGFR  Mixed hyperlipidemia -     CBC with Differential/Platelet -     CMP14+EGFR -     Lipid panel  Essential hypertension -     verapamil (CALAN-SR) 240 MG CR tablet; Take 1 tablet (240 mg total) by mouth daily. -     quinapril (ACCUPRIL) 40 MG tablet; Take 1 tablet (40 mg total) by mouth daily. -     CBC with Differential/Platelet -     CMP14+EGFR  Generalized anxiety disorder -     ALPRAZolam (XANAX) 0.25 MG tablet; TAKE 1/2 TABLET 3 TIMES A DAY AS NEEDED FOR ANXIETY -     CBC with Differential/Platelet -     CMP14+EGFR  Vitamin D deficiency -     CBC with Differential/Platelet -     CMP14+EGFR -     VITAMIN D 25 Hydroxy (Vit-D Deficiency, Fractures)  Age related osteoporosis, unspecified pathological fracture presence -     risedronate (ACTONEL) 150 MG tablet; Take 1 tablet (150 mg total) by mouth every 30 (thirty) days. with water, on an empty stomach, take nothing by mouth and to not lie down for 30 minutes after each dose.   Allergies  as of 05/08/2019      Reactions   Corn-containing Products Itching   Sulfa Antibiotics Other (See Comments)   Childhood reaction-unknown   Timolol Other (See Comments)   depression   Tape Rash      Medication Christensen       Accurate as of May 08, 2019 10:08 PM. If you have any questions, ask your nurse or doctor.        ALPRAZolam 0.25 MG tablet Commonly known as: XANAX TAKE 1/2 TABLET 3 TIMES A DAY AS NEEDED FOR ANXIETY   CALCARB 600/D PO Take 1 tablet by mouth every evening.  diphenhydrAMINE 25 mg capsule Commonly known as: BENADRYL Take 25 mg by mouth every 6 (six) hours as needed for itching or allergies.   esomeprazole 20 MG capsule Commonly known as: NEXIUM Take 1 capsule (20 mg total) by mouth daily.   GOODY HEADACHE PO Take 1 packet by mouth daily as needed for headache.   Krill Oil 300 MG Caps Take 2 capsules (600 mg total) by mouth 2 (two) times daily.   latanoprost 0.005 % ophthalmic solution Commonly known as: XALATAN 1 drop.   multivitamin tablet Take 1 tablet by mouth daily.   quinapril 40 MG tablet Commonly known as: ACCUPRIL Take 1 tablet (40 mg total) by mouth daily.   risedronate 150 MG tablet Commonly known as: Actonel Take 1 tablet (150 mg total) by mouth every 30 (thirty) days. with water, on an empty stomach, take nothing by mouth and to not lie down for 30 minutes after each dose. Started by: Claretta Fraise, MD   verapamil 240 MG CR tablet Commonly known as: CALAN-SR Take 1 tablet (240 mg total) by mouth daily.   Vitamin D 50 MCG (2000 UT) tablet Take 1 tablet (2,000 Units total) by mouth daily.       Meds ordered this encounter  Medications  . verapamil (CALAN-SR) 240 MG CR tablet    Sig: Take 1 tablet (240 mg total) by mouth daily.    Dispense:  90 tablet    Refill:  03  . quinapril (ACCUPRIL) 40 MG tablet    Sig: Take 1 tablet (40 mg total) by mouth daily.    Dispense:  90 tablet    Refill:  3  . ALPRAZolam (XANAX) 0.25  MG tablet    Sig: TAKE 1/2 TABLET 3 TIMES A DAY AS NEEDED FOR ANXIETY    Dispense:  135 tablet    Refill:  1    This request is for a new prescription for a controlled substance as required by Federal/State law.  . risedronate (ACTONEL) 150 MG tablet    Sig: Take 1 tablet (150 mg total) by mouth every 30 (thirty) days. with water, on an empty stomach, take nothing by mouth and to not lie down for 30 minutes after each dose.    Dispense:  3 tablet    Refill:  3    Please place on file. Pt. Will call when she is ready to start taking this. Thanks, WS    Medical conditions mentioned above.  Each appears to be stable.  She is due for follow-up with her OB/GYN.  She will discuss the use of Actonel with GYN.  Meanwhile continue other medicines as is and have her follow-up in 6 months she should have a DEXA scan soon.  She is also due for her mammogram.  Follow-up: Return in about 6 months (around 11/05/2019), or if symptoms worsen or fail to improve.  Claretta Fraise, M.D.

## 2019-05-09 DIAGNOSIS — H4050X0 Glaucoma secondary to other eye disorders, unspecified eye, stage unspecified: Secondary | ICD-10-CM | POA: Diagnosis not present

## 2019-05-09 DIAGNOSIS — K469 Unspecified abdominal hernia without obstruction or gangrene: Secondary | ICD-10-CM | POA: Insufficient documentation

## 2019-05-09 DIAGNOSIS — N816 Rectocele: Secondary | ICD-10-CM | POA: Diagnosis not present

## 2019-05-09 DIAGNOSIS — N950 Postmenopausal bleeding: Secondary | ICD-10-CM | POA: Insufficient documentation

## 2019-05-09 DIAGNOSIS — N8111 Cystocele, midline: Secondary | ICD-10-CM | POA: Insufficient documentation

## 2019-05-09 DIAGNOSIS — N814 Uterovaginal prolapse, unspecified: Secondary | ICD-10-CM | POA: Diagnosis not present

## 2019-05-09 LAB — LIPID PANEL
Chol/HDL Ratio: 5.5 ratio — ABNORMAL HIGH (ref 0.0–4.4)
Cholesterol, Total: 224 mg/dL — ABNORMAL HIGH (ref 100–199)
HDL: 41 mg/dL (ref >39)
LDL Chol Calc (NIH): 133 mg/dL — ABNORMAL HIGH (ref 0–99)
Triglycerides: 282 mg/dL — ABNORMAL HIGH (ref 0–149)
VLDL Cholesterol Cal: 50 mg/dL — ABNORMAL HIGH (ref 5–40)

## 2019-05-09 LAB — CBC WITH DIFFERENTIAL/PLATELET
Basophils Absolute: 0.1 10*3/uL (ref 0.0–0.2)
Basos: 1 %
EOS (ABSOLUTE): 0.4 10*3/uL (ref 0.0–0.4)
Eos: 4 %
Hematocrit: 41.9 % (ref 34.0–46.6)
Hemoglobin: 14.4 g/dL (ref 11.1–15.9)
Immature Grans (Abs): 0 10*3/uL (ref 0.0–0.1)
Immature Granulocytes: 0 %
Lymphocytes Absolute: 2.7 10*3/uL (ref 0.7–3.1)
Lymphs: 26 %
MCH: 31.6 pg (ref 26.6–33.0)
MCHC: 34.4 g/dL (ref 31.5–35.7)
MCV: 92 fL (ref 79–97)
Monocytes Absolute: 0.7 10*3/uL (ref 0.1–0.9)
Monocytes: 7 %
Neutrophils Absolute: 6.1 10*3/uL (ref 1.4–7.0)
Neutrophils: 62 %
Platelets: 378 10*3/uL (ref 150–450)
RBC: 4.55 x10E6/uL (ref 3.77–5.28)
RDW: 13 % (ref 11.7–15.4)
WBC: 10 10*3/uL (ref 3.4–10.8)

## 2019-05-09 LAB — CMP14+EGFR
ALT: 23 IU/L (ref 0–32)
AST: 22 IU/L (ref 0–40)
Albumin/Globulin Ratio: 1.5 (ref 1.2–2.2)
Albumin: 4.5 g/dL (ref 3.7–4.7)
Alkaline Phosphatase: 74 IU/L (ref 39–117)
BUN/Creatinine Ratio: 13 (ref 12–28)
BUN: 11 mg/dL (ref 8–27)
Bilirubin Total: 0.4 mg/dL (ref 0.0–1.2)
CO2: 20 mmol/L (ref 20–29)
Calcium: 10.8 mg/dL — ABNORMAL HIGH (ref 8.7–10.3)
Chloride: 105 mmol/L (ref 96–106)
Creatinine, Ser: 0.87 mg/dL (ref 0.57–1.00)
GFR calc Af Amer: 76 mL/min/{1.73_m2} (ref >59)
GFR calc non Af Amer: 66 mL/min/{1.73_m2} (ref >59)
Globulin, Total: 3.1 g/dL (ref 1.5–4.5)
Glucose: 105 mg/dL — ABNORMAL HIGH (ref 65–99)
Potassium: 5.4 mmol/L — ABNORMAL HIGH (ref 3.5–5.2)
Sodium: 144 mmol/L (ref 134–144)
Total Protein: 7.6 g/dL (ref 6.0–8.5)

## 2019-05-09 LAB — VITAMIN D 25 HYDROXY (VIT D DEFICIENCY, FRACTURES): Vit D, 25-Hydroxy: 34.2 ng/mL (ref 30.0–100.0)

## 2019-05-22 DIAGNOSIS — N814 Uterovaginal prolapse, unspecified: Secondary | ICD-10-CM | POA: Diagnosis not present

## 2019-05-22 DIAGNOSIS — N816 Rectocele: Secondary | ICD-10-CM | POA: Diagnosis not present

## 2019-05-22 DIAGNOSIS — K469 Unspecified abdominal hernia without obstruction or gangrene: Secondary | ICD-10-CM | POA: Diagnosis not present

## 2019-05-22 DIAGNOSIS — N8111 Cystocele, midline: Secondary | ICD-10-CM | POA: Diagnosis not present

## 2019-05-24 DIAGNOSIS — N8110 Cystocele, unspecified: Secondary | ICD-10-CM | POA: Diagnosis not present

## 2019-05-24 DIAGNOSIS — N950 Postmenopausal bleeding: Secondary | ICD-10-CM | POA: Diagnosis not present

## 2019-05-24 DIAGNOSIS — N814 Uterovaginal prolapse, unspecified: Secondary | ICD-10-CM | POA: Diagnosis not present

## 2019-05-28 DIAGNOSIS — K469 Unspecified abdominal hernia without obstruction or gangrene: Secondary | ICD-10-CM | POA: Diagnosis not present

## 2019-05-28 DIAGNOSIS — N814 Uterovaginal prolapse, unspecified: Secondary | ICD-10-CM | POA: Diagnosis not present

## 2019-05-28 DIAGNOSIS — N950 Postmenopausal bleeding: Secondary | ICD-10-CM | POA: Diagnosis not present

## 2019-05-28 DIAGNOSIS — N8111 Cystocele, midline: Secondary | ICD-10-CM | POA: Diagnosis not present

## 2019-05-28 DIAGNOSIS — N816 Rectocele: Secondary | ICD-10-CM | POA: Diagnosis not present

## 2019-06-20 ENCOUNTER — Other Ambulatory Visit: Payer: Self-pay | Admitting: Family Medicine

## 2019-06-20 ENCOUNTER — Telehealth: Payer: Self-pay | Admitting: Family Medicine

## 2019-06-20 MED ORDER — CIPROFLOXACIN HCL 500 MG PO TABS: 500.0000 mg | ORAL_TABLET | Freq: Two times a day (BID) | ORAL | 0 refills | Status: DC

## 2019-06-20 MED ORDER — METRONIDAZOLE 500 MG PO TABS: 500.0000 mg | ORAL_TABLET | Freq: Two times a day (BID) | ORAL | 0 refills | Status: DC

## 2019-06-20 NOTE — Telephone Encounter (Signed)
Aware, medicines sent in.

## 2019-06-20 NOTE — Telephone Encounter (Signed)
Please review and advise.

## 2019-06-20 NOTE — Telephone Encounter (Signed)
  Incoming Patient Call  06/20/2019  What symptoms do you have? Diarrhea, abdominal pain and back pain, had a little bit of blood in stool  How long have you been sick? Started this morning  Have you been seen for this problem? She has in the past by Dr. Livia Snellen and was told it was Ischemic colitis, and if she had these symptoms to call him and he would prescribe her something  If your provider decides to give you a prescription, which pharmacy would you like for it to be sent to? [facility]   Patient informed that this information will be sent to the clinical staff for review and that they should receive a follow up call.

## 2019-06-20 NOTE — Telephone Encounter (Signed)
Please contact the patient I sent in the antibiotic that she used before, Cipro and Flagyl

## 2019-06-22 ENCOUNTER — Telehealth: Payer: Self-pay | Admitting: Family Medicine

## 2019-06-22 NOTE — Chronic Care Management (AMB) (Signed)
  Chronic Care Management   Note  06/22/2019 Name: Lacey Christensen MRN: [id number] DOB: May 13, 1945  Lacey Christensen is a 74 y.o. year old female who is a primary care patient of Stacks, Cletus Gash, MD. I reached out to Lacey Christensen by phone today in response to a referral sent by Lacey Christensen's health plan.     Lacey Christensen was given information about Chronic Care Management services today including:  1. CCM service includes personalized support from designated clinical staff supervised by her physician, including individualized plan of care and coordination with other care providers 2. 24/7 contact phone numbers for assistance for urgent and routine care needs. 3. Service will only be billed when office clinical staff spend 20 minutes or more in a month to coordinate care. 4. Only one practitioner may furnish and bill the service in a calendar month. 5. The patient may stop CCM services at any time (effective at the end of the month) by phone call to the office staff. 6. The patient will be responsible for cost sharing (co-pay) of up to 20% of the service fee (after annual deductible is met).  Patient agreed to services and verbal consent obtained.   Follow up plan: Telephone appointment with care management team member scheduled for:01/23/2020.  [place], [place] [address] Direct Dial: [phone] [email or web address]

## 2019-06-22 NOTE — Telephone Encounter (Signed)
Pt called stating that Dr Livia Snellen recently prescribed her a Rx for Flagyl. Says the Rx has helped with rectal bleeding but has noticed since taking it, but says her stomach starting hurting today and on the bottle it says if you have abdominal pain, you need to contact your doctor.

## 2019-07-18 DIAGNOSIS — Z23 Encounter for immunization: Secondary | ICD-10-CM | POA: Diagnosis not present

## 2019-07-30 ENCOUNTER — Telehealth: Payer: Self-pay | Admitting: Family Medicine

## 2019-07-30 ENCOUNTER — Other Ambulatory Visit: Payer: Self-pay | Admitting: Family Medicine

## 2019-07-30 DIAGNOSIS — R928 Other abnormal and inconclusive findings on diagnostic imaging of breast: Secondary | ICD-10-CM

## 2019-07-30 NOTE — Telephone Encounter (Signed)
°  REFERRAL REQUEST Telephone Note 07/30/2019  What type of referral do you need? Referral for diagnostic mammogram   Have you been seen at our office for this problem? Yes (Advise that they may need an appointment with their PCP before a referral can be done)  Is there a particular doctor or location that you prefer? [facility]  Patient notified that referrals can take up to a week or longer to process. If they haven't heard anything within a week they should call back and speak with the referral department.

## 2019-08-01 ENCOUNTER — Other Ambulatory Visit: Payer: Self-pay

## 2019-08-01 DIAGNOSIS — R928 Other abnormal and inconclusive findings on diagnostic imaging of breast: Secondary | ICD-10-CM

## 2019-08-06 DIAGNOSIS — H401113 Primary open-angle glaucoma, right eye, severe stage: Secondary | ICD-10-CM | POA: Diagnosis not present

## 2019-08-06 DIAGNOSIS — H401121 Primary open-angle glaucoma, left eye, mild stage: Secondary | ICD-10-CM | POA: Diagnosis not present

## 2019-08-15 DIAGNOSIS — Z23 Encounter for immunization: Secondary | ICD-10-CM | POA: Diagnosis not present

## 2019-08-26 IMAGING — CT CT ABD-PELV W/ CM
2 of 6 series · 16 of 46 positions shown, 18 images · IV contrast (Isovue)
Comparison: 09/23/2014

CLINICAL DATA: Urinary frequency, diarrhea since last night with
bright red blood today, suspected diverticulitis, essential
hypertension, history peptic ulcer disease and hemorrhoids

EXAM:
CT ABDOMEN AND PELVIS WITH CONTRAST
TECHNIQUE: Multidetector CT imaging of the abdomen and pelvis was performed
using the standard protocol following bolus administration of
intravenous contrast. Sagittal and coronal MPR images reconstructed
from axial data set.
CONTRAST:  100mL YQBPRO-IEE IOPAMIDOL (YQBPRO-IEE) INJECTION 61% IV.
No oral contrast.

[Series 2: axial st · axial · 0.59mm/px · z∈[+958,+1343]mm · 13 of 87 slices shown, 15 images]
[im 5/87  soft-tissue]
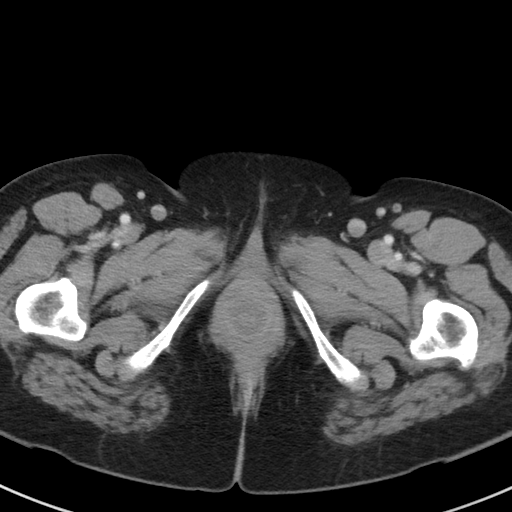
[im 5/87  bone]
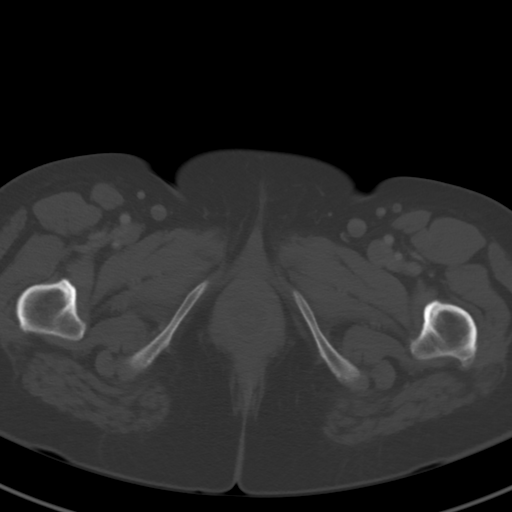
[im 10/87  soft-tissue]
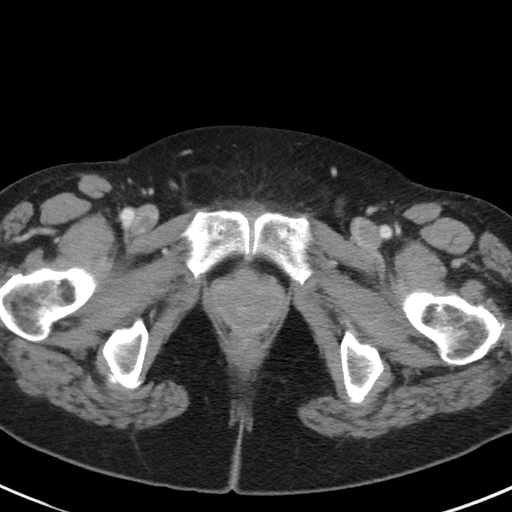
[im 20/87  soft-tissue]
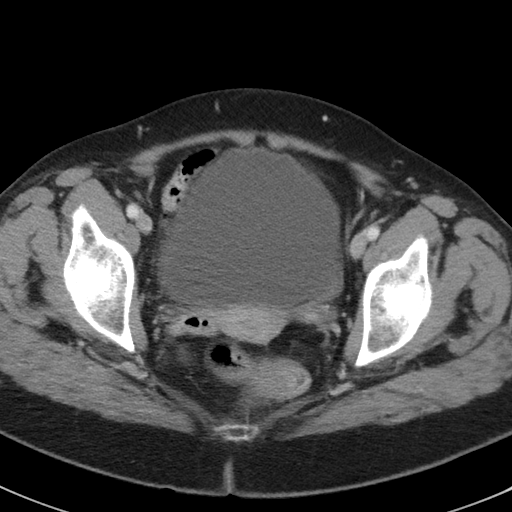
[im 24/87  soft-tissue]
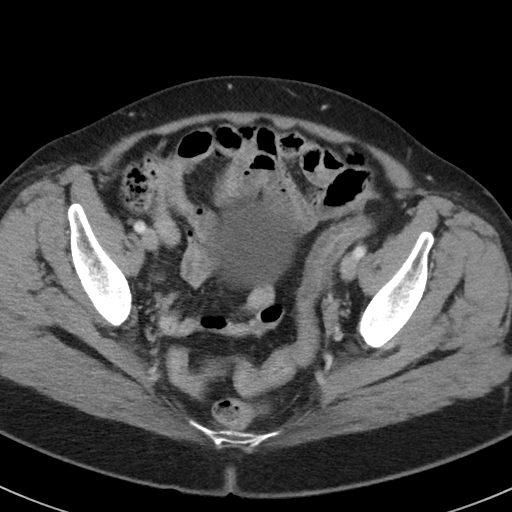
[im 29/87  soft-tissue]
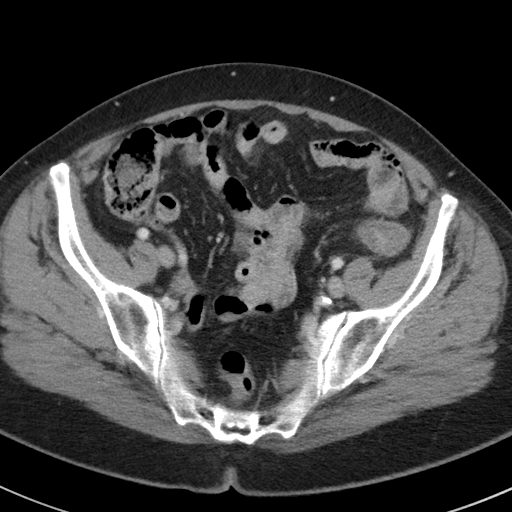
[im 39/87  soft-tissue]
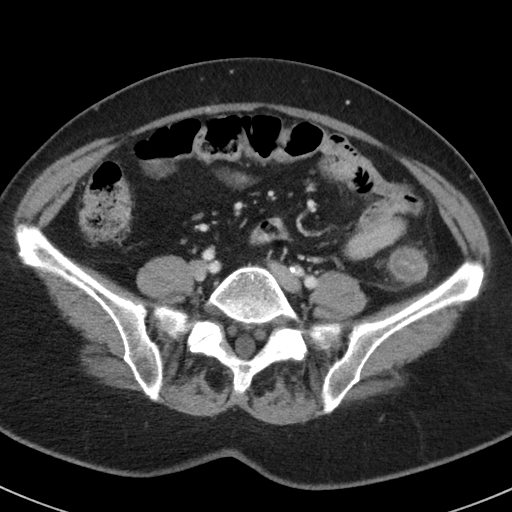
[im 44/87  soft-tissue]
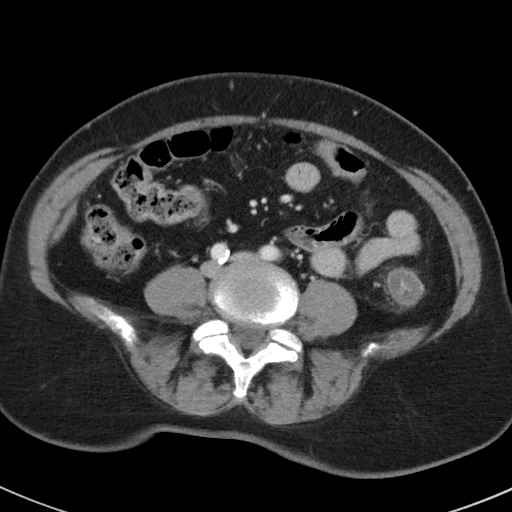
[im 48/87  soft-tissue]
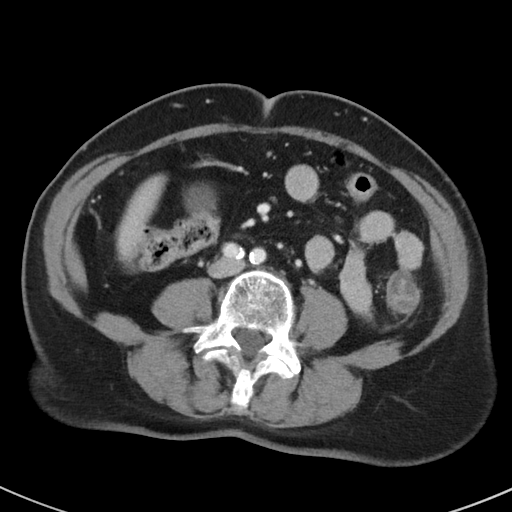
[im 58/87  soft-tissue]
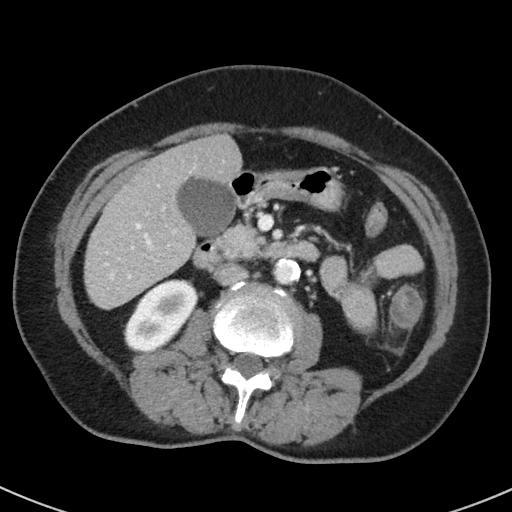
[im 58/87  bone]
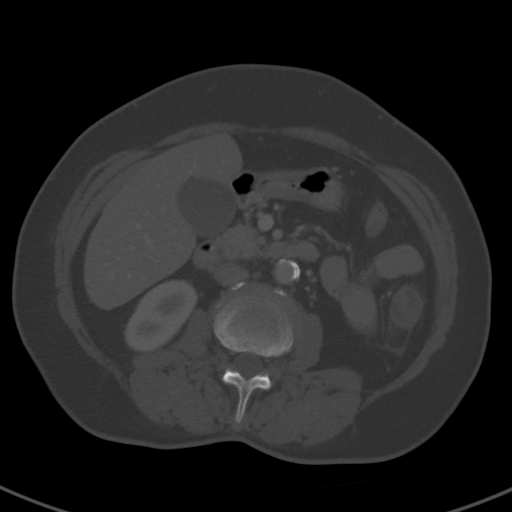
[im 63/87  soft-tissue]
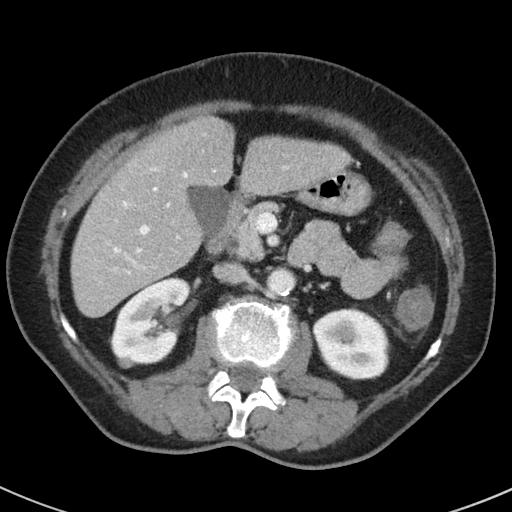
[im 67/87  soft-tissue]
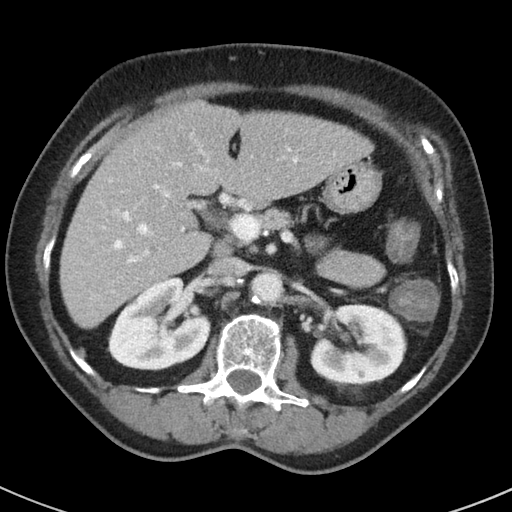
[im 77/87  soft-tissue]
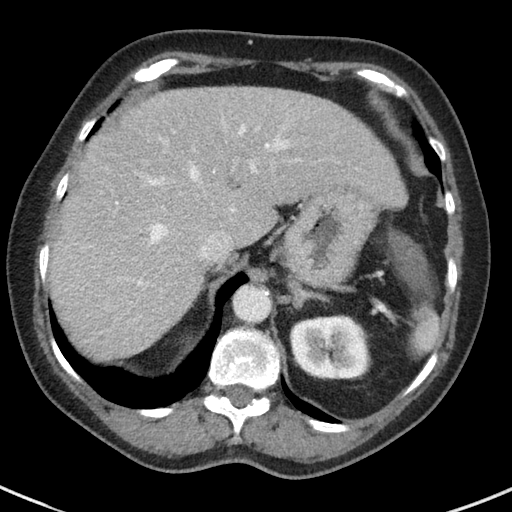
[im 82/87  soft-tissue]
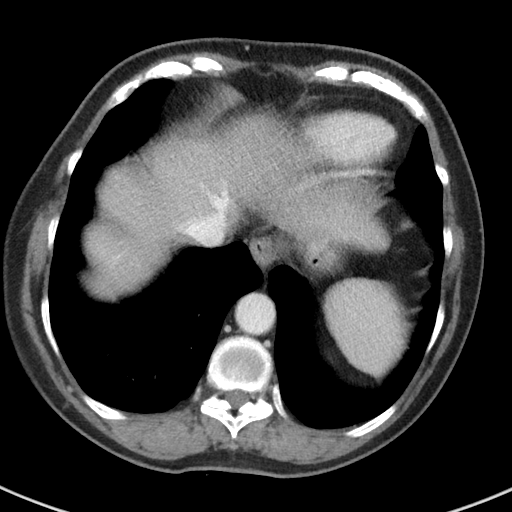

[Series 7: coronal st · coronal · 0.75mm/px · 3 of 87 slices shown]
[im 29/87  soft-tissue]
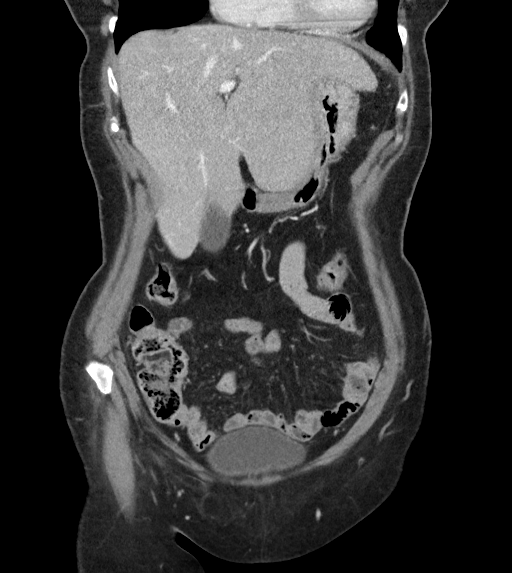
[im 39/87  soft-tissue]
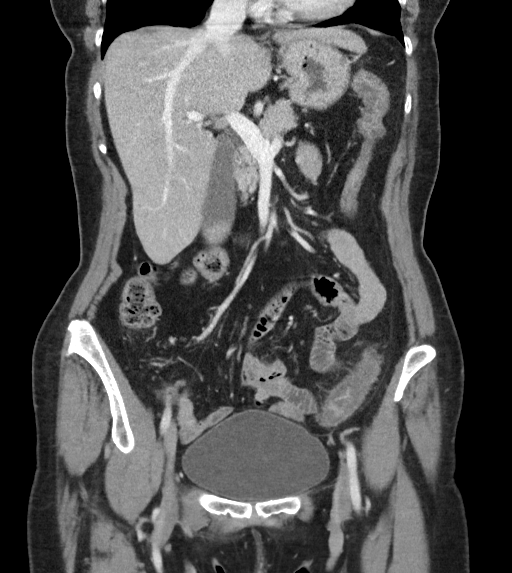
[im 48/87  soft-tissue]
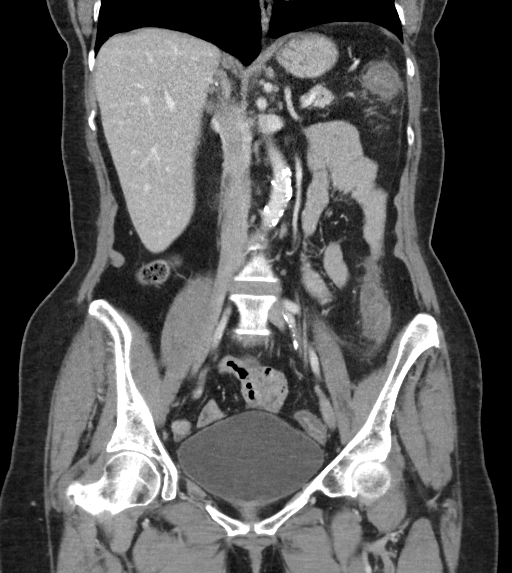

[16 of 46 positions shown; findings below may reference images not displayed]

FINDINGS: Lower chest: Lung bases clear

Hepatobiliary: Dependent increased attenuation within the
gallbladder likely representing cholelithiasis. Gallbladder is
distended without definite wall thickening or biliary dilatation.
Liver unremarkable.

Pancreas: Normal appearance

Spleen: Normal appearance

Adrenals/Urinary Tract: Thickening of LEFT adrenal gland with nodule
in the medial limb 18 x 9 mm demonstrating significant washout on
delayed images consistent with an adrenal adenoma. Thickening of
LEFT adrenal gland without mass. Exophytic cyst posterior mid LEFT
kidney 2.6 x 1.7 cm additional tiny cysts LEFT kidney. Intermediate
attenuation nodule at posterior aspect of mid LEFT kidney 9 x 6 mm,
slightly decreased in size since 4306 though slightly higher now in
attenuation. No hydronephrosis, hydroureter, or urinary tract
calcification. Bladder unremarkable.

Stomach/Bowel: Diffuse wall thickening of the colon from distal
transverse colon through sigmoid colon compatible with colitis. No
colonic diverticular identified. Minimal pericolic stranding at
descending colon and lateral Conal fascia. Proximal: Normal
appearance. Normal appendix. Stomach and small bowel loops
unremarkable. Duodenal and periduodenal changes seen on the previous
exam resolved.

Vascular/Lymphatic: Atherosclerotic calcification aorta and coronary
arteries. Aorta normal caliber without aneurysm. No adenopathy.

Reproductive: Unremarkable uterus and adnexa

Other: No free air or free fluid.  No hernia.

Musculoskeletal: Demineralized
IMPRESSION: Colitis from distal transverse colon through sigmoid colon ;
differential diagnosis would include inflammatory bowel disease and
infection, ischemia considered less likely.

BILATERAL renal cysts including an intermediate attenuation cyst at
the posterior RIGHT kidney which is minimally decreased in size
since previous exam.

Probable LEFT adrenal adenoma.

Aortic Atherosclerosis ([id number]-[id number].H).

## 2019-08-30 DIAGNOSIS — R928 Other abnormal and inconclusive findings on diagnostic imaging of breast: Secondary | ICD-10-CM | POA: Diagnosis not present

## 2019-08-30 DIAGNOSIS — N6321 Unspecified lump in the left breast, upper outer quadrant: Secondary | ICD-10-CM | POA: Diagnosis not present

## 2019-11-05 ENCOUNTER — Other Ambulatory Visit: Payer: Self-pay

## 2019-11-05 ENCOUNTER — Ambulatory Visit (INDEPENDENT_AMBULATORY_CARE_PROVIDER_SITE_OTHER): Payer: Medicare Other | Admitting: Family Medicine

## 2019-11-05 ENCOUNTER — Encounter: Payer: Self-pay | Admitting: Family Medicine

## 2019-11-05 VITALS — BP 137/88 | HR 86 | Temp 97.9°F | Ht 64.5 in | Wt 148.0 lb

## 2019-11-05 DIAGNOSIS — R4789 Other speech disturbances: Secondary | ICD-10-CM | POA: Diagnosis not present

## 2019-11-05 DIAGNOSIS — I10 Essential (primary) hypertension: Secondary | ICD-10-CM

## 2019-11-05 DIAGNOSIS — F418 Other specified anxiety disorders: Secondary | ICD-10-CM | POA: Diagnosis not present

## 2019-11-05 DIAGNOSIS — F329 Major depressive disorder, single episode, unspecified: Secondary | ICD-10-CM

## 2019-11-05 DIAGNOSIS — Z79899 Other long term (current) drug therapy: Secondary | ICD-10-CM | POA: Diagnosis not present

## 2019-11-05 DIAGNOSIS — F411 Generalized anxiety disorder: Secondary | ICD-10-CM

## 2019-11-05 DIAGNOSIS — E782 Mixed hyperlipidemia: Secondary | ICD-10-CM

## 2019-11-05 DIAGNOSIS — F32A Depression, unspecified: Secondary | ICD-10-CM

## 2019-11-05 DIAGNOSIS — R202 Paresthesia of skin: Secondary | ICD-10-CM | POA: Diagnosis not present

## 2019-11-05 DIAGNOSIS — R5382 Chronic fatigue, unspecified: Secondary | ICD-10-CM | POA: Diagnosis not present

## 2019-11-05 DIAGNOSIS — R5383 Other fatigue: Secondary | ICD-10-CM | POA: Diagnosis not present

## 2019-11-05 MED ORDER — CLOTRIMAZOLE 10 MG MT TROC: OROMUCOSAL | 2 refills | Status: DC

## 2019-11-05 MED ORDER — ESOMEPRAZOLE MAGNESIUM 20 MG PO CPDR: 20.0000 mg | DELAYED_RELEASE_CAPSULE | Freq: Every day | ORAL | 11 refills | Status: AC

## 2019-11-05 MED ORDER — VERAPAMIL HCL ER 240 MG PO TBCR: 240.0000 mg | EXTENDED_RELEASE_TABLET | Freq: Every day | ORAL | Status: DC

## 2019-11-05 MED ORDER — ALPRAZOLAM 0.25 MG PO TABS: ORAL_TABLET | ORAL | 1 refills | Status: DC

## 2019-11-05 MED ORDER — QUINAPRIL HCL 40 MG PO TABS: 40.0000 mg | ORAL_TABLET | Freq: Every day | ORAL | 3 refills | Status: DC

## 2019-11-05 MED ORDER — RALOXIFENE HCL 60 MG PO TABS: 60.0000 mg | ORAL_TABLET | Freq: Every day | ORAL | 3 refills | Status: DC

## 2019-11-05 NOTE — Progress Notes (Signed)
Subjective:  Patient ID: Lacey Christensen, female    DOB: 1946-03-12  Age: 74 y.o. MRN: [id number]  CC: Follow-up (6 month)   HPI Lacey Christensen presents for follow-up hypertension follow-up of hypertension. Patient has no history of headache chest pain or shortness of breath or recent cough. Patient also denies symptoms of TIA such as numbness weakness lateralizing. Patient checks  blood pressure at home and has not had any elevated readings recently. Patient denies side effects from his medication. States taking it regularly. Patient in for follow-up of elevated cholesterol. Doing well without complaints on current medication. Denies side effects of statin including myalgia and arthralgia and nausea. Also in today for liver function testing. Currently no chest pain, shortness of breath or other cardiovascular related symptoms noted.  Patient also is having a lot of burning sensation on her tongue that is been present for several months.  She says she has a white coating on her tongue at times that she has to remove with brushing her teeth.  However, it comes back.  She states that the Actonel was too expensive and Fosamax made her sick.  She needs something for her osteopenia, but she is concerned about taking more pills.  GAD-7 and PHQ-9 scores noted below are elevated.  Patient says that she is very worried about her nephew.  He has been diagnosed with Lewy body disease.  He is having memory troubles as a result of this diagnosis.  Additionally he recently developed heart trouble.  He is like a son to her and she is very worried about his health.  Patient sobs when discussing his illness.   GAD 7 : Generalized Anxiety Score 11/05/2019  Nervous, Anxious, on Edge 1  Control/stop worrying 3  Worry too much - different things 3  Trouble relaxing 1  Restless 0  Easily annoyed or irritable 2  Afraid - awful might happen 3  Total GAD 7 Score 13  Anxiety Difficulty Somewhat difficult       Depression screen [facility] 2/9 11/05/2019 05/08/2019 02/14/2018  Decreased Interest 2 0 0  Down, Depressed, Hopeless 1 0 0  PHQ - 2 Score 3 0 0  Altered sleeping 0 - 0  Tired, decreased energy 3 - 0  Change in appetite 0 - 0  Feeling bad or failure about yourself  2 - 0  Trouble concentrating 2 - 0  Moving slowly or fidgety/restless 0 - 0  Suicidal thoughts 0 - 0  PHQ-9 Score 10 - 0  Difficult doing work/chores Somewhat difficult - Not difficult at all    History Lacey Christensen has a past medical history of Cataract, Hypertension, Narrow angle glaucoma suspect of both eyes, and PUD (peptic ulcer disease).   She has a past surgical history that includes dilitation and curritage; Colonoscopy (N/A, 10/24/2014); and Esophagogastroduodenoscopy (N/A, 10/24/2014).   Her family history includes Diabetes in her mother; Hypertension in her father and mother.She reports that she has been smoking cigarettes. She has a 25.00 pack-year smoking history. She has never used smokeless tobacco. She reports that she does not drink alcohol. No history on file for drug use.    ROS Review of Systems  Constitutional: Negative.   HENT: Positive for mouth sores.   Eyes: Negative for visual disturbance.  Respiratory: Negative for shortness of breath.   Cardiovascular: Negative for chest pain.  Gastrointestinal: Negative for abdominal pain.  Musculoskeletal: Negative for arthralgias.    Objective:  BP 137/88   Pulse 86   Temp 97.9  F (36.6 C) (Temporal)   Ht 5' 4.5" (1.638 m)   Wt 148 lb (67.1 kg)   BMI 25.01 kg/m   BP Readings from Last 3 Encounters:  11/05/19 137/88  05/08/19 133/85  02/14/18 130/83    Wt Readings from Last 3 Encounters:  11/05/19 148 lb (67.1 kg)  05/08/19 147 lb 12.8 oz (67 kg)  02/14/18 145 lb (65.8 kg)     Physical Exam Constitutional:      General: She is not in acute distress.    Appearance: She is well-developed.  HENT:     Head: Normocephalic and atraumatic.      Mouth/Throat:     Pharynx: Oropharyngeal exudate (  Grayish-white plaque at the back of her tongue) present.  Eyes:     Conjunctiva/sclera: Conjunctivae normal.     Pupils: Pupils are equal, round, and reactive to light.  Neck:     Thyroid: No thyromegaly.  Cardiovascular:     Rate and Rhythm: Normal rate and regular rhythm.     Heart sounds: Normal heart sounds. No murmur heard.   Pulmonary:     Effort: Pulmonary effort is normal. No respiratory distress.     Breath sounds: Normal breath sounds. No wheezing or rales.  Abdominal:     General: Bowel sounds are normal. There is no distension.     Palpations: Abdomen is soft.     Tenderness: There is no abdominal tenderness.  Musculoskeletal:        General: Normal range of motion.     Cervical back: Normal range of motion and neck supple.  Lymphadenopathy:     Cervical: No cervical adenopathy.  Skin:    General: Skin is warm and dry.  Neurological:     Mental Status: She is alert and oriented to person, place, and time.  Psychiatric:        Behavior: Behavior normal.        Thought Content: Thought content normal.        Judgment: Judgment normal.       Assessment & Plan:   Lacey Christensen was seen today for follow-up.  Diagnoses and all orders for this visit:  Essential hypertension -     CBC with Differential/Platelet -     CMP14+EGFR -     quinapril (ACCUPRIL) 40 MG tablet; Take 1 tablet (40 mg total) by mouth daily. -     verapamil (CALAN-SR) 240 MG CR tablet; Take 1 tablet (240 mg total) by mouth daily.  Mixed hyperlipidemia -     CBC with Differential/Platelet -     CMP14+EGFR -     Lipid panel  Generalized anxiety disorder -     CBC with Differential/Platelet -     CMP14+EGFR -     ALPRAZolam (XANAX) 0.25 MG tablet; TAKE 1/2 TABLET 2 TIMES A DAY AS NEEDED FOR ANXIETY  Controlled substance agreement signed -     ToxASSURE Select 13 (MW), Urine -     CBC with Differential/Platelet -      CMP14+EGFR  Dysglossia -     CBC with Differential/Platelet -     CMP14+EGFR -     Vitamin B12 -     Folate  Fatigue due to depression -     TSH + free T4 -     CBC with Differential/Platelet -     CMP14+EGFR -     Vitamin B12 -     Folate  Depression with anxiety -  CBC with Differential/Platelet -     CMP14+EGFR  Chronic fatigue -     TSH + free T4  Paresthesias -     Vitamin B12 -     Folate  Other orders -     clotrimazole (MYCELEX) 10 MG troche; Allow one to dissolve in the mouth 5 times daily For yeast -     raloxifene (EVISTA) 60 MG tablet; Take 1 tablet (60 mg total) by mouth daily. For bone health -     esomeprazole (NEXIUM) 20 MG capsule; Take 1 capsule (20 mg total) by mouth daily.       I have discontinued Lacey Christensen's risedronate, metroNIDAZOLE, and ciprofloxacin. I have also changed her ALPRAZolam. Additionally, I am having her start on clotrimazole and raloxifene. Lastly, I am having her maintain her Calcium Carbonate-Vitamin D (CALCARB 600/D PO), diphenhydrAMINE, latanoprost, Krill Oil, Vitamin D, Aspirin-Acetaminophen-Caffeine (GOODY HEADACHE PO), multivitamin, esomeprazole, quinapril, and verapamil.  Allergies as of 11/05/2019      Reactions   Corn-containing Products Itching   Sulfa Antibiotics Other (See Comments)   Childhood reaction-unknown   Timolol Other (See Comments)   depression   Tape Rash      Medication List       Accurate as of November 05, 2019  9:45 PM. If you have any questions, ask your nurse or doctor.        STOP taking these medications   ciprofloxacin 500 MG tablet Commonly known as: Cipro Stopped by: Claretta Fraise, MD   metroNIDAZOLE 500 MG tablet Commonly known as: FLAGYL Stopped by: Claretta Fraise, MD   risedronate 150 MG tablet Commonly known as: Actonel Stopped by: Claretta Fraise, MD     TAKE these medications   ALPRAZolam 0.25 MG tablet Commonly known as: XANAX TAKE 1/2 TABLET 2 TIMES A DAY AS NEEDED  FOR ANXIETY What changed: additional instructions Changed by: Claretta Fraise, MD   CALCARB 600/D PO Take 1 tablet by mouth every evening.   clotrimazole 10 MG troche Commonly known as: MYCELEX Allow one to dissolve in the mouth 5 times daily For yeast Started by: Claretta Fraise, MD   diphenhydrAMINE 25 mg capsule Commonly known as: BENADRYL Take 25 mg by mouth every 6 (six) hours as needed for itching or allergies.   esomeprazole 20 MG capsule Commonly known as: NEXIUM Take 1 capsule (20 mg total) by mouth daily.   GOODY HEADACHE PO Take 1 packet by mouth daily as needed for headache.   Krill Oil 300 MG Caps Take 2 capsules (600 mg total) by mouth 2 (two) times daily.   latanoprost 0.005 % ophthalmic solution Commonly known as: XALATAN 1 drop.   multivitamin tablet Take 1 tablet by mouth daily.   quinapril 40 MG tablet Commonly known as: ACCUPRIL Take 1 tablet (40 mg total) by mouth daily.   raloxifene 60 MG tablet Commonly known as: Evista Take 1 tablet (60 mg total) by mouth daily. For bone health Started by: Claretta Fraise, MD   verapamil 240 MG CR tablet Commonly known as: CALAN-SR Take 1 tablet (240 mg total) by mouth daily.   Vitamin D 50 MCG (2000 UT) tablet Take 1 tablet (2,000 Units total) by mouth daily.        Follow-up: Return in about 6 weeks (around 12/17/2019), or if symptoms worsen or fail to improve.  Claretta Fraise, M.D.

## 2019-11-06 LAB — CBC WITH DIFFERENTIAL/PLATELET
Basophils Absolute: 0.1 10*3/uL (ref 0.0–0.2)
Basos: 1 %
EOS (ABSOLUTE): 0.5 10*3/uL — ABNORMAL HIGH (ref 0.0–0.4)
Eos: 5 %
Hematocrit: 41.7 % (ref 34.0–46.6)
Hemoglobin: 14 g/dL (ref 11.1–15.9)
Immature Grans (Abs): 0 10*3/uL (ref 0.0–0.1)
Immature Granulocytes: 0 %
Lymphocytes Absolute: 2.9 10*3/uL (ref 0.7–3.1)
Lymphs: 29 %
MCH: 31.7 pg (ref 26.6–33.0)
MCHC: 33.6 g/dL (ref 31.5–35.7)
MCV: 94 fL (ref 79–97)
Monocytes Absolute: 0.8 10*3/uL (ref 0.1–0.9)
Monocytes: 8 %
Neutrophils Absolute: 5.7 10*3/uL (ref 1.4–7.0)
Neutrophils: 57 %
Platelets: 441 10*3/uL (ref 150–450)
RBC: 4.42 x10E6/uL (ref 3.77–5.28)
RDW: 13 % (ref 11.7–15.4)
WBC: 10 10*3/uL (ref 3.4–10.8)

## 2019-11-06 LAB — LIPID PANEL
Chol/HDL Ratio: 6 ratio — ABNORMAL HIGH (ref 0.0–4.4)
Cholesterol, Total: 247 mg/dL — ABNORMAL HIGH (ref 100–199)
HDL: 41 mg/dL (ref >39)
LDL Chol Calc (NIH): 148 mg/dL — ABNORMAL HIGH (ref 0–99)
Triglycerides: 315 mg/dL — ABNORMAL HIGH (ref 0–149)
VLDL Cholesterol Cal: 58 mg/dL — ABNORMAL HIGH (ref 5–40)

## 2019-11-06 LAB — CMP14+EGFR
ALT: 21 IU/L (ref 0–32)
AST: 22 IU/L (ref 0–40)
Albumin/Globulin Ratio: 1.6 (ref 1.2–2.2)
Albumin: 4.6 g/dL (ref 3.7–4.7)
Alkaline Phosphatase: 70 IU/L (ref 48–121)
BUN/Creatinine Ratio: 15 (ref 12–28)
BUN: 12 mg/dL (ref 8–27)
Bilirubin Total: 0.2 mg/dL (ref 0.0–1.2)
CO2: 23 mmol/L (ref 20–29)
Calcium: 10.3 mg/dL (ref 8.7–10.3)
Chloride: 104 mmol/L (ref 96–106)
Creatinine, Ser: 0.78 mg/dL (ref 0.57–1.00)
GFR calc Af Amer: 87 mL/min/1.73
GFR calc non Af Amer: 76 mL/min/1.73
Globulin, Total: 2.8 g/dL (ref 1.5–4.5)
Glucose: 101 mg/dL — ABNORMAL HIGH (ref 65–99)
Potassium: 5.3 mmol/L — ABNORMAL HIGH (ref 3.5–5.2)
Sodium: 141 mmol/L (ref 134–144)
Total Protein: 7.4 g/dL (ref 6.0–8.5)

## 2019-11-06 LAB — TSH+FREE T4
Free T4: 0.93 ng/dL (ref 0.82–1.77)
TSH: 2.4 u[IU]/mL (ref 0.450–4.500)

## 2019-11-06 LAB — FOLATE: Folate: 16.9 ng/mL

## 2019-11-06 LAB — VITAMIN B12: Vitamin B-12: 400 pg/mL (ref 232–1245)

## 2019-11-07 ENCOUNTER — Telehealth: Payer: Self-pay | Admitting: Family Medicine

## 2019-11-07 LAB — TOXASSURE SELECT 13 (MW), URINE

## 2019-11-07 NOTE — Telephone Encounter (Signed)
Aware of results, also copy placed up front for pick up

## 2019-11-12 DIAGNOSIS — H401121 Primary open-angle glaucoma, left eye, mild stage: Secondary | ICD-10-CM | POA: Diagnosis not present

## 2019-11-12 DIAGNOSIS — H401113 Primary open-angle glaucoma, right eye, severe stage: Secondary | ICD-10-CM | POA: Diagnosis not present

## 2019-12-10 DIAGNOSIS — H401121 Primary open-angle glaucoma, left eye, mild stage: Secondary | ICD-10-CM | POA: Diagnosis not present

## 2019-12-10 DIAGNOSIS — H401113 Primary open-angle glaucoma, right eye, severe stage: Secondary | ICD-10-CM | POA: Diagnosis not present

## 2019-12-28 DIAGNOSIS — H401113 Primary open-angle glaucoma, right eye, severe stage: Secondary | ICD-10-CM | POA: Diagnosis not present

## 2019-12-28 DIAGNOSIS — H02052 Trichiasis without entropian right lower eyelid: Secondary | ICD-10-CM | POA: Diagnosis not present

## 2019-12-28 DIAGNOSIS — Z961 Presence of intraocular lens: Secondary | ICD-10-CM | POA: Diagnosis not present

## 2019-12-28 DIAGNOSIS — H43393 Other vitreous opacities, bilateral: Secondary | ICD-10-CM | POA: Diagnosis not present

## 2020-01-08 ENCOUNTER — Other Ambulatory Visit: Payer: Self-pay | Admitting: Family Medicine

## 2020-01-08 DIAGNOSIS — F411 Generalized anxiety disorder: Secondary | ICD-10-CM

## 2020-01-23 ENCOUNTER — Ambulatory Visit: Payer: Medicare Other | Admitting: *Deleted

## 2020-01-23 DIAGNOSIS — F411 Generalized anxiety disorder: Secondary | ICD-10-CM

## 2020-01-23 DIAGNOSIS — E782 Mixed hyperlipidemia: Secondary | ICD-10-CM

## 2020-01-23 DIAGNOSIS — I10 Essential (primary) hypertension: Secondary | ICD-10-CM

## 2020-01-23 NOTE — Chronic Care Management (AMB) (Signed)
Chronic Care Management   Initial Visit Note  01/23/2020 Name: Lacey Christensen MRN: [id number] DOB: 06/05/1945  Referred by: Claretta Fraise, MD Reason for referral : Chronic Care Management (Initial Visit)   Lacey Christensen is a 74 y.o. year old female who is a primary care patient of Stacks, Cletus Gash, MD. The CCM team was consulted for assistance with chronic disease management and care coordination needs related to HTN, glaucoma, HLD, GAD.  Review of patient status, including review of consultants reports, relevant laboratory and other test results, and collaboration with appropriate care team members and the patient's provider was performed as part of comprehensive patient evaluation and provision of chronic care management services.    Subjective: I talked with Lacey Christensen by telephone today regarding management of her chronic medical conditions.   SDOH (Social Determinants of Health) assessments performed: Yes See Care Plan activities for detailed interventions related to SDOH  SDOH Interventions     Most Recent Value  SDOH Interventions  Stress Interventions Other (Comment)  [recommended consult with LCSW. Patient will consider.]       Objective: Outpatient Encounter Medications as of 01/23/2020  Medication Sig Note  . ALPRAZolam (XANAX) 0.25 MG tablet TAKE 1/2 TABLET 2 TIMES A DAY AS NEEDED FOR ANXIETY   . Aspirin-Acetaminophen-Caffeine (GOODY HEADACHE PO) Take 1 packet by mouth daily as needed for headache.   . Calcium Carbonate-Vitamin D (CALCARB 600/D PO) Take 1 tablet by mouth every evening.   . Cholecalciferol (VITAMIN D) 2000 units tablet Take 1 tablet (2,000 Units total) by mouth daily.   . clotrimazole (MYCELEX) 10 MG troche Allow one to dissolve in the mouth 5 times daily For yeast   . diphenhydrAMINE (BENADRYL) 25 mg capsule Take 25 mg by mouth every 6 (six) hours as needed for itching or allergies.   Marland Kitchen esomeprazole (NEXIUM) 20 MG capsule Take 1 capsule (20 mg total) by  mouth daily.   Javier Docker Oil 300 MG CAPS Take 2 capsules (600 mg total) by mouth 2 (two) times daily.   Marland Kitchen latanoprost (XALATAN) 0.005 % ophthalmic solution 1 drop. 03/25/2015: Received from: [place]  . Multiple Vitamin (MULTIVITAMIN) tablet Take 1 tablet by mouth daily.   . quinapril (ACCUPRIL) 40 MG tablet Take 1 tablet (40 mg total) by mouth daily.   . raloxifene (EVISTA) 60 MG tablet Take 1 tablet (60 mg total) by mouth daily. For bone health   . verapamil (CALAN-SR) 240 MG CR tablet Take 1 tablet (240 mg total) by mouth daily.    No facility-administered encounter medications on file as of 01/23/2020.     BP Readings from Last 3 Encounters:  11/05/19 137/88  05/08/19 133/85  02/14/18 130/83   Lab Results  Component Value Date   CHOL 247 (H) 11/05/2019   HDL 41 11/05/2019   LDLCALC 148 (H) 11/05/2019   TRIG 315 (H) 11/05/2019   CHOLHDL 6.0 (H) 11/05/2019     Goals Addressed            This Visit's Progress   . Chronic Disease Management Needs       CARE PLAN ENTRY (see longtitudinal plan of care for additional care plan information)  Current Barriers:  . Chronic Disease Management support, education, and care coordination needs related to HTN, glaucoma, HLD, GAD  Clinical Goal(s) related to HTN, glaucoma, HLD, GAD:  Over the next 45 days, patient will:  . Work with the care management team to address educational, disease management, and care coordination needs  .  Call PCP with any new or worsening symptoms related to mouth pain . Call care management team with questions or concerns . Verbalize basic understanding of patient centered plan of care established today  Interventions related to HTN, glaucoma, HLD, GAD:  . Evaluation of current treatment plans and patient's adherence to plan as established by provider . Assessed patient understanding of disease states . Assessed patient's education and care coordination needs . Chart reviewed including recent office  notes and lab results . Provided disease specific education to patient  o Discussed hyperlipidemia with elevated triglycerides o Discussed pathophysiology of elevated triglycerides o Will mail educational resources on triglyceride and LDL management . Discussed diet o Patient cooks at home 1/2 time and eats out 1/2 time o Eats mainly vegetables with some lean meats . Encouraged patient to schedule f/u with PCP if oral pain worsens or does not improve o Treated with clotrimazole lozenges on 8/9. Has had improvement but not resolution.  o Advised that there are fills on that Rx but she does not want to use them because they caused nausea . Discussed physical activity, mobility, and ability to perform ADLs o Does not use any assistive devices o Does have problems with sustained standing or walking becasue it worsens vaginal prolapse . Encouraged patient to follow-up with Gyn, Dr Myrene Galas, regarding vaginal prolapse o Discussed most recent visit and recommendation for surgery o 3 options were given and patient hasn't decided on a treatment . Discussed family/social support o Talks with nieces/nephews several times a week o Lives with husband o Not as socially active and doesn't attend church currently due to Covid o Has some stress. Hx of GAD. - Recommended consult with LCSW. Pt declined but will consider . Collaborated with appropriate clinical care team members regarding patient needs . Provided with RN Care Manager telephone number and encouraged to reach out as needed . RN Care Manager follow-up scheduled for 03/04/20  Patient Self Care Activities related to HTN, glaucoma, HLD, GAD:  . Patient is unable to independently self-manage chronic health conditions  Initial goal documentation        Plan:  Telephone follow up appointment with care management team member scheduled for: 03/04/20 with RN Care Manager Follow up with provider re: new or worsening symptoms related to  mouth/tongue pain  Chong Sicilian, BSN, RN-BC [place] [facility] [place] Management Direct Dial: [phone]

## 2020-01-23 NOTE — Patient Instructions (Signed)
Visit Information  Goals Addressed            This Visit's Progress   . Chronic Disease Management Needs       CARE PLAN ENTRY (see longtitudinal plan of care for additional care plan information)  Current Barriers:  . Chronic Disease Management support, education, and care coordination needs related to HTN, glaucoma, HLD, GAD  Clinical Goal(s) related to HTN, glaucoma, HLD, GAD:  Over the next 45 days, patient will:  . Work with the care management team to address educational, disease management, and care coordination needs  . Call PCP with any new or worsening symptoms related to mouth pain . Call care management team with questions or concerns . Verbalize basic understanding of patient centered plan of care established today  Interventions related to HTN, glaucoma, HLD, GAD:  . Evaluation of current treatment plans and patient's adherence to plan as established by provider . Assessed patient understanding of disease states . Assessed patient's education and care coordination needs . Chart reviewed including recent office notes and lab results . Provided disease specific education to patient  o Discussed hyperlipidemia with elevated triglycerides o Discussed pathophysiology of elevated triglycerides o Will mail educational resources on triglyceride and LDL management . Discussed diet o Patient cooks at home 1/2 time and eats out 1/2 time o Eats mainly vegetables with some lean meats . Encouraged patient to schedule f/u with PCP if oral pain worsens or does not improve o Treated with clotrimazole lozenges on 8/9. Has had improvement but not resolution.  o Advised that there are fills on that Rx but she does not want to use them because they caused nausea . Discussed physical activity, mobility, and ability to perform ADLs o Does not use any assistive devices o Does have problems with sustained standing or walking becasue it worsens vaginal prolapse . Encouraged patient to  follow-up with Gyn, Dr Myrene Galas, regarding vaginal prolapse o Discussed most recent visit and recommendation for surgery o 3 options were given and patient hasn't decided on a treatment . Discussed family/social support o Talks with nieces/nephews several times a week o Lives with husband o Not as socially active and doesn't attend church currently due to Covid o Has some stress. Hx of GAD. - Recommended consult with LCSW. Pt declined but will consider . Collaborated with appropriate clinical care team members regarding patient needs . Provided with RN Care Manager telephone number and encouraged to reach out as needed . RN Care Manager follow-up scheduled for 03/04/20  Patient Self Care Activities related to HTN, glaucoma, HLD, GAD:  . Patient is unable to independently self-manage chronic health conditions  Initial goal documentation        Ms. Poust was given information about Chronic Care Management services including:  1. CCM service includes personalized support from designated clinical staff supervised by her physician, including individualized plan of care and coordination with other care providers 2. 24/7 contact phone numbers for assistance for urgent and routine care needs. 3. Service will only be billed when office clinical staff spend 20 minutes or more in a month to coordinate care. 4. Only one practitioner may furnish and bill the service in a calendar month. 5. The patient may stop CCM services at any time (effective at the end of the month) by phone call to the office staff. 6. The patient will be responsible for cost sharing (co-pay) of up to 20% of the service fee (after annual deductible is met).  Patient agreed  to services and verbal consent obtained.   Patient verbalizes understanding of instructions provided today.    Plan:  Telephone follow up appointment with care management team member scheduled for: 03/04/20 with RN Care Manager Follow up with provider  re: new or worsening symptoms related to mouth/tongue pain  Chong Sicilian, BSN, RN-BC [place] [facility] [place] Management Direct Dial: [phone]

## 2020-02-26 DIAGNOSIS — L821 Other seborrheic keratosis: Secondary | ICD-10-CM | POA: Diagnosis not present

## 2020-02-26 DIAGNOSIS — L720 Epidermal cyst: Secondary | ICD-10-CM | POA: Diagnosis not present

## 2020-02-26 DIAGNOSIS — L814 Other melanin hyperpigmentation: Secondary | ICD-10-CM | POA: Diagnosis not present

## 2020-02-26 DIAGNOSIS — L579 Skin changes due to chronic exposure to nonionizing radiation, unspecified: Secondary | ICD-10-CM | POA: Diagnosis not present

## 2020-02-26 DIAGNOSIS — L570 Actinic keratosis: Secondary | ICD-10-CM | POA: Diagnosis not present

## 2020-03-04 ENCOUNTER — Telehealth: Payer: Medicare Other | Admitting: *Deleted

## 2020-05-19 DIAGNOSIS — H401113 Primary open-angle glaucoma, right eye, severe stage: Secondary | ICD-10-CM | POA: Diagnosis not present

## 2020-05-19 DIAGNOSIS — H401121 Primary open-angle glaucoma, left eye, mild stage: Secondary | ICD-10-CM | POA: Diagnosis not present

## 2020-07-12 ENCOUNTER — Other Ambulatory Visit: Payer: Self-pay | Admitting: Family Medicine

## 2020-07-12 DIAGNOSIS — F411 Generalized anxiety disorder: Secondary | ICD-10-CM

## 2020-07-30 ENCOUNTER — Other Ambulatory Visit: Payer: Self-pay

## 2020-07-30 ENCOUNTER — Ambulatory Visit (INDEPENDENT_AMBULATORY_CARE_PROVIDER_SITE_OTHER): Payer: Medicare Other | Admitting: Family Medicine

## 2020-07-30 ENCOUNTER — Encounter: Payer: Self-pay | Admitting: Family Medicine

## 2020-07-30 ENCOUNTER — Ambulatory Visit (INDEPENDENT_AMBULATORY_CARE_PROVIDER_SITE_OTHER): Payer: Medicare Other

## 2020-07-30 VITALS — BP 126/68 | HR 91 | Temp 97.7°F | Ht 64.5 in | Wt 148.4 lb

## 2020-07-30 DIAGNOSIS — I10 Essential (primary) hypertension: Secondary | ICD-10-CM | POA: Diagnosis not present

## 2020-07-30 DIAGNOSIS — F17200 Nicotine dependence, unspecified, uncomplicated: Secondary | ICD-10-CM | POA: Diagnosis not present

## 2020-07-30 DIAGNOSIS — Z780 Asymptomatic menopausal state: Secondary | ICD-10-CM

## 2020-07-30 DIAGNOSIS — K219 Gastro-esophageal reflux disease without esophagitis: Secondary | ICD-10-CM

## 2020-07-30 DIAGNOSIS — F17219 Nicotine dependence, cigarettes, with unspecified nicotine-induced disorders: Secondary | ICD-10-CM | POA: Diagnosis not present

## 2020-07-30 DIAGNOSIS — Z122 Encounter for screening for malignant neoplasm of respiratory organs: Secondary | ICD-10-CM | POA: Diagnosis not present

## 2020-07-30 DIAGNOSIS — M810 Age-related osteoporosis without current pathological fracture: Secondary | ICD-10-CM | POA: Diagnosis not present

## 2020-07-30 DIAGNOSIS — M542 Cervicalgia: Secondary | ICD-10-CM | POA: Diagnosis not present

## 2020-07-30 DIAGNOSIS — M47812 Spondylosis without myelopathy or radiculopathy, cervical region: Secondary | ICD-10-CM | POA: Diagnosis not present

## 2020-07-30 DIAGNOSIS — F411 Generalized anxiety disorder: Secondary | ICD-10-CM

## 2020-07-30 DIAGNOSIS — R5382 Chronic fatigue, unspecified: Secondary | ICD-10-CM | POA: Diagnosis not present

## 2020-07-30 LAB — URINALYSIS
Bilirubin, UA: NEGATIVE
Glucose, UA: NEGATIVE
Ketones, UA: NEGATIVE
Leukocytes,UA: NEGATIVE
Nitrite, UA: NEGATIVE
Protein,UA: NEGATIVE
Specific Gravity, UA: 1.015 (ref 1.005–1.030)
Urobilinogen, Ur: 0.2 mg/dL (ref 0.2–1.0)
pH, UA: 7 (ref 5.0–7.5)

## 2020-07-30 MED ORDER — ALPRAZOLAM 0.25 MG PO TABS: ORAL_TABLET | ORAL | 1 refills | Status: DC

## 2020-07-30 MED ORDER — ALPRAZOLAM 0.25 MG PO TABS
ORAL_TABLET | ORAL | 1 refills | Status: DC
Start: 2020-07-30 — End: 2021-01-30

## 2020-07-30 NOTE — Progress Notes (Signed)
Subjective:  Patient ID: Lacey Christensen, female    DOB: 03-Oct-1945  Age: 75 y.o. MRN: [id number]  CC: Medication Refill   HPI Lacey Christensen presents for daily neck and shoulder pain. "Nagging ache" every morning. Requires the Goody powder to relieve. Awakens tense. Sleeps well.   Smoking 52 years. mMRC group 0. Can walk without dyspnea. Smoker 40 pack year hx  Depression screen [facility] 2/9 07/30/2020 11/05/2019 05/08/2019  Decreased Interest 1 2 0  Down, Depressed, Hopeless 1 1 0  PHQ - 2 Score 2 3 0  Altered sleeping 1 0 -  Tired, decreased energy 1 3 -  Change in appetite 2 0 -  Feeling bad or failure about yourself  1 2 -  Trouble concentrating 0 2 -  Moving slowly or fidgety/restless 0 0 -  Suicidal thoughts 0 0 -  PHQ-9 Score 7 10 -  Difficult doing work/chores Somewhat difficult Somewhat difficult -    History Lacey Christensen has a past medical history of Cataract, Hypertension, Narrow angle glaucoma suspect of both eyes, and PUD (peptic ulcer disease).   She has a past surgical history that includes dilitation and curritage; Colonoscopy (N/A, 10/24/2014); and Esophagogastroduodenoscopy (N/A, 10/24/2014).   Her family history includes Diabetes in her mother; Hypertension in her father and mother.She reports that she has been smoking cigarettes. She has a 25.00 pack-year smoking history. She has never used smokeless tobacco. She reports that she does not drink alcohol. No history on Christensen for drug use.    ROS Review of Systems  Constitutional: Positive for fatigue.  HENT: Negative.   Eyes: Negative for visual disturbance.  Respiratory: Negative for shortness of breath.   Cardiovascular: Negative for chest pain.  Gastrointestinal: Negative for abdominal pain.  Musculoskeletal: Negative for arthralgias.    Objective:  BP 126/68   Pulse 91   Temp 97.7 F (36.5 C)   Ht 5' 4.5" (1.638 m)   Wt 148 lb 6.4 oz (67.3 kg)   SpO2 97%   BMI 25.08 kg/m   BP Readings from Last 3 Encounters:   07/30/20 126/68  11/05/19 137/88  05/08/19 133/85    Wt Readings from Last 3 Encounters:  07/30/20 148 lb 6.4 oz (67.3 kg)  11/05/19 148 lb (67.1 kg)  05/08/19 147 lb 12.8 oz (67 kg)     Physical Exam Constitutional:      General: She is not in acute distress.    Appearance: She is well-developed.  HENT:     Head: Normocephalic and atraumatic.  Eyes:     Conjunctiva/sclera: Conjunctivae normal.     Pupils: Pupils are equal, round, and reactive to light.  Neck:     Thyroid: No thyromegaly.  Cardiovascular:     Rate and Rhythm: Normal rate and regular rhythm.     Heart sounds: Normal heart sounds. No murmur heard.   Pulmonary:     Effort: Pulmonary effort is normal. No respiratory distress.     Breath sounds: Normal breath sounds. No wheezing or rales.  Abdominal:     General: Bowel sounds are normal. There is no distension.     Palpations: Abdomen is soft.     Tenderness: There is no abdominal tenderness.  Musculoskeletal:        General: Normal range of motion.     Cervical back: Normal range of motion and neck supple.  Lymphadenopathy:     Cervical: No cervical adenopathy.  Skin:    General: Skin is warm and dry.  Neurological:  Mental Status: She is alert and oriented to person, place, and time.  Psychiatric:        Behavior: Behavior normal.        Thought Content: Thought content normal.        Judgment: Judgment normal.       Assessment & Plan:   Lacey Christensen was seen today for medication refill.  Diagnoses and all orders for this visit:  Chronic fatigue -     CBC with Differential/Platelet -     CMP14+EGFR -     TSH + free T4 -     Urinalysis -     Sedimentation rate  Generalized anxiety disorder -     Discontinue: ALPRAZolam (XANAX) 0.25 MG tablet; TAKE 1/2 TABLET 2 TIMES A DAY AS NEEDED FOR ANXIETY -     CBC with Differential/Platelet -     CMP14+EGFR -     Urinalysis -     Sedimentation rate -     ALPRAZolam (XANAX) 0.25 MG tablet; TAKE  1/2 TABLET 2 TIMES A DAY AS NEEDED FOR ANXIETY  Essential hypertension -     CBC with Differential/Platelet -     CMP14+EGFR -     Urinalysis -     Sedimentation rate  Gastroesophageal reflux disease, unspecified whether esophagitis present -     CBC with Differential/Platelet -     CMP14+EGFR -     Urinalysis -     Sedimentation rate  Current every day smoker  Neck pain -     DG Cervical Spine Complete; Future -     CBC with Differential/Platelet -     CMP14+EGFR -     Urinalysis -     Sedimentation rate  Screening for lung cancer -     CT CHEST LUNG CANCER SCREENING LOW DOSE WO CONTRAST; Future  Nicotine dependence, cigarettes, with unspecified nicotine-induced disorders  -     CT CHEST LUNG CANCER SCREENING LOW DOSE WO CONTRAST; Future     Smoking cessation encouraged  I am having Lacey Christensen maintain her Calcium Carbonate-Vitamin D (CALCARB 600/D PO), diphenhydrAMINE, latanoprost, Krill Oil, Vitamin D, Aspirin-Acetaminophen-Caffeine (GOODY HEADACHE PO), multivitamin, clotrimazole, raloxifene, esomeprazole, quinapril, verapamil, and ALPRAZolam.  Allergies as of 07/30/2020      Reactions   Corn-containing Products Itching   Sulfa Antibiotics Other (See Comments)   Childhood reaction-unknown   Timolol Other (See Comments)   depression   Tape Rash      Medication List       Accurate as of Jul 30, 2020 11:59 PM. If you have any questions, ask your nurse or doctor.        ALPRAZolam 0.25 MG tablet Commonly known as: XANAX TAKE 1/2 TABLET 2 TIMES A DAY AS NEEDED FOR ANXIETY   CALCARB 600/D PO Take 1 tablet by mouth every evening.   clotrimazole 10 MG troche Commonly known as: MYCELEX Allow one to dissolve in the mouth 5 times daily For yeast   diphenhydrAMINE 25 mg capsule Commonly known as: BENADRYL Take 25 mg by mouth every 6 (six) hours as needed for itching or allergies.   esomeprazole 20 MG capsule Commonly known as: NEXIUM Take 1 capsule (20 mg  total) by mouth daily.   GOODY HEADACHE PO Take 1 packet by mouth daily as needed for headache.   Krill Oil 300 MG Caps Take 2 capsules (600 mg total) by mouth 2 (two) times daily.   latanoprost 0.005 % ophthalmic solution Commonly known as: Ivin Poot  1 drop.   multivitamin tablet Take 1 tablet by mouth daily.   quinapril 40 MG tablet Commonly known as: ACCUPRIL Take 1 tablet (40 mg total) by mouth daily.   raloxifene 60 MG tablet Commonly known as: Evista Take 1 tablet (60 mg total) by mouth daily. For bone health   verapamil 240 MG CR tablet Commonly known as: CALAN-SR Take 1 tablet (240 mg total) by mouth daily.   Vitamin D 50 MCG (2000 UT) tablet Take 1 tablet (2,000 Units total) by mouth daily.        Follow-up: Return in about 6 weeks (around 09/10/2020).  Claretta Fraise, M.D.

## 2020-07-31 LAB — CBC WITH DIFFERENTIAL/PLATELET
Basophils Absolute: 0.2 10*3/uL (ref 0.0–0.2)
Basos: 2 %
EOS (ABSOLUTE): 0.7 10*3/uL — ABNORMAL HIGH (ref 0.0–0.4)
Eos: 7 %
Hematocrit: 42.7 % (ref 34.0–46.6)
Hemoglobin: 14.3 g/dL (ref 11.1–15.9)
Immature Grans (Abs): 0 10*3/uL (ref 0.0–0.1)
Immature Granulocytes: 0 %
Lymphocytes Absolute: 3.3 10*3/uL — ABNORMAL HIGH (ref 0.7–3.1)
Lymphs: 32 %
MCH: 31.9 pg (ref 26.6–33.0)
MCHC: 33.5 g/dL (ref 31.5–35.7)
MCV: 95 fL (ref 79–97)
Monocytes Absolute: 0.9 10*3/uL (ref 0.1–0.9)
Monocytes: 9 %
Neutrophils Absolute: 5.1 10*3/uL (ref 1.4–7.0)
Neutrophils: 50 %
Platelets: 454 10*3/uL — ABNORMAL HIGH (ref 150–450)
RBC: 4.48 x10E6/uL (ref 3.77–5.28)
RDW: 13.3 % (ref 11.7–15.4)
WBC: 10.1 10*3/uL (ref 3.4–10.8)

## 2020-07-31 LAB — CMP14+EGFR
ALT: 20 IU/L (ref 0–32)
AST: 18 IU/L (ref 0–40)
Albumin/Globulin Ratio: 1.3 (ref 1.2–2.2)
Albumin: 4.3 g/dL (ref 3.7–4.7)
Alkaline Phosphatase: 68 IU/L (ref 44–121)
BUN/Creatinine Ratio: 17 (ref 12–28)
BUN: 15 mg/dL (ref 8–27)
Bilirubin Total: 0.3 mg/dL (ref 0.0–1.2)
CO2: 22 mmol/L (ref 20–29)
Calcium: 10.5 mg/dL — ABNORMAL HIGH (ref 8.7–10.3)
Chloride: 103 mmol/L (ref 96–106)
Creatinine, Ser: 0.9 mg/dL (ref 0.57–1.00)
Globulin, Total: 3.3 g/dL (ref 1.5–4.5)
Glucose: 101 mg/dL — ABNORMAL HIGH (ref 65–99)
Potassium: 5.4 mmol/L — ABNORMAL HIGH (ref 3.5–5.2)
Sodium: 140 mmol/L (ref 134–144)
Total Protein: 7.6 g/dL (ref 6.0–8.5)
eGFR: 67 mL/min/{1.73_m2} (ref >59)

## 2020-07-31 LAB — TSH+FREE T4
Free T4: 0.87 ng/dL (ref 0.82–1.77)
TSH: 2.07 u[IU]/mL (ref 0.450–4.500)

## 2020-07-31 LAB — SEDIMENTATION RATE: Sed Rate: 22 mm/hr (ref 0–40)

## 2020-08-03 NOTE — Progress Notes (Signed)
Hello Shantrice,  Your lab result is normal and/or stable.Some minor variations that are not significant are commonly marked abnormal, but do not represent any medical problem for you.  Best regards, Claretta Fraise, M.D.

## 2020-08-18 ENCOUNTER — Encounter (HOSPITAL_COMMUNITY): Payer: Self-pay

## 2020-08-18 NOTE — Progress Notes (Signed)
Lung Cancer Screening Referral received from Dr. Livia Snellen. Call placed to patient and discussed LCS. Patient not interested at this time, states she is preparing for surgery and her lungs and heart will be checked prior to that. PCP made aware that I will close the referral at this time and to re-refer should the patient change her mind.

## 2020-09-04 DIAGNOSIS — Z1231 Encounter for screening mammogram for malignant neoplasm of breast: Secondary | ICD-10-CM | POA: Diagnosis not present

## 2020-09-16 ENCOUNTER — Ambulatory Visit (INDEPENDENT_AMBULATORY_CARE_PROVIDER_SITE_OTHER): Payer: Medicare Other | Admitting: Family Medicine

## 2020-09-16 ENCOUNTER — Other Ambulatory Visit: Payer: Self-pay

## 2020-09-16 ENCOUNTER — Encounter: Payer: Self-pay | Admitting: Family Medicine

## 2020-09-16 VITALS — BP 143/79 | HR 95 | Temp 97.1°F | Ht 64.5 in | Wt 147.8 lb

## 2020-09-16 DIAGNOSIS — I10 Essential (primary) hypertension: Secondary | ICD-10-CM | POA: Diagnosis not present

## 2020-09-16 DIAGNOSIS — R5383 Other fatigue: Secondary | ICD-10-CM | POA: Diagnosis not present

## 2020-09-16 DIAGNOSIS — N8110 Cystocele, unspecified: Secondary | ICD-10-CM

## 2020-09-16 NOTE — Progress Notes (Signed)
Subjective:  Patient ID: Lacey Christensen, female    DOB: Jan 14, 1946  Age: 75 y.o. MRN: [id number]  CC: Follow-up   HPI Lacey Christensen presents for recheck of fatigue. Feeling better.  Considering vaginal prolapse surgery. Concerned because she hasn't ever had surgery before. Seeing Dr. Myrene Galas in [place].    presents for  follow-up of hypertension. Patient has no history of headache chest pain or shortness of breath or recent cough. Patient also denies symptoms of TIA such as focal numbness or weakness. Patient denies side effects from medication. States taking it regularly.  Depression screen [facility] 2/9 09/16/2020 09/16/2020 07/30/2020  Decreased Interest 1 0 1  Down, Depressed, Hopeless 1 0 1  PHQ - 2 Score 2 0 2  Altered sleeping 0 - 1  Tired, decreased energy 1 - 1  Change in appetite 1 - 2  Feeling bad or failure about yourself  1 - 1  Trouble concentrating 0 - 0  Moving slowly or fidgety/restless 0 - 0  Suicidal thoughts 0 - 0  PHQ-9 Score 5 - 7  Difficult doing work/chores Not difficult at all - Somewhat difficult    History Lacey Christensen has a past medical history of Cataract, Hypertension, Narrow angle glaucoma suspect of both eyes, and PUD (peptic ulcer disease).   She has a past surgical history that includes dilitation and curritage; Colonoscopy (N/A, 10/24/2014); and Esophagogastroduodenoscopy (N/A, 10/24/2014).   Her family history includes Diabetes in her mother; Hypertension in her father and mother.She reports that she has been smoking cigarettes. She has a 25.00 pack-year smoking history. She has never used smokeless tobacco. She reports that she does not drink alcohol. No history on file for drug use.    ROS Review of Systems  Constitutional: Negative.   HENT: Negative.    Eyes:  Negative for visual disturbance.  Respiratory:  Negative for shortness of breath.   Cardiovascular:  Negative for chest pain.  Gastrointestinal:  Negative for abdominal pain.  Musculoskeletal:   Negative for arthralgias.   Objective:  BP (!) 143/79   Pulse 95   Temp (!) 97.1 F (36.2 C)   Ht 5' 4.5" (1.638 m)   Wt 147 lb 12.8 oz (67 kg)   SpO2 99%   BMI 24.98 kg/m   BP Readings from Last 3 Encounters:  09/16/20 (!) 143/79  07/30/20 126/68  11/05/19 137/88    Wt Readings from Last 3 Encounters:  09/16/20 147 lb 12.8 oz (67 kg)  07/30/20 148 lb 6.4 oz (67.3 kg)  11/05/19 148 lb (67.1 kg)     Physical Exam Constitutional:      General: She is not in acute distress.    Appearance: She is well-developed.  HENT:     Head: Normocephalic and atraumatic.  Eyes:     Conjunctiva/sclera: Conjunctivae normal.     Pupils: Pupils are equal, round, and reactive to light.  Neck:     Thyroid: No thyromegaly.  Cardiovascular:     Rate and Rhythm: Normal rate and regular rhythm.     Heart sounds: Normal heart sounds. No murmur heard. Pulmonary:     Effort: Pulmonary effort is normal. No respiratory distress.     Breath sounds: Normal breath sounds. No wheezing or rales.  Abdominal:     General: There is no distension.     Palpations: Abdomen is soft.     Tenderness: There is no abdominal tenderness.  Musculoskeletal:        General: Normal range of motion.  Cervical back: Normal range of motion and neck supple.  Lymphadenopathy:     Cervical: No cervical adenopathy.  Skin:    General: Skin is warm and dry.  Neurological:     Mental Status: She is alert and oriented to person, place, and time.  Psychiatric:        Behavior: Behavior normal.        Thought Content: Thought content normal.        Judgment: Judgment normal.      Assessment & Plan:   Lacey Christensen was seen today for follow-up.  Diagnoses and all orders for this visit:  Fatigue, unspecified type  Essential hypertension  Vaginal prolapse      I am having [name] maintain her Calcium Carbonate-Vitamin D (CALCARB 600/D PO), diphenhydrAMINE, latanoprost, Krill Oil, Vitamin D,  Aspirin-Acetaminophen-Caffeine (GOODY HEADACHE PO), multivitamin, clotrimazole, raloxifene, esomeprazole, quinapril, verapamil, and ALPRAZolam.  Allergies as of 09/16/2020       Reactions   Corn-containing Products Itching   Sulfa Antibiotics Other (See Comments)   Childhood reaction-unknown   Timolol Other (See Comments)   depression   Tape Rash        Medication List        Accurate as of September 16, 2020  8:52 PM. If you have any questions, ask your nurse or doctor.          ALPRAZolam 0.25 MG tablet Commonly known as: XANAX TAKE 1/2 TABLET 2 TIMES A DAY AS NEEDED FOR ANXIETY   CALCARB 600/D PO Take 1 tablet by mouth every evening.   clotrimazole 10 MG troche Commonly known as: MYCELEX Allow one to dissolve in the mouth 5 times daily For yeast   diphenhydrAMINE 25 mg capsule Commonly known as: BENADRYL Take 25 mg by mouth every 6 (six) hours as needed for itching or allergies.   esomeprazole 20 MG capsule Commonly known as: NEXIUM Take 1 capsule (20 mg total) by mouth daily.   GOODY HEADACHE PO Take 1 packet by mouth daily as needed for headache.   Krill Oil 300 MG Caps Take 2 capsules (600 mg total) by mouth 2 (two) times daily.   latanoprost 0.005 % ophthalmic solution Commonly known as: XALATAN 1 drop.   multivitamin tablet Take 1 tablet by mouth daily.   quinapril 40 MG tablet Commonly known as: ACCUPRIL Take 1 tablet (40 mg total) by mouth daily.   raloxifene 60 MG tablet Commonly known as: Evista Take 1 tablet (60 mg total) by mouth daily. For bone health   verapamil 240 MG CR tablet Commonly known as: CALAN-SR Take 1 tablet (240 mg total) by mouth daily.   Vitamin D 50 MCG (2000 UT) tablet Take 1 tablet (2,000 Units total) by mouth daily.         Follow-up: Return in about 6 months (around 03/18/2021) for [facility] physical.  Claretta Fraise, M.D.

## 2020-11-10 DIAGNOSIS — H401113 Primary open-angle glaucoma, right eye, severe stage: Secondary | ICD-10-CM | POA: Diagnosis not present

## 2020-11-10 DIAGNOSIS — H401121 Primary open-angle glaucoma, left eye, mild stage: Secondary | ICD-10-CM | POA: Diagnosis not present

## 2020-12-05 ENCOUNTER — Other Ambulatory Visit: Payer: Self-pay | Admitting: Family Medicine

## 2020-12-05 DIAGNOSIS — I10 Essential (primary) hypertension: Secondary | ICD-10-CM

## 2020-12-29 ENCOUNTER — Other Ambulatory Visit: Payer: Self-pay | Admitting: Family Medicine

## 2020-12-29 DIAGNOSIS — I10 Essential (primary) hypertension: Secondary | ICD-10-CM

## 2021-01-27 ENCOUNTER — Other Ambulatory Visit: Payer: Self-pay

## 2021-01-27 ENCOUNTER — Encounter: Payer: Self-pay | Admitting: Family Medicine

## 2021-01-27 ENCOUNTER — Ambulatory Visit (INDEPENDENT_AMBULATORY_CARE_PROVIDER_SITE_OTHER): Payer: Medicare Other | Admitting: Family Medicine

## 2021-01-27 VITALS — BP 144/78 | HR 86 | Temp 97.2°F | Ht 64.5 in | Wt 145.8 lb

## 2021-01-27 DIAGNOSIS — M5431 Sciatica, right side: Secondary | ICD-10-CM | POA: Diagnosis not present

## 2021-01-27 MED ORDER — BETAMETHASONE SOD PHOS & ACET 6 (3-3) MG/ML IJ SUSP
6.0000 mg | Freq: Once | INTRAMUSCULAR | Status: AC
  Administered 2021-01-27: 6 mg via INTRAMUSCULAR

## 2021-01-27 MED ORDER — PREDNISONE 10 MG (48) PO TBPK: ORAL_TABLET | ORAL | 0 refills | Status: DC

## 2021-01-27 NOTE — Progress Notes (Addendum)
Subjective:  Patient ID: Lacey Christensen, female    DOB: 1945-11-25  Age: 75 y.o. MRN: [id number]  CC: Back Pain, Hip Pain, and Knee Pain   HPI Lacey Christensen presents for back, hip and knee pain onset 10/20. After vacuuming felt stiff, tight. Radiated from back to hip to groin and down past knee. 4/10 baseline with flares to 9/10. Walking doesn't hurt. Standing still hurts more.   Depression screen [facility] 2/9 01/27/2021 09/16/2020 09/16/2020  Decreased Interest 1 1 0  Down, Depressed, Hopeless 1 1 0  PHQ - 2 Score 2 2 0  Altered sleeping 0 0 -  Tired, decreased energy 0 1 -  Change in appetite 0 1 -  Feeling bad or failure about yourself  0 1 -  Trouble concentrating 0 0 -  Moving slowly or fidgety/restless 0 0 -  Suicidal thoughts 0 0 -  PHQ-9 Score 2 5 -  Difficult doing work/chores Somewhat difficult Not difficult at all -  Some recent data might be hidden    History Lacey Christensen has a past medical history of Cataract, Hypertension, Narrow angle glaucoma suspect of both eyes, and PUD (peptic ulcer disease).   Lacey Christensen has a past surgical history that includes dilitation and curritage; Colonoscopy (N/A, 10/24/2014); and Esophagogastroduodenoscopy (N/A, 10/24/2014).   Her family history includes Diabetes in her mother; Hypertension in her father and mother.Lacey Christensen reports that Lacey Christensen has been smoking cigarettes. Lacey Christensen has a 25.00 pack-year smoking history. Lacey Christensen has never used smokeless tobacco. Lacey Christensen reports that Lacey Christensen does not drink alcohol. No history on Christensen for drug use.    ROS Review of Systems  Constitutional: Negative.   HENT: Negative.    Eyes:  Negative for visual disturbance.  Respiratory:  Negative for shortness of breath.   Cardiovascular:  Negative for chest pain.  Gastrointestinal:  Negative for abdominal pain.  Genitourinary: Negative.  Negative for dysuria.  Musculoskeletal:  Positive for arthralgias and back pain.   Objective:  BP (!) 144/78   Pulse 86   Temp (!) 97.2 F (36.2 C)   Ht  5' 4.5" (1.638 m)   Wt 145 lb 12.8 oz (66.1 kg)   SpO2 96%   BMI 24.64 kg/m   BP Readings from Last 3 Encounters:  01/27/21 (!) 144/78  09/16/20 (!) 143/79  07/30/20 126/68    Wt Readings from Last 3 Encounters:  01/27/21 145 lb 12.8 oz (66.1 kg)  09/16/20 147 lb 12.8 oz (67 kg)  07/30/20 148 lb 6.4 oz (67.3 kg)     Physical Exam Constitutional:      General: Lacey Christensen is not in acute distress.    Appearance: Lacey Christensen is well-developed.  HENT:     Head: Normocephalic and atraumatic.  Eyes:     Conjunctiva/sclera: Conjunctivae normal.     Pupils: Pupils are equal, round, and reactive to light.  Cardiovascular:     Rate and Rhythm: Normal rate and regular rhythm.     Heart sounds: Normal heart sounds. No murmur heard. Pulmonary:     Effort: Pulmonary effort is normal. No respiratory distress.     Breath sounds: Normal breath sounds. No wheezing or rales.  Abdominal:     General: Bowel sounds are normal. There is no distension.     Palpations: Abdomen is soft.     Tenderness: There is no abdominal tenderness.  Musculoskeletal:        General: Tenderness present.     Cervical back: Normal range of motion and neck supple.  Lumbar back: Spasms and tenderness present. No deformity. Decreased range of motion.  Skin:    General: Skin is warm and dry.  Neurological:     Mental Status: Lacey Christensen is alert and oriented to person, place, and time.     Deep Tendon Reflexes: Reflexes are normal and symmetric.  Psychiatric:        Behavior: Behavior normal.        Thought Content: Thought content normal.      Assessment & Plan:   Lacey Christensen was seen today for back pain, hip pain and knee pain.  Diagnoses and all orders for this visit:  Sciatica of right side -     betamethasone acetate-betamethasone sodium phosphate (CELESTONE) injection 6 mg  Other orders -     predniSONE (STERAPRED UNI-PAK 48 TAB) 10 MG (48) TBPK tablet; Use as directed      I am having Lacey Christensen start on  predniSONE. I am also having her maintain her Calcium Carbonate-Vitamin D (CALCARB 600/D PO), diphenhydrAMINE, latanoprost, Krill Oil, Vitamin D, Aspirin-Acetaminophen-Caffeine (GOODY HEADACHE PO), multivitamin, clotrimazole, raloxifene, esomeprazole, ALPRAZolam, verapamil, and quinapril. We administered betamethasone acetate-betamethasone sodium phosphate.  Allergies as of 01/27/2021       Reactions   Corn-containing Products Itching   Sulfa Antibiotics Other (See Comments)   Childhood reaction-unknown   Timolol Other (See Comments)   depression   Tape Rash        Medication List        Accurate as of January 27, 2021  6:30 PM. If you have any questions, ask your nurse or doctor.          ALPRAZolam 0.25 MG tablet Commonly known as: XANAX TAKE 1/2 TABLET 2 TIMES A DAY AS NEEDED FOR ANXIETY   CALCARB 600/D PO Take 1 tablet by mouth every evening.   clotrimazole 10 MG troche Commonly known as: MYCELEX Allow one to dissolve in the mouth 5 times daily For yeast   diphenhydrAMINE 25 mg capsule Commonly known as: BENADRYL Take 25 mg by mouth every 6 (six) hours as needed for itching or allergies.   esomeprazole 20 MG capsule Commonly known as: NEXIUM Take 1 capsule (20 mg total) by mouth daily.   GOODY HEADACHE PO Take 1 packet by mouth daily as needed for headache.   Krill Oil 300 MG Caps Take 2 capsules (600 mg total) by mouth 2 (two) times daily.   latanoprost 0.005 % ophthalmic solution Commonly known as: XALATAN 1 drop.   multivitamin tablet Take 1 tablet by mouth daily.   predniSONE 10 MG (48) Tbpk tablet Commonly known as: STERAPRED UNI-PAK 48 TAB Use as directed Started by: Claretta Fraise, MD   quinapril 40 MG tablet Commonly known as: ACCUPRIL TAKE 1 TABLET BY MOUTH EVERY DAY   raloxifene 60 MG tablet Commonly known as: Evista Take 1 tablet (60 mg total) by mouth daily. For bone health   verapamil 240 MG CR tablet Commonly known as:  CALAN-SR TAKE 1 TABLET BY MOUTH EVERY DAY   Vitamin D 50 MCG (2000 UT) tablet Take 1 tablet (2,000 Units total) by mouth daily.         Follow-up: Return if symptoms worsen or fail to improve.  Claretta Fraise, M.D.

## 2021-01-30 ENCOUNTER — Other Ambulatory Visit: Payer: Self-pay | Admitting: Family Medicine

## 2021-01-30 ENCOUNTER — Ambulatory Visit (INDEPENDENT_AMBULATORY_CARE_PROVIDER_SITE_OTHER): Payer: Medicare Other

## 2021-01-30 VITALS — Ht 65.0 in | Wt 146.0 lb

## 2021-01-30 DIAGNOSIS — Z0000 Encounter for general adult medical examination without abnormal findings: Secondary | ICD-10-CM

## 2021-01-30 DIAGNOSIS — R198 Other specified symptoms and signs involving the digestive system and abdomen: Secondary | ICD-10-CM | POA: Insufficient documentation

## 2021-01-30 DIAGNOSIS — F411 Generalized anxiety disorder: Secondary | ICD-10-CM

## 2021-01-30 NOTE — Telephone Encounter (Signed)
Pt did not realize at [facility] appt that she was low on this medication, was not happy that I denied it this morning and wrote that it would have to be filled at her appt on 02/09/21. She said that she may not keep the 14th appt if her hip is better. Tried to explain that she has a contract on refills on this type of medication on how this worked and she said she understood this if she abused it or asked for it too early, but just didn't know when she was here that she was about out. Please advise

## 2021-01-30 NOTE — Patient Instructions (Signed)
Ms. Lacey Christensen , Thank you for taking time to come for your Medicare Wellness Visit. I appreciate your ongoing commitment to your health goals. Please review the following plan we discussed and let me know if I can assist you in the future.   Screening recommendations/referrals: Colonoscopy: Done 10/24/2014 - Repeat in 10 years  Mammogram: Done 09/08/2020 - Repeat annually  Bone Density: Done 07/30/2020 - Repeat every 2 years  Recommended yearly ophthalmology/optometry visit for glaucoma screening and checkup Recommended yearly dental visit for hygiene and checkup  Vaccinations: Influenza vaccine: Declined Pneumococcal vaccine: Done 10/16/2012 & 04/15/2015 Tdap vaccine: Due.  Shingles vaccine: Due.   Covid-19: Done 07/18/2019, 08/15/2019, & 04/09/2020  Advanced directives: Please bring a copy of your health care power of attorney and living will to the office to be added to your chart at your convenience.   Conditions/risks identified: Aim for 30 minutes of exercise or brisk walking each day, drink 6-8 glasses of water and eat lots of fruits and vegetables.   Next appointment: Follow up in one year for your annual wellness visit    Preventive Care 65 Years and Older, Female Preventive care refers to lifestyle choices and visits with your health care provider that can promote health and wellness. What does preventive care include? A yearly physical exam. This is also called an annual well check. Dental exams once or twice a year. Routine eye exams. Ask your health care provider how often you should have your eyes checked. Personal lifestyle choices, including: Daily care of your teeth and gums. Regular physical activity. Eating a healthy diet. Avoiding tobacco and drug use. Limiting alcohol use. Practicing safe sex. Taking low-dose aspirin every day. Taking vitamin and mineral supplements as recommended by your health care provider. What happens during an annual well check? The services and  screenings done by your health care provider during your annual well check will depend on your age, overall health, lifestyle risk factors, and family history of disease. Counseling  Your health care provider may ask you questions about your: Alcohol use. Tobacco use. Drug use. Emotional well-being. Home and relationship well-being. Sexual activity. Eating habits. History of falls. Memory and ability to understand (cognition). Work and work Statistician. Reproductive health. Screening  You may have the following tests or measurements: Height, weight, and BMI. Blood pressure. Lipid and cholesterol levels. These may be checked every 5 years, or more frequently if you are over 65 years old. Skin check. Lung cancer screening. You may have this screening every year starting at age 32 if you have a 30-pack-year history of smoking and currently smoke or have quit within the past 15 years. Fecal occult blood test (FOBT) of the stool. You may have this test every year starting at age 48. Flexible sigmoidoscopy or colonoscopy. You may have a sigmoidoscopy every 5 years or a colonoscopy every 10 years starting at age 65. Hepatitis C blood test. Hepatitis B blood test. Sexually transmitted disease (STD) testing. Diabetes screening. This is done by checking your blood sugar (glucose) after you have not eaten for a while (fasting). You may have this done every 1-3 years. Bone density scan. This is done to screen for osteoporosis. You may have this done starting at age 46. Mammogram. This may be done every 1-2 years. Talk to your health care provider about how often you should have regular mammograms. Talk with your health care provider about your test results, treatment options, and if necessary, the need for more tests. Vaccines  Your  health care provider may recommend certain vaccines, such as: Influenza vaccine. This is recommended every year. Tetanus, diphtheria, and acellular pertussis (Tdap,  Td) vaccine. You may need a Td booster every 10 years. Zoster vaccine. You may need this after age 37. Pneumococcal 13-valent conjugate (PCV13) vaccine. One dose is recommended after age 66. Pneumococcal polysaccharide (PPSV23) vaccine. One dose is recommended after age 29. Talk to your health care provider about which screenings and vaccines you need and how often you need them. This information is not intended to replace advice given to you by your health care provider. Make sure you discuss any questions you have with your health care provider. Document Released: 04/11/2015 Document Revised: 12/03/2015 Document Reviewed: 01/14/2015 Elsevier Interactive Patient Education  2017 [employer] Prevention in the Home Falls can cause injuries. They can happen to people of all ages. There are many things you can do to make your home safe and to help prevent falls. What can I do on the outside of my home? Regularly fix the edges of walkways and driveways and fix any cracks. Remove anything that might make you trip as you walk through a door, such as a raised step or threshold. Trim any bushes or trees on the path to your home. Use bright outdoor lighting. Clear any walking paths of anything that might make someone trip, such as rocks or tools. Regularly check to see if handrails are loose or broken. Make sure that both sides of any steps have handrails. Any raised decks and porches should have guardrails on the edges. Have any leaves, snow, or ice cleared regularly. Use sand or salt on walking paths during winter. Clean up any spills in your garage right away. This includes oil or grease spills. What can I do in the bathroom? Use night lights. Install grab bars by the toilet and in the tub and shower. Do not use towel bars as grab bars. Use non-skid mats or decals in the tub or shower. If you need to sit down in the shower, use a plastic, non-slip stool. Keep the floor dry. Clean up any  water that spills on the floor as soon as it happens. Remove soap buildup in the tub or shower regularly. Attach bath mats securely with double-sided non-slip rug tape. Do not have throw rugs and other things on the floor that can make you trip. What can I do in the bedroom? Use night lights. Make sure that you have a light by your bed that is easy to reach. Do not use any sheets or blankets that are too big for your bed. They should not hang down onto the floor. Have a firm chair that has side arms. You can use this for support while you get dressed. Do not have throw rugs and other things on the floor that can make you trip. What can I do in the kitchen? Clean up any spills right away. Avoid walking on wet floors. Keep items that you use a lot in easy-to-reach places. If you need to reach something above you, use a strong step stool that has a grab bar. Keep electrical cords out of the way. Do not use floor polish or wax that makes floors slippery. If you must use wax, use non-skid floor wax. Do not have throw rugs and other things on the floor that can make you trip. What can I do with my stairs? Do not leave any items on the stairs. Make sure that there are handrails  on both sides of the stairs and use them. Fix handrails that are broken or loose. Make sure that handrails are as long as the stairways. Check any carpeting to make sure that it is firmly attached to the stairs. Fix any carpet that is loose or worn. Avoid having throw rugs at the top or bottom of the stairs. If you do have throw rugs, attach them to the floor with carpet tape. Make sure that you have a light switch at the top of the stairs and the bottom of the stairs. If you do not have them, ask someone to add them for you. What else can I do to help prevent falls? Wear shoes that: Do not have high heels. Have rubber bottoms. Are comfortable and fit you well. Are closed at the toe. Do not wear sandals. If you use a  stepladder: Make sure that it is fully opened. Do not climb a closed stepladder. Make sure that both sides of the stepladder are locked into place. Ask someone to hold it for you, if possible. Clearly mark and make sure that you can see: Any grab bars or handrails. First and last steps. Where the edge of each step is. Use tools that help you move around (mobility aids) if they are needed. These include: Canes. Walkers. Scooters. Crutches. Turn on the lights when you go into a dark area. Replace any light bulbs as soon as they burn out. Set up your furniture so you have a clear path. Avoid moving your furniture around. If any of your floors are uneven, fix them. If there are any pets around you, be aware of where they are. Review your medicines with your doctor. Some medicines can make you feel dizzy. This can increase your chance of falling. Ask your doctor what other things that you can do to help prevent falls. This information is not intended to replace advice given to you by your health care provider. Make sure you discuss any questions you have with your health care provider. Document Released: 01/09/2009 Document Revised: 08/21/2015 Document Reviewed: 04/19/2014 Elsevier Interactive Patient Education  2017 [employer].

## 2021-01-30 NOTE — Addendum Note (Signed)
Addended by: Antonietta Barcelona D on: 01/30/2021 11:22 AM   Modules accepted: Orders

## 2021-01-30 NOTE — Progress Notes (Signed)
Subjective:   Lacey Christensen is a 75 y.o. female who presents for Medicare Annual (Subsequent) preventive examination.  Virtual Visit via Telephone Note  I connected with  Lacey Christensen on 01/30/21 at  3:30 PM EDT by telephone and verified that I am speaking with the correct person using two identifiers.  Location: Patient: Home Provider: [facility] Persons participating in the virtual visit: [name]   I discussed the limitations, risks, security and privacy concerns of performing an evaluation and management service by telephone and the availability of in person appointments. The patient expressed understanding and agreed to proceed.  Interactive audio and video telecommunications were attempted between this nurse and patient, however failed, due to patient having technical difficulties OR patient did not have access to video capability.  We continued and completed visit with audio only.  Some vital signs may be absent or patient reported.   Lacey Christensen E Lacey Astorino, LPN   Review of Systems     Cardiac Risk Factors include: advanced age (>14men, >80 women);dyslipidemia;hypertension;smoking/ tobacco exposure     Objective:    Today's Vitals   01/30/21 1535  Weight: 146 lb (66.2 kg)  Height: 5\' 5"  (1.651 m)   Body mass index is 24.3 kg/m.  Advanced Directives 01/30/2021 03/21/2017 03/21/2017 09/23/2014 06/21/2014  Does Patient Have a Medical Advance Directive? Yes Yes Yes No No  Type of Paramedic of [place];Living will Healthcare Power of [place] - -  Does patient want to make changes to medical advance directive? - No - Patient declined - - -  Copy of [place] in Chart? No - copy requested No - copy requested - - -  Would patient like information on creating a medical advance directive? - - - No - patient declined information No - patient declined information    Current Medications  (verified) Outpatient Encounter Medications as of 01/30/2021  Medication Sig   ALPRAZolam (XANAX) 0.25 MG tablet TAKE 1/2 TABLET 2 TIMES A DAY AS NEEDED FOR ANXIETY   Aspirin-Acetaminophen-Caffeine (GOODY HEADACHE PO) Take 1 packet by mouth daily as needed for headache.   Calcium Carbonate-Vitamin D (CALCARB 600/D PO) Take 1 tablet by mouth every evening.   Cholecalciferol (VITAMIN D) 2000 units tablet Take 1 tablet (2,000 Units total) by mouth daily.   clotrimazole (MYCELEX) 10 MG troche Allow one to dissolve in the mouth 5 times daily For yeast   diphenhydrAMINE (BENADRYL) 25 mg capsule Take 25 mg by mouth every 6 (six) hours as needed for itching or allergies.   esomeprazole (NEXIUM) 20 MG capsule Take 1 capsule (20 mg total) by mouth daily.   Krill Oil 300 MG CAPS Take 2 capsules (600 mg total) by mouth 2 (two) times daily.   latanoprost (XALATAN) 0.005 % ophthalmic solution 1 drop.   Multiple Vitamin (MULTIVITAMIN) tablet Take 1 tablet by mouth daily.   predniSONE (STERAPRED UNI-PAK 48 TAB) 10 MG (48) TBPK tablet Use as directed   quinapril (ACCUPRIL) 40 MG tablet TAKE 1 TABLET BY MOUTH EVERY DAY   raloxifene (EVISTA) 60 MG tablet Take 1 tablet (60 mg total) by mouth daily. For bone health   verapamil (CALAN-SR) 240 MG CR tablet TAKE 1 TABLET BY MOUTH EVERY DAY   No facility-administered encounter medications on Christensen as of 01/30/2021.    Allergies (verified) Corn-containing products, Sulfa antibiotics, Timolol, and Tape   History: Past Medical History:  Diagnosis Date   Cataract    Hypertension  Narrow angle glaucoma suspect of both eyes    PUD (peptic ulcer disease)    12 years ago, EGD at [facility] in [place]   Past Surgical History:  Procedure Laterality Date   COLONOSCOPY N/A 10/24/2014   SLF: 1. Angulated rectosigmoid junction 2. Two rectal polyps removed 3. Small internal hemorrhoids    dilitation and curritage     ESOPHAGOGASTRODUODENOSCOPY N/A  10/24/2014   SLF: 1. Schatzki's ring at the gastroesophageal junction 2. Deformed pylous and superficial pyloric ulcer 3. Non-erosive gastritis ( inflammation) was found in the gastric antrum; multiple biopsies were performed. 4. Stricture at the junction of D!/D@   Family History  Problem Relation Age of Onset   Diabetes Mother    Hypertension Mother    Hypertension Father    Colon cancer Neg Hx    Social History   Socioeconomic History   Marital status: Married    Spouse name: Not on Christensen   Number of children: 0   Years of education: Not on Christensen   Highest education level: Not on Christensen  Occupational History   Occupation: retired  Tobacco Use   Smoking status: Every Day    Packs/day: 0.50    Years: 50.00    Pack years: 25.00    Types: Cigarettes   Smokeless tobacco: Never  Substance and Sexual Activity   Alcohol use: No    Alcohol/week: 0.0 standard drinks   Drug use: Not on Christensen   Sexual activity: Yes    Birth control/protection: Post-menopausal  Other Topics Concern   Not on Christensen  Social History Narrative   One level home with her husband   No children, but very close with nieces and nephews   Lots of family nearby   Very active and independent   Social Determinants of Radio broadcast assistant Strain: Low Risk    Difficulty of Paying Living Expenses: Not hard at all  Food Insecurity: No Food Insecurity   Worried About Charity fundraiser in the Last Year: Never true   Arboriculturist in the Last Year: Never true  Transportation Needs: No Transportation Needs   Lack of Transportation (Medical): No   Lack of Transportation (Non-Medical): No  Physical Activity: Sufficiently Active   Days of Exercise per Week: 7 days   Minutes of Exercise per Session: 30 min  Stress: Stress Concern Present   Feeling of Stress : To some extent  Social Connections: Moderately Isolated   Frequency of Communication with Friends and Family: More than three times a week   Frequency  of Social Gatherings with Friends and Family: More than three times a week   Attends Religious Services: Never   Marine scientist or Organizations: No   Attends Music therapist: Never   Marital Status: Married    Tobacco Counseling Ready to quit: Not Answered Counseling given: Not Answered   Clinical Intake:  Pre-visit preparation completed: Yes  Pain : No/denies pain     BMI - recorded: 24.3 Nutritional Status: BMI of 19-24  Normal Nutritional Risks: None Diabetes: No  How often do you need to have someone help you when you read instructions, pamphlets, or other written materials from your doctor or pharmacy?: 1 - Never  Diabetic? no  Interpreter Needed?: No  Information entered by :: Monica Codd, LPN   Activities of Daily Living In your present state of health, do you have any difficulty performing the following activities: 01/30/2021  Hearing? N  Vision? N  Difficulty concentrating or making decisions? N  Walking or climbing stairs? N  Dressing or bathing? N  Doing errands, shopping? N  Preparing Food and eating ? N  Using the Toilet? N  In the past six months, have you accidently leaked urine? Y  Comment mild  Do you have problems with loss of bowel control? N  Managing your Medications? N  Managing your Finances? N  Housekeeping or managing your Housekeeping? N  Some recent data might be hidden    Patient Care Team: Claretta Fraise, MD as PCP - General (Family Medicine) Danie Binder, MD (Inactive) as Consulting Physician (Gastroenterology) Ilean China, RN as Case Manager  Indicate any recent Medical Services you may have received from other than [facility] providers in the past year (date may be approximate).     Assessment:   This is a routine wellness examination for [facility].  Hearing/Vision screen Hearing Screening - Comments:: Denies hearing difficulties  Vision Screening - Comments:: Wears rx glasses prn reading - up to date  with annual eye exams with Dr Edilia Bo [facility] in [place] issues and exercise activities discussed: Current Exercise Habits: Home exercise routine, Type of exercise: walking;Other - see comments (yard work, house work, always busy), Time (Minutes): 30, Frequency (Times/Week): 7, Weekly Exercise (Minutes/Week): 210, Intensity: Mild, Exercise limited by: None identified   Goals Addressed             This Visit's Progress    Reduce fat intake to X grams per day   On track      Depression Screen PHQ 2/9 Scores 01/30/2021 01/27/2021 09/16/2020 09/16/2020 07/30/2020 11/05/2019 05/08/2019  PHQ - 2 Score 2 2 2  0 2 3 0  PHQ- 9 Score 2 2 5  - 7 10 -    Fall Risk Fall Risk  01/30/2021 01/27/2021 09/16/2020 07/30/2020 11/05/2019  Falls in the past year? 0 0 0 0 0  Comment - - - - -  Number falls in past yr: 0 - - - 0  Injury with Fall? 0 - - - 0  Risk for fall due to : Medication side effect - - - No Fall Risks  Follow up Falls prevention discussed - - - Falls evaluation completed    FALL RISK PREVENTION PERTAINING TO THE HOME:  Any stairs in or around the home? Yes  If so, are there any without handrails? No  Home free of loose throw rugs in walkways, pet beds, electrical cords, etc? Yes  Adequate lighting in your home to reduce risk of falls? Yes   ASSISTIVE DEVICES UTILIZED TO PREVENT FALLS:  Life alert? No  Use of a cane, walker or w/c? No  Grab bars in the bathroom? Yes  Shower chair or bench in shower? No  Elevated toilet seat or a handicapped toilet? No   TIMED UP AND GO:  Was the test performed? No . Telephonic visit  Cognitive Function:     6CIT Screen 01/30/2021  What Year? 0 points  What month? 0 points  What time? 0 points  Count back from 20 0 points  Months in reverse 2 points  Repeat phrase 0 points  Total Score 2    Immunizations Immunization History  Administered Date(s) Administered   Influenza-Unspecified 03/04/2011   Moderna  SARS-COV2 Booster Vaccination 04/09/2020   Moderna Sars-Covid-2 Vaccination 07/18/2019, 08/15/2019   Pneumococcal Conjugate-13 04/15/2015   Pneumococcal Polysaccharide-23 10/16/2012    TDAP status:  Due, Education has been provided regarding the importance of this vaccine. Advised may receive this vaccine at local pharmacy or Health Dept. Aware to provide a copy of the vaccination record if obtained from local pharmacy or Health Dept. Verbalized acceptance and understanding.  Flu Vaccine status: Declined, Education has been provided regarding the importance of this vaccine but patient still declined. Advised may receive this vaccine at local pharmacy or Health Dept. Aware to provide a copy of the vaccination record if obtained from local pharmacy or Health Dept. Verbalized acceptance and understanding.  Pneumococcal vaccine status: Up to date  Covid-19 vaccine status: Completed vaccines  Qualifies for Shingles Vaccine? Yes   Zostavax completed No   Shingrix Completed?: No.    Education has been provided regarding the importance of this vaccine. Patient has been advised to call insurance company to determine out of pocket expense if they have not yet received this vaccine. Advised may also receive vaccine at local pharmacy or Health Dept. Verbalized acceptance and understanding.  Screening Tests Health Maintenance  Topic Date Due   Zoster Vaccines- Shingrix (1 of 2) Never done   COVID-19 Vaccine (3 - Moderna risk series) 05/07/2020   INFLUENZA VACCINE  10/27/2020   TETANUS/TDAP  03/24/2022   COLONOSCOPY (Pts 45-30yrs Insurance coverage will need to be confirmed)  10/23/2024   Pneumonia Vaccine 73+ Years old  Completed   DEXA SCAN  Completed   Hepatitis C Screening  Completed   HPV VACCINES  Aged Out    Health Maintenance  Health Maintenance Due  Topic Date Due   Zoster Vaccines- Shingrix (1 of 2) Never done   COVID-19 Vaccine (3 - Moderna risk series) 05/07/2020   INFLUENZA  VACCINE  10/27/2020    Colorectal cancer screening: Type of screening: Colonoscopy. Completed 10/24/2014. Repeat every 10 years  Mammogram status: Completed 09/08/2020. Repeat every year  Bone Density status: Completed 07/30/2020. Results reflect: Bone density results: OSTEOPOROSIS. Repeat every 2 years.  Lung Cancer Screening: (Low Dose CT Chest recommended if Age 54-80 years, 30 pack-year currently smoking OR have quit w/in 15years.) does qualify.   Lung Cancer Screening Referral: had referral earlier this year - declined  Additional Screening:  Hepatitis C Screening: does qualify; Completed 07/05/2017  Vision Screening: Recommended annual ophthalmology exams for early detection of glaucoma and other disorders of the eye. Is the patient up to date with their annual eye exam?  Yes  Who is the provider or what is the name of the office in which the patient attends annual eye exams? [facility] in [place] If pt is not established with a provider, would they like to be referred to a provider to establish care? No .   Dental Screening: Recommended annual dental exams for proper oral hygiene  Community Resource Referral / Chronic Care Management: CRR required this visit?  No   CCM required this visit?  No      Plan:     I have personally reviewed and noted the following in the patient's chart:   Medical and social history Use of alcohol, tobacco or illicit drugs  Current medications and supplements including opioid prescriptions.  Functional ability and status Nutritional status Physical activity Advanced directives List of other physicians Hospitalizations, surgeries, and ER visits in previous 12 months Vitals Screenings to include cognitive, depression, and falls Referrals and appointments  In addition, I have reviewed and discussed with patient certain preventive protocols, quality metrics, and best practice recommendations. A written personalized care plan for preventive  services as well as general preventive health recommendations were provided to patient.     Sandrea Hammond, LPN   04/02/8680   Nurse Notes: none

## 2021-02-01 MED ORDER — ALPRAZOLAM 0.25 MG PO TABS: ORAL_TABLET | ORAL | 1 refills | Status: DC

## 2021-02-02 NOTE — Telephone Encounter (Signed)
Pt aware refill sent to pharmacy 

## 2021-02-09 ENCOUNTER — Ambulatory Visit (INDEPENDENT_AMBULATORY_CARE_PROVIDER_SITE_OTHER): Payer: Medicare Other | Admitting: Family Medicine

## 2021-02-09 ENCOUNTER — Other Ambulatory Visit: Payer: Self-pay

## 2021-02-09 ENCOUNTER — Encounter: Payer: Self-pay | Admitting: Family Medicine

## 2021-02-09 VITALS — BP 136/76 | HR 91 | Temp 97.1°F | Ht 65.0 in | Wt 144.0 lb

## 2021-02-09 DIAGNOSIS — M5431 Sciatica, right side: Secondary | ICD-10-CM

## 2021-02-09 MED ORDER — MELOXICAM 15 MG PO TABS: 15.0000 mg | ORAL_TABLET | Freq: Every day | ORAL | 5 refills | Status: DC

## 2021-02-09 NOTE — Progress Notes (Signed)
Chief Complaint  Patient presents with   Follow-up    HPI  Patient presents today for recheck of sciatica. Mild tightness in right buttocks remains. Mild pain at right knee.   PMH: Smoking status noted ROS: Per HPI  Objective: BP 136/76   Pulse 91   Temp (!) 97.1 F (36.2 C)   Ht 5\' 5"  (1.651 m)   Wt 144 lb (65.3 kg)   SpO2 98%   BMI 23.96 kg/m  Gen: NAD, alert, cooperative with exam HEENT: NCAT Resp: CTABL, no wheezes, non-labored Ext: No edema, warm. FROM minimal tenderness at inner joint line, right knee and at L5 area laterally Neuro: Alert and oriented, No gross deficits  Assessment and plan:  1. Sciatica of right side     Meds ordered this encounter  Medications   meloxicam (MOBIC) 15 MG tablet    Sig: Take 1 tablet (15 mg total) by mouth daily. For joint and muscle pain    Dispense:  30 tablet    Refill:  5    No orders of the defined types were placed in this encounter.   Follow up as needed.  Claretta Fraise, MD

## 2021-03-30 ENCOUNTER — Other Ambulatory Visit: Payer: Self-pay | Admitting: Family Medicine

## 2021-03-30 DIAGNOSIS — I10 Essential (primary) hypertension: Secondary | ICD-10-CM

## 2021-04-07 DIAGNOSIS — Z1152 Encounter for screening for COVID-19: Secondary | ICD-10-CM | POA: Diagnosis not present

## 2021-04-21 ENCOUNTER — Ambulatory Visit (INDEPENDENT_AMBULATORY_CARE_PROVIDER_SITE_OTHER): Payer: Medicare Other | Admitting: Family Medicine

## 2021-04-21 ENCOUNTER — Encounter: Payer: Self-pay | Admitting: Family Medicine

## 2021-04-21 VITALS — BP 126/72 | HR 87 | Temp 97.5°F | Ht 65.0 in | Wt 145.8 lb

## 2021-04-21 DIAGNOSIS — E782 Mixed hyperlipidemia: Secondary | ICD-10-CM | POA: Diagnosis not present

## 2021-04-21 DIAGNOSIS — F17200 Nicotine dependence, unspecified, uncomplicated: Secondary | ICD-10-CM

## 2021-04-21 DIAGNOSIS — Z0000 Encounter for general adult medical examination without abnormal findings: Secondary | ICD-10-CM

## 2021-04-21 DIAGNOSIS — I10 Essential (primary) hypertension: Secondary | ICD-10-CM | POA: Diagnosis not present

## 2021-04-21 DIAGNOSIS — Z79899 Other long term (current) drug therapy: Secondary | ICD-10-CM | POA: Diagnosis not present

## 2021-04-21 DIAGNOSIS — E559 Vitamin D deficiency, unspecified: Secondary | ICD-10-CM | POA: Diagnosis not present

## 2021-04-21 DIAGNOSIS — F411 Generalized anxiety disorder: Secondary | ICD-10-CM

## 2021-04-21 MED ORDER — IRBESARTAN 300 MG PO TABS: 300.0000 mg | ORAL_TABLET | Freq: Every day | ORAL | 1 refills | Status: DC

## 2021-04-21 MED ORDER — ALPRAZOLAM 0.25 MG PO TABS: ORAL_TABLET | ORAL | 1 refills | Status: DC

## 2021-04-21 NOTE — Progress Notes (Signed)
Subjective:  Patient ID: Lacey Christensen, female    DOB: 23-Aug-1945  Age: 76 y.o. MRN: [id number]  CC: Annual Exam   HPI Lacey Christensen presents for annual physical examination.   Follow-up of hypertension. Patient has no history of headache chest pain or shortness of breath or recent cough. Patient also denies symptoms of TIA such as numbness weakness lateralizing. Patient checks  blood pressure at home and has not had any elevated readings recently. Patient denies side effects from his medication. States taking it regularly.  Patient in for follow-up of elevated cholesterol. Doing well without complaints on current medication. Denies side effects of statin including myalgia and arthralgia and nausea. Also in today for liver function testing. Currently no chest pain, shortness of breath or other cardiovascular related symptoms noted.   Depression screen [facility] 2/9 04/21/2021 04/21/2021 02/09/2021  Decreased Interest 1 0 1  Down, Depressed, Hopeless 1 0 1  PHQ - 2 Score 2 0 2  Altered sleeping 0 - 0  Tired, decreased energy 0 - 0  Change in appetite 0 - 0  Feeling bad or failure about yourself  0 - 0  Trouble concentrating 0 - 1  Moving slowly or fidgety/restless 0 - 0  Suicidal thoughts 0 - 0  PHQ-9 Score 2 - 3  Difficult doing work/chores Not difficult at all - Not difficult at all  Some recent data might be hidden    History Don has a past medical history of Cataract, Hypertension, Narrow angle glaucoma suspect of both eyes, and PUD (peptic ulcer disease).   She has a past surgical history that includes dilitation and curritage; Colonoscopy (N/A, 10/24/2014); and Esophagogastroduodenoscopy (N/A, 10/24/2014).   Her family history includes Diabetes in her mother; Hypertension in her father and mother.She reports that she has been smoking cigarettes. She has a 25.00 pack-year smoking history. She has never used smokeless tobacco. She reports that she does not drink alcohol. No history on  file for drug use.    ROS Review of Systems  Constitutional: Negative.  Negative for appetite change, chills, diaphoresis, fatigue, fever and unexpected weight change.  HENT:  Negative for congestion, ear pain, hearing loss, postnasal drip, rhinorrhea, sneezing, sore throat and trouble swallowing.   Eyes:  Negative for pain and visual disturbance.  Respiratory:  Negative for cough, chest tightness and shortness of breath.   Cardiovascular:  Negative for chest pain and palpitations.  Gastrointestinal:  Negative for abdominal pain, constipation, diarrhea, nausea and vomiting.  Endocrine: Negative for cold intolerance, heat intolerance, polydipsia, polyphagia and polyuria.  Genitourinary:  Negative for difficulty urinating, dysuria, frequency and menstrual problem.  Musculoskeletal:  Positive for arthralgias (right hip - leg exercises help). Negative for joint swelling and myalgias.  Skin:  Negative for rash.  Allergic/Immunologic: Negative for environmental allergies.  Neurological:  Negative for dizziness, weakness, numbness and headaches.  Psychiatric/Behavioral:  Negative for agitation, dysphoric mood and sleep disturbance.    Objective:  BP 126/72    Pulse 87    Temp (!) 97.5 F (36.4 C)    Ht 5' 5"  (1.651 m)    Wt 145 lb 12.8 oz (66.1 kg)    SpO2 98%    BMI 24.26 kg/m   BP Readings from Last 3 Encounters:  04/21/21 126/72  02/09/21 136/76  01/27/21 (!) 144/78    Wt Readings from Last 3 Encounters:  04/21/21 145 lb 12.8 oz (66.1 kg)  02/09/21 144 lb (65.3 kg)  01/30/21 146 lb (66.2 kg)  Physical Exam Constitutional:      General: She is not in acute distress.    Appearance: She is well-developed.  HENT:     Head: Normocephalic and atraumatic.  Eyes:     Conjunctiva/sclera: Conjunctivae normal.     Pupils: Pupils are equal, round, and reactive to light.  Neck:     Thyroid: No thyromegaly.  Cardiovascular:     Rate and Rhythm: Normal rate and regular rhythm.      Heart sounds: Normal heart sounds. No murmur heard. Pulmonary:     Effort: Pulmonary effort is normal. No respiratory distress.     Breath sounds: Normal breath sounds. No wheezing or rales.  Abdominal:     General: Bowel sounds are normal. There is no distension.     Palpations: Abdomen is soft.     Tenderness: There is no abdominal tenderness.  Musculoskeletal:        General: Normal range of motion.     Cervical back: Normal range of motion and neck supple.  Lymphadenopathy:     Cervical: No cervical adenopathy.  Skin:    General: Skin is warm and dry.  Neurological:     Mental Status: She is alert and oriented to person, place, and time.  Psychiatric:        Behavior: Behavior normal.        Thought Content: Thought content normal.        Judgment: Judgment normal.      Assessment & Plan:   Lacey Christensen was seen today for annual exam.  Diagnoses and all orders for this visit:  Well adult exam -     TSH  Essential hypertension -     CBC with Differential/Platelet -     CMP14+EGFR -     TSH  Mixed hyperlipidemia -     Lipid panel -     TSH  Controlled substance agreement signed -     ToxASSURE Select 13 (MW), Urine  Generalized anxiety disorder -     ALPRAZolam (XANAX) 0.25 MG tablet; TAKE 1/2 TABLET 3 TIMES A DAY AS NEEDED FOR ANXIETY  Current every day smoker  Vitamin D deficiency -     VITAMIN D 25 Hydroxy (Vit-D Deficiency, Fractures)  Other orders -     irbesartan (AVAPRO) 300 MG tablet; Take 1 tablet (300 mg total) by mouth daily.       I have discontinued Karen Lemmerman's raloxifene, predniSONE, meloxicam, and quinapril. I have also changed her ALPRAZolam. Additionally, I am having her start on irbesartan. Lastly, I am having her maintain her Calcium Carbonate-Vitamin D (CALCARB 600/D PO), diphenhydrAMINE, latanoprost, Krill Oil, Vitamin D, Aspirin-Acetaminophen-Caffeine (GOODY HEADACHE PO), multivitamin, esomeprazole, and verapamil.  Allergies as of  04/21/2021       Reactions   Corn-containing Products Itching   Meloxicam Diarrhea   Sulfa Antibiotics Other (See Comments)   Childhood reaction-unknown   Timolol Other (See Comments)   depression   Tape Rash        Medication List        Accurate as of April 21, 2021  5:25 PM. If you have any questions, ask your nurse or doctor.          STOP taking these medications    meloxicam 15 MG tablet Commonly known as: MOBIC Stopped by: Claretta Fraise, MD   predniSONE 10 MG (48) Tbpk tablet Commonly known as: STERAPRED UNI-PAK 48 TAB Stopped by: Claretta Fraise, MD   quinapril 40 MG tablet Commonly  known as: ACCUPRIL Stopped by: Claretta Fraise, MD   raloxifene 60 MG tablet Commonly known as: Evista Stopped by: Claretta Fraise, MD       TAKE these medications    ALPRAZolam 0.25 MG tablet Commonly known as: XANAX TAKE 1/2 TABLET 3 TIMES A DAY AS NEEDED FOR ANXIETY What changed: additional instructions Changed by: Claretta Fraise, MD   CALCARB 600/D PO Take 1 tablet by mouth every evening.   diphenhydrAMINE 25 mg capsule Commonly known as: BENADRYL Take 25 mg by mouth every 6 (six) hours as needed for itching or allergies.   esomeprazole 20 MG capsule Commonly known as: NEXIUM Take 1 capsule (20 mg total) by mouth daily.   GOODY HEADACHE PO Take 1 packet by mouth daily as needed for headache.   irbesartan 300 MG tablet Commonly known as: AVAPRO Take 1 tablet (300 mg total) by mouth daily. Started by: Claretta Fraise, MD   Krill Oil 300 MG Caps Take 2 capsules (600 mg total) by mouth 2 (two) times daily.   latanoprost 0.005 % ophthalmic solution Commonly known as: XALATAN 1 drop.   multivitamin tablet Take 1 tablet by mouth daily.   verapamil 240 MG CR tablet Commonly known as: CALAN-SR TAKE 1 TABLET BY MOUTH EVERY DAY   Vitamin D 50 MCG (2000 UT) tablet Take 1 tablet (2,000 Units total) by mouth daily.         Follow-up: Return in about 6  months (around 10/19/2021).  Claretta Fraise, M.D.

## 2021-04-22 LAB — CBC WITH DIFFERENTIAL/PLATELET
Basophils Absolute: 0.2 10*3/uL (ref 0.0–0.2)
Basos: 1 %
EOS (ABSOLUTE): 0.6 10*3/uL — ABNORMAL HIGH (ref 0.0–0.4)
Eos: 5 %
Hematocrit: 41.1 % (ref 34.0–46.6)
Hemoglobin: 14.1 g/dL (ref 11.1–15.9)
Immature Grans (Abs): 0 10*3/uL (ref 0.0–0.1)
Immature Granulocytes: 0 %
Lymphocytes Absolute: 4.2 10*3/uL — ABNORMAL HIGH (ref 0.7–3.1)
Lymphs: 35 %
MCH: 32 pg (ref 26.6–33.0)
MCHC: 34.3 g/dL (ref 31.5–35.7)
MCV: 93 fL (ref 79–97)
Monocytes Absolute: 1 10*3/uL — ABNORMAL HIGH (ref 0.1–0.9)
Monocytes: 8 %
Neutrophils Absolute: 6.1 10*3/uL (ref 1.4–7.0)
Neutrophils: 51 %
Platelets: 473 10*3/uL — ABNORMAL HIGH (ref 150–450)
RBC: 4.41 x10E6/uL (ref 3.77–5.28)
RDW: 13 % (ref 11.7–15.4)
WBC: 12.1 10*3/uL — ABNORMAL HIGH (ref 3.4–10.8)

## 2021-04-22 LAB — LIPID PANEL
Chol/HDL Ratio: 5.8 ratio — ABNORMAL HIGH (ref 0.0–4.4)
Cholesterol, Total: 222 mg/dL — ABNORMAL HIGH (ref 100–199)
HDL: 38 mg/dL — ABNORMAL LOW (ref >39)
LDL Chol Calc (NIH): 136 mg/dL — ABNORMAL HIGH (ref 0–99)
Triglycerides: 268 mg/dL — ABNORMAL HIGH (ref 0–149)
VLDL Cholesterol Cal: 48 mg/dL — ABNORMAL HIGH (ref 5–40)

## 2021-04-22 LAB — CMP14+EGFR
ALT: 26 IU/L (ref 0–32)
AST: 25 IU/L (ref 0–40)
Albumin/Globulin Ratio: 1.7 (ref 1.2–2.2)
Albumin: 4.4 g/dL (ref 3.7–4.7)
Alkaline Phosphatase: 80 IU/L (ref 44–121)
BUN/Creatinine Ratio: 18 (ref 12–28)
BUN: 15 mg/dL (ref 8–27)
Bilirubin Total: <0.2 mg/dL (ref 0.0–1.2)
CO2: 26 mmol/L (ref 20–29)
Calcium: 10.7 mg/dL — ABNORMAL HIGH (ref 8.7–10.3)
Chloride: 104 mmol/L (ref 96–106)
Creatinine, Ser: 0.85 mg/dL (ref 0.57–1.00)
Globulin, Total: 2.6 g/dL (ref 1.5–4.5)
Glucose: 86 mg/dL (ref 70–99)
Potassium: 5.2 mmol/L (ref 3.5–5.2)
Sodium: 141 mmol/L (ref 134–144)
Total Protein: 7 g/dL (ref 6.0–8.5)
eGFR: 71 mL/min/{1.73_m2} (ref >59)

## 2021-04-23 DIAGNOSIS — H401113 Primary open-angle glaucoma, right eye, severe stage: Secondary | ICD-10-CM | POA: Diagnosis not present

## 2021-04-23 DIAGNOSIS — H401121 Primary open-angle glaucoma, left eye, mild stage: Secondary | ICD-10-CM | POA: Diagnosis not present

## 2021-04-26 LAB — TOXASSURE SELECT 13 (MW), URINE

## 2021-04-27 NOTE — Addendum Note (Signed)
Addended by: Claretta Fraise on: 04/27/2021 12:43 PM   Modules accepted: Level of Service

## 2021-05-04 DIAGNOSIS — H401113 Primary open-angle glaucoma, right eye, severe stage: Secondary | ICD-10-CM | POA: Diagnosis not present

## 2021-05-04 DIAGNOSIS — H348312 Tributary (branch) retinal vein occlusion, right eye, stable: Secondary | ICD-10-CM | POA: Diagnosis not present

## 2021-05-04 DIAGNOSIS — H401121 Primary open-angle glaucoma, left eye, mild stage: Secondary | ICD-10-CM | POA: Diagnosis not present

## 2021-05-14 DIAGNOSIS — H34831 Tributary (branch) retinal vein occlusion, right eye, with macular edema: Secondary | ICD-10-CM | POA: Diagnosis not present

## 2021-05-14 DIAGNOSIS — H401121 Primary open-angle glaucoma, left eye, mild stage: Secondary | ICD-10-CM | POA: Diagnosis not present

## 2021-05-14 DIAGNOSIS — H401113 Primary open-angle glaucoma, right eye, severe stage: Secondary | ICD-10-CM | POA: Diagnosis not present

## 2021-05-14 DIAGNOSIS — H26493 Other secondary cataract, bilateral: Secondary | ICD-10-CM | POA: Diagnosis not present

## 2021-05-14 DIAGNOSIS — Z961 Presence of intraocular lens: Secondary | ICD-10-CM | POA: Diagnosis not present

## 2021-06-11 DIAGNOSIS — Z961 Presence of intraocular lens: Secondary | ICD-10-CM | POA: Diagnosis not present

## 2021-06-11 DIAGNOSIS — H34831 Tributary (branch) retinal vein occlusion, right eye, with macular edema: Secondary | ICD-10-CM | POA: Diagnosis not present

## 2021-06-11 DIAGNOSIS — H26493 Other secondary cataract, bilateral: Secondary | ICD-10-CM | POA: Diagnosis not present

## 2021-06-11 DIAGNOSIS — H401121 Primary open-angle glaucoma, left eye, mild stage: Secondary | ICD-10-CM | POA: Diagnosis not present

## 2021-06-11 DIAGNOSIS — H401113 Primary open-angle glaucoma, right eye, severe stage: Secondary | ICD-10-CM | POA: Diagnosis not present

## 2021-07-23 ENCOUNTER — Telehealth: Payer: Self-pay | Admitting: Family Medicine

## 2021-07-23 ENCOUNTER — Other Ambulatory Visit: Payer: Self-pay | Admitting: *Deleted

## 2021-07-23 DIAGNOSIS — E782 Mixed hyperlipidemia: Secondary | ICD-10-CM

## 2021-07-23 DIAGNOSIS — I10 Essential (primary) hypertension: Secondary | ICD-10-CM

## 2021-07-23 NOTE — Telephone Encounter (Signed)
LABS ORDERED.

## 2021-07-30 DIAGNOSIS — H3589 Other specified retinal disorders: Secondary | ICD-10-CM | POA: Diagnosis not present

## 2021-07-30 DIAGNOSIS — H348312 Tributary (branch) retinal vein occlusion, right eye, stable: Secondary | ICD-10-CM | POA: Diagnosis not present

## 2021-08-01 ENCOUNTER — Other Ambulatory Visit: Payer: Self-pay | Admitting: Family Medicine

## 2021-08-01 DIAGNOSIS — F411 Generalized anxiety disorder: Secondary | ICD-10-CM

## 2021-08-03 NOTE — Telephone Encounter (Signed)
Controlled - ntbs  ?

## 2021-08-03 NOTE — Telephone Encounter (Signed)
Appt 5-9 with Dr. Livia Snellen ?

## 2021-08-04 ENCOUNTER — Encounter: Payer: Self-pay | Admitting: Family Medicine

## 2021-08-04 ENCOUNTER — Ambulatory Visit (INDEPENDENT_AMBULATORY_CARE_PROVIDER_SITE_OTHER): Payer: Medicare Other | Admitting: Family Medicine

## 2021-08-04 VITALS — BP 129/72 | HR 72 | Temp 98.1°F | Ht 65.0 in | Wt 146.0 lb

## 2021-08-04 DIAGNOSIS — K219 Gastro-esophageal reflux disease without esophagitis: Secondary | ICD-10-CM

## 2021-08-04 DIAGNOSIS — F411 Generalized anxiety disorder: Secondary | ICD-10-CM

## 2021-08-04 DIAGNOSIS — I10 Essential (primary) hypertension: Secondary | ICD-10-CM

## 2021-08-04 MED ORDER — ALPRAZOLAM 0.25 MG PO TABS: ORAL_TABLET | ORAL | 1 refills | Status: DC

## 2021-08-04 NOTE — Progress Notes (Signed)
? ?Subjective:  ?Patient ID: Lacey Christensen, female    DOB: 21-Aug-1945  Age: 76 y.o. MRN: [id number] ? ?CC: Medical Management of Chronic Issues ? ? ?HPI ?Lacey Christensen presents for chronic anxiety. Stable currently. Cut back some on the xanaax. Wants to consider tapering off. Will consider seroquel.  ? ? presents for  follow-up of hypertension. Patient has no history of headache chest pain or shortness of breath or recent cough. Patient also denies symptoms of TIA such as focal numbness or weakness. Patient denies side effects from medication. States taking it regularly. ? ? ? ?  08/04/2021  ?  1:37 PM 08/04/2021  ?  1:30 PM 04/21/2021  ?  2:16 PM  ?Depression screen PHQ 2/9  ?Decreased Interest 1 0 1  ?Down, Depressed, Hopeless 1 0 1  ?PHQ - 2 Score 2 0 2  ?Altered sleeping 0  0  ?Tired, decreased energy 1  0  ?Change in appetite 0  0  ?Feeling bad or failure about yourself  0  0  ?Trouble concentrating 0  0  ?Moving slowly or fidgety/restless 0  0  ?Suicidal thoughts 0  0  ?PHQ-9 Score 3  2  ?Difficult doing work/chores Somewhat difficult  Not difficult at all  ? ? ?  08/04/2021  ?  1:38 PM 04/21/2021  ?  2:17 PM 02/09/2021  ?  9:42 AM 01/27/2021  ?  8:12 AM  ?GAD 7 : Generalized Anxiety Score  ?Nervous, Anxious, on Edge '1 1 2 1  '$ ?Control/stop worrying 0 0 0 1  ?Worry too much - different things 0 0 0 0  ?Trouble relaxing 0 0 2 0  ?Restless '1 1 1 1  '$ ?Easily annoyed or irritable 1 0 2 0  ?Afraid - awful might happen 0 0 0 0  ?Total GAD 7 Score '3 2 7 3  '$ ?Anxiety Difficulty Somewhat difficult Not difficult at all Somewhat difficult Not difficult at all  ? ? ? ?History ?Lacey Christensen has a past medical history of Cataract, Hypertension, Narrow angle glaucoma suspect of both eyes, and PUD (peptic ulcer disease).  ? ?She has a past surgical history that includes dilitation and curritage; Colonoscopy (N/A, 10/24/2014); and Esophagogastroduodenoscopy (N/A, 10/24/2014).  ? ?Her family history includes Diabetes in her mother; Hypertension in her  father and mother.She reports that she has been smoking cigarettes. She has a 25.00 pack-year smoking history. She has never used smokeless tobacco. She reports that she does not drink alcohol. No history on file for drug use. ? ? ? ?ROS ?Review of Systems  ?Constitutional: Negative.   ?HENT: Negative.    ?Eyes:  Negative for visual disturbance.  ?Respiratory:  Negative for shortness of breath.   ?Cardiovascular:  Negative for chest pain.  ?Gastrointestinal:  Negative for abdominal pain.  ?Musculoskeletal:  Negative for arthralgias.  ? ?Objective:  ?BP 129/72   Pulse 72   Temp 98.1 ?F (36.7 ?C)   Ht '5\' 5"'$  (1.651 m)   Wt 146 lb (66.2 kg)   SpO2 96%   BMI 24.30 kg/m?  ? ?BP Readings from Last 3 Encounters:  ?08/04/21 129/72  ?04/21/21 126/72  ?02/09/21 136/76  ? ? ?Wt Readings from Last 3 Encounters:  ?08/04/21 146 lb (66.2 kg)  ?04/21/21 145 lb 12.8 oz (66.1 kg)  ?02/09/21 144 lb (65.3 kg)  ? ? ? ?Physical Exam ?Constitutional:   ?   General: She is not in acute distress. ?   Appearance: She is well-developed.  ?Cardiovascular:  ?  Rate and Rhythm: Normal rate and regular rhythm.  ?Pulmonary:  ?   Breath sounds: Normal breath sounds.  ?Musculoskeletal:     ?   General: Normal range of motion.  ?Skin: ?   General: Skin is warm and dry.  ?Neurological:  ?   Mental Status: She is alert and oriented to person, place, and time.  ? ? ? ? ?Assessment & Plan:  ? ?Jessia was seen today for medical management of chronic issues. ? ?Diagnoses and all orders for this visit: ? ?Gastroesophageal reflux disease, unspecified whether esophagitis present ? ?Generalized anxiety disorder ?-     ALPRAZolam (XANAX) 0.25 MG tablet; TAKE 1/2 TABLET 3 TIMES A DAY AS NEEDED FOR ANXIETY ? ?Essential hypertension ? ? ? ? ? ? ?I am having [name] maintain her Calcium Carbonate-Vitamin D (CALCARB 600/D PO), diphenhydrAMINE, latanoprost, Krill Oil, Vitamin D, Aspirin-Acetaminophen-Caffeine (GOODY HEADACHE PO), multivitamin, esomeprazole,  verapamil, irbesartan, and ALPRAZolam. ? ?Allergies as of 08/04/2021   ? ?   Reactions  ? Corn-containing Products Itching  ? Meloxicam Diarrhea  ? Sulfa Antibiotics Other (See Comments)  ? Childhood reaction-unknown  ? Timolol Other (See Comments)  ? depression  ? Tape Rash  ? ?  ? ?  ?Medication List  ?  ? ?  ? Accurate as of Aug 04, 2021  2:04 PM. If you have any questions, ask your nurse or doctor.  ?  ?  ? ?  ? ?ALPRAZolam 0.25 MG tablet ?Commonly known as: Duanne Moron ?TAKE 1/2 TABLET 3 TIMES A DAY AS NEEDED FOR ANXIETY ?  ?CALCARB 600/D PO ?Take 1 tablet by mouth every evening. ?  ?diphenhydrAMINE 25 mg capsule ?Commonly known as: BENADRYL ?Take 25 mg by mouth every 6 (six) hours as needed for itching or allergies. ?  ?esomeprazole 20 MG capsule ?Commonly known as: [place] ?Take 1 capsule (20 mg total) by mouth daily. ?  ?GOODY HEADACHE PO ?Take 1 packet by mouth daily as needed for headache. ?  ?irbesartan 300 MG tablet ?Commonly known as: AVAPRO ?Take 1 tablet (300 mg total) by mouth daily. ?  ?Krill Oil 300 MG Caps ?Take 2 capsules (600 mg total) by mouth 2 (two) times daily. ?  ?latanoprost 0.005 % ophthalmic solution ?Commonly known as: XALATAN ?1 drop. ?  ?multivitamin tablet ?Take 1 tablet by mouth daily. ?  ?verapamil 240 MG CR tablet ?Commonly known as: CALAN-SR ?TAKE 1 TABLET BY MOUTH EVERY DAY ?  ?Vitamin D 50 MCG (2000 UT) tablet ?Take 1 tablet (2,000 Units total) by mouth daily. ?  ? ?  ? ? ? ?Follow-up: No follow-ups on file. ? ?Claretta Fraise, M.D. ?

## 2021-08-10 ENCOUNTER — Telehealth: Payer: Self-pay | Admitting: Family Medicine

## 2021-08-10 NOTE — Telephone Encounter (Signed)
Since she takes 1/2 of the .25, I can't recommend changing to the 0.5. Have her check with other pharmacies ?

## 2021-08-10 NOTE — Telephone Encounter (Signed)
Pt called to let Dr Livia Snellen know that she cant get her [facility]anax refilled at [facility] because they are currently out of stock with the 0.'25mg'$  and so is [facility] in [place]. ? ?Says [facility] has the 0.'5mg'$  Xanax in stock if Dr Livia Snellen wants to change her Rx to 0.'5mg'$ .  ? ?Unsure if any other pharmacies in town have Xanax 0.'25mg'$  in stock.  ?

## 2021-08-10 NOTE — Telephone Encounter (Signed)
Called left message to call back 

## 2021-08-11 ENCOUNTER — Other Ambulatory Visit: Payer: Self-pay | Admitting: Family Medicine

## 2021-08-11 DIAGNOSIS — F411 Generalized anxiety disorder: Secondary | ICD-10-CM

## 2021-08-11 MED ORDER — ALPRAZOLAM 0.25 MG PO TABS: ORAL_TABLET | ORAL | 1 refills | Status: DC

## 2021-08-11 NOTE — Telephone Encounter (Signed)
Please let the patient know that I sent their prescription to their pharmacy. Thanks, WS 

## 2021-08-11 NOTE — Telephone Encounter (Signed)
Pt informed that Xanax has been sent to [facility] per Dr. Livia Snellen. She has no concerns. ?

## 2021-08-11 NOTE — Telephone Encounter (Signed)
Patient calling back to let us know that [facility] has the dosage that she needs. [facility] due to this being a new prescription. Please send to [facility].   ?

## 2021-09-24 DIAGNOSIS — H348312 Tributary (branch) retinal vein occlusion, right eye, stable: Secondary | ICD-10-CM | POA: Diagnosis not present

## 2021-09-24 DIAGNOSIS — Z961 Presence of intraocular lens: Secondary | ICD-10-CM | POA: Diagnosis not present

## 2021-09-24 DIAGNOSIS — H26493 Other secondary cataract, bilateral: Secondary | ICD-10-CM | POA: Diagnosis not present

## 2021-09-24 DIAGNOSIS — H401121 Primary open-angle glaucoma, left eye, mild stage: Secondary | ICD-10-CM | POA: Diagnosis not present

## 2021-09-24 DIAGNOSIS — H401113 Primary open-angle glaucoma, right eye, severe stage: Secondary | ICD-10-CM | POA: Diagnosis not present

## 2021-10-12 ENCOUNTER — Ambulatory Visit: Payer: Self-pay | Admitting: *Deleted

## 2021-10-12 NOTE — Chronic Care Management (AMB) (Signed)
  Chronic Care Management   Note  10/12/2021 Name: Lacey Christensen MRN: [id number] DOB: 1946-03-05   Patient has either met RN Care Management goals, is stable from RN Care Management perspective, or has not recently engaged with the RN Care Manager. I am removing RN Care Manager from Care Team and closing [facility]. If patient is currently engaged with another CCM team member I will forward this encounter to inform them of my case closure. Patient may be eligible for re-engagement with RN Care Manager in the future if necessary and can discuss this with their PCP.  Chong Sicilian, BSN, RN-BC Embedded Chronic Care Manager [facility] [place] Management Direct Dial: [phone]

## 2021-10-12 NOTE — Patient Instructions (Signed)
[name]  I have previously worked with you through the Chronic Care Management Program at [facility]. Due to program changes I am removing myself from your care team because you've either met our goals, your conditions are stable and no longer require care management, or we haven't engaged within the past 6 months. If you are currently active with another CCM Team Member, you will remain active with them unless they reach out to you with additional information. If you feel that you need RN Care Management services in the future, please talk with your primary care provider to discuss re-engagement with the RN Care Manager that will be assigned to [facility]. This does not affect your status as a patient at [facility].   Thank you for allowing me to participate in your your healthcare journey.  Chong Sicilian, BSN, RN-BC Embedded Chronic Care Manager [facility] [place] Management Direct Dial: [phone]

## 2021-10-15 ENCOUNTER — Other Ambulatory Visit: Payer: Self-pay | Admitting: Family Medicine

## 2021-10-16 ENCOUNTER — Other Ambulatory Visit: Payer: Medicare Other

## 2021-10-16 DIAGNOSIS — I10 Essential (primary) hypertension: Secondary | ICD-10-CM

## 2021-10-16 DIAGNOSIS — E782 Mixed hyperlipidemia: Secondary | ICD-10-CM

## 2021-10-17 LAB — LIPID PANEL
Chol/HDL Ratio: 5.8 ratio — ABNORMAL HIGH (ref 0.0–4.4)
Cholesterol, Total: 219 mg/dL — ABNORMAL HIGH (ref 100–199)
HDL: 38 mg/dL — ABNORMAL LOW (ref >39)
LDL Chol Calc (NIH): 129 mg/dL — ABNORMAL HIGH (ref 0–99)
Triglycerides: 289 mg/dL — ABNORMAL HIGH (ref 0–149)
VLDL Cholesterol Cal: 52 mg/dL — ABNORMAL HIGH (ref 5–40)

## 2021-10-17 LAB — CBC WITH DIFFERENTIAL/PLATELET
Basophils Absolute: 0.1 10*3/uL (ref 0.0–0.2)
Basos: 1 %
EOS (ABSOLUTE): 0.6 10*3/uL — ABNORMAL HIGH (ref 0.0–0.4)
Eos: 7 %
Hematocrit: 41.3 % (ref 34.0–46.6)
Hemoglobin: 13.6 g/dL (ref 11.1–15.9)
Immature Grans (Abs): 0 10*3/uL (ref 0.0–0.1)
Immature Granulocytes: 0 %
Lymphocytes Absolute: 2.5 10*3/uL (ref 0.7–3.1)
Lymphs: 31 %
MCH: 31.3 pg (ref 26.6–33.0)
MCHC: 32.9 g/dL (ref 31.5–35.7)
MCV: 95 fL (ref 79–97)
Monocytes Absolute: 0.7 10*3/uL (ref 0.1–0.9)
Monocytes: 9 %
Neutrophils Absolute: 4.2 10*3/uL (ref 1.4–7.0)
Neutrophils: 52 %
Platelets: 469 10*3/uL — ABNORMAL HIGH (ref 150–450)
RBC: 4.34 x10E6/uL (ref 3.77–5.28)
RDW: 13.1 % (ref 11.7–15.4)
WBC: 8.1 10*3/uL (ref 3.4–10.8)

## 2021-10-17 LAB — CMP14+EGFR
ALT: 24 IU/L (ref 0–32)
AST: 20 IU/L (ref 0–40)
Albumin/Globulin Ratio: 1.6 (ref 1.2–2.2)
Albumin: 4.4 g/dL (ref 3.8–4.8)
Alkaline Phosphatase: 83 IU/L (ref 44–121)
BUN/Creatinine Ratio: 12 (ref 12–28)
BUN: 10 mg/dL (ref 8–27)
Bilirubin Total: 0.3 mg/dL (ref 0.0–1.2)
CO2: 22 mmol/L (ref 20–29)
Calcium: 10.5 mg/dL — ABNORMAL HIGH (ref 8.7–10.3)
Chloride: 104 mmol/L (ref 96–106)
Creatinine, Ser: 0.84 mg/dL (ref 0.57–1.00)
Globulin, Total: 2.7 g/dL (ref 1.5–4.5)
Glucose: 108 mg/dL — ABNORMAL HIGH (ref 70–99)
Potassium: 5.2 mmol/L (ref 3.5–5.2)
Sodium: 143 mmol/L (ref 134–144)
Total Protein: 7.1 g/dL (ref 6.0–8.5)
eGFR: 72 mL/min/{1.73_m2} (ref >59)

## 2021-10-22 ENCOUNTER — Ambulatory Visit (INDEPENDENT_AMBULATORY_CARE_PROVIDER_SITE_OTHER): Payer: Medicare Other | Admitting: Family Medicine

## 2021-10-22 ENCOUNTER — Encounter: Payer: Self-pay | Admitting: Family Medicine

## 2021-10-22 VITALS — BP 118/72 | HR 78 | Temp 98.6°F | Ht 65.0 in | Wt 145.6 lb

## 2021-10-22 DIAGNOSIS — F411 Generalized anxiety disorder: Secondary | ICD-10-CM | POA: Diagnosis not present

## 2021-10-22 DIAGNOSIS — K219 Gastro-esophageal reflux disease without esophagitis: Secondary | ICD-10-CM

## 2021-10-22 DIAGNOSIS — I10 Essential (primary) hypertension: Secondary | ICD-10-CM

## 2021-10-22 MED ORDER — IRBESARTAN 150 MG PO TABS: 150.0000 mg | ORAL_TABLET | Freq: Every day | ORAL | 3 refills | Status: DC

## 2021-10-22 MED ORDER — ALPRAZOLAM 0.25 MG PO TABS: ORAL_TABLET | ORAL | 1 refills | Status: DC

## 2021-10-22 NOTE — Progress Notes (Signed)
Subjective:  Patient ID: Lacey Christensen, female    DOB: Apr 14, 1945  Age: 76 y.o. MRN: [id number]  CC: Medical Management of Chronic Issues   HPI Lacey Christensen presents for  presents for  follow-up of hypertension. Patient has no history of headache chest pain or shortness of breath or recent cough. Patient also denies symptoms of TIA such as focal numbness or weakness. Patient denies side effects from medication. States taking it regularly.Was feeling ill a lot with the 300 mg avapro. Cut it to 1/2 and has been feeling better.  Patient in for follow-up of GERD. Currently asymptomatic taking  PPI daily. There is no chest pain or heartburn. No hematemesis and no melena. No dysphagia or choking. Onset is remote. Progression is stable. Complicating factors, none.        10/22/2021    1:56 PM 08/04/2021    1:38 PM 04/21/2021    2:17 PM 02/09/2021    9:42 AM  GAD 7 : Generalized Anxiety Score  Nervous, Anxious, on Edge '1 1 1 2  '$ Control/stop worrying 1 0 0 0  Worry too much - different things 0 0 0 0  Trouble relaxing 1 0 0 2  Restless 0 '1 1 1  '$ Easily annoyed or irritable 1 1 0 2  Afraid - awful might happen 0 0 0 0  Total GAD 7 Score '4 3 2 7  '$ Anxiety Difficulty Not difficult at all Somewhat difficult Not difficult at all Somewhat difficult        10/22/2021    1:56 PM 08/04/2021    1:37 PM 08/04/2021    1:30 PM  Depression screen PHQ 2/9  Decreased Interest 1 1 0  Down, Depressed, Hopeless 0 1 0  PHQ - 2 Score 1 2 0  Altered sleeping 0 0   Tired, decreased energy 0 1   Change in appetite 0 0   Feeling bad or failure about yourself  0 0   Trouble concentrating 0 0   Moving slowly or fidgety/restless 0 0   Suicidal thoughts 0 0   PHQ-9 Score 1 3   Difficult doing work/chores Not difficult at all Somewhat difficult     History Lacey Christensen has a past medical history of Cataract, Hypertension, Narrow angle glaucoma suspect of both eyes, PUD (peptic ulcer disease), and Retinal vessel  occlusion.   She has a past surgical history that includes dilitation and curritage; Colonoscopy (N/A, 10/24/2014); and Esophagogastroduodenoscopy (N/A, 10/24/2014).   Her family history includes Diabetes in her mother; Hypertension in her father and mother.She reports that she has been smoking cigarettes. She has a 25.00 pack-year smoking history. She has never used smokeless tobacco. She reports that she does not drink alcohol. No history on file for drug use.    ROS Review of Systems  Constitutional: Negative.   HENT: Negative.    Eyes:  Negative for visual disturbance.  Respiratory:  Negative for shortness of breath.   Cardiovascular:  Negative for chest pain.  Gastrointestinal:  Negative for abdominal pain.  Musculoskeletal:  Negative for arthralgias.    Objective:  BP 118/72   Pulse 78   Temp 98.6 F (37 C)   Ht '5\' 5"'$  (1.651 m)   Wt 145 lb 9.6 oz (66 kg)   SpO2 96%   BMI 24.23 kg/m   BP Readings from Last 3 Encounters:  10/22/21 118/72  08/04/21 129/72  04/21/21 126/72    Wt Readings from Last 3 Encounters:  10/22/21 145 lb 9.6 oz (  66 kg)  08/04/21 146 lb (66.2 kg)  04/21/21 145 lb 12.8 oz (66.1 kg)     Physical Exam Constitutional:      General: She is not in acute distress.    Appearance: She is well-developed.  Cardiovascular:     Rate and Rhythm: Normal rate and regular rhythm.  Pulmonary:     Breath sounds: Normal breath sounds.  Musculoskeletal:        General: No tenderness (ROM for knees). Normal range of motion.  Skin:    General: Skin is warm and dry.  Neurological:     Mental Status: She is alert and oriented to person, place, and time.       Assessment & Plan:   Lacey Christensen was seen today for medical management of chronic issues.  Diagnoses and all orders for this visit:  Generalized anxiety disorder -     ALPRAZolam (XANAX) 0.25 MG tablet; TAKE 1/2 TABLET 3 TIMES A DAY AS NEEDED FOR ANXIETY  Gastroesophageal reflux disease, unspecified  whether esophagitis present  Essential hypertension  Other orders -     irbesartan (AVAPRO) 150 MG tablet; Take 1 tablet (150 mg total) by mouth daily.       I have changed Lacey Christensen's irbesartan. I am also having her maintain her Calcium Carbonate-Vitamin D (CALCARB 600/D PO), diphenhydrAMINE, latanoprost, Krill Oil, Vitamin D, Aspirin-Acetaminophen-Caffeine (GOODY HEADACHE PO), multivitamin, esomeprazole, verapamil, and ALPRAZolam.  Allergies as of 10/22/2021       Reactions   Corn-containing Products Itching   Meloxicam Diarrhea   Sulfa Antibiotics Other (See Comments)   Childhood reaction-unknown   Timolol Other (See Comments)   depression   Tape Rash        Medication List        Accurate as of October 22, 2021  5:06 PM. If you have any questions, ask your nurse or doctor.          ALPRAZolam 0.25 MG tablet Commonly known as: XANAX TAKE 1/2 TABLET 3 TIMES A DAY AS NEEDED FOR ANXIETY   CALCARB 600/D PO Take 1 tablet by mouth every evening.   diphenhydrAMINE 25 mg capsule Commonly known as: BENADRYL Take 25 mg by mouth every 6 (six) hours as needed for itching or allergies.   esomeprazole 20 MG capsule Commonly known as: NEXIUM Take 1 capsule (20 mg total) by mouth daily.   GOODY HEADACHE PO Take 1 packet by mouth daily as needed for headache.   irbesartan 150 MG tablet Commonly known as: AVAPRO Take 1 tablet (150 mg total) by mouth daily. What changed:  medication strength how much to take Changed by: Claretta Fraise, MD   Krill Oil 300 MG Caps Take 2 capsules (600 mg total) by mouth 2 (two) times daily.   latanoprost 0.005 % ophthalmic solution Commonly known as: XALATAN 1 drop.   multivitamin tablet Take 1 tablet by mouth daily.   verapamil 240 MG CR tablet Commonly known as: CALAN-SR TAKE 1 TABLET BY MOUTH EVERY DAY   Vitamin D 50 MCG (2000 UT) tablet Take 1 tablet (2,000 Units total) by mouth daily.         Follow-up: Return  in about 6 months (around 04/24/2022), or if symptoms worsen or fail to improve.  Claretta Fraise, M.D.

## 2021-11-05 DIAGNOSIS — H401113 Primary open-angle glaucoma, right eye, severe stage: Secondary | ICD-10-CM | POA: Diagnosis not present

## 2021-11-05 DIAGNOSIS — H348312 Tributary (branch) retinal vein occlusion, right eye, stable: Secondary | ICD-10-CM | POA: Diagnosis not present

## 2021-11-05 DIAGNOSIS — Z961 Presence of intraocular lens: Secondary | ICD-10-CM | POA: Diagnosis not present

## 2021-11-05 DIAGNOSIS — H26493 Other secondary cataract, bilateral: Secondary | ICD-10-CM | POA: Diagnosis not present

## 2021-11-05 DIAGNOSIS — H401121 Primary open-angle glaucoma, left eye, mild stage: Secondary | ICD-10-CM | POA: Diagnosis not present

## 2021-12-07 ENCOUNTER — Other Ambulatory Visit: Payer: Self-pay | Admitting: Family Medicine

## 2021-12-07 DIAGNOSIS — I10 Essential (primary) hypertension: Secondary | ICD-10-CM

## 2021-12-31 DIAGNOSIS — H348312 Tributary (branch) retinal vein occlusion, right eye, stable: Secondary | ICD-10-CM | POA: Diagnosis not present

## 2021-12-31 DIAGNOSIS — H401113 Primary open-angle glaucoma, right eye, severe stage: Secondary | ICD-10-CM | POA: Diagnosis not present

## 2021-12-31 DIAGNOSIS — Z961 Presence of intraocular lens: Secondary | ICD-10-CM | POA: Diagnosis not present

## 2021-12-31 DIAGNOSIS — H26493 Other secondary cataract, bilateral: Secondary | ICD-10-CM | POA: Diagnosis not present

## 2021-12-31 DIAGNOSIS — H401121 Primary open-angle glaucoma, left eye, mild stage: Secondary | ICD-10-CM | POA: Diagnosis not present

## 2022-01-11 DIAGNOSIS — Z1231 Encounter for screening mammogram for malignant neoplasm of breast: Secondary | ICD-10-CM | POA: Diagnosis not present

## 2022-01-13 ENCOUNTER — Other Ambulatory Visit: Payer: Self-pay | Admitting: Family Medicine

## 2022-01-18 ENCOUNTER — Ambulatory Visit (INDEPENDENT_AMBULATORY_CARE_PROVIDER_SITE_OTHER): Payer: Medicare Other | Admitting: Family Medicine

## 2022-01-18 ENCOUNTER — Encounter: Payer: Self-pay | Admitting: Family Medicine

## 2022-01-18 DIAGNOSIS — J40 Bronchitis, not specified as acute or chronic: Secondary | ICD-10-CM | POA: Diagnosis not present

## 2022-01-18 DIAGNOSIS — J329 Chronic sinusitis, unspecified: Secondary | ICD-10-CM

## 2022-01-18 MED ORDER — AZITHROMYCIN 250 MG PO TABS: ORAL_TABLET | ORAL | 0 refills | Status: DC

## 2022-01-18 NOTE — Progress Notes (Signed)
Subjective:    Patient ID: Lacey Christensen, female    DOB: [birth year], 76 y.o.   MRN: [id number]   HPI: Lacey Christensen is a 76 y.o. female presenting for ST. Earache, productive cough. T 99.4 this AM. Started with drainage posteriorly yesterday. She and her husband both had negative Covid tests.       10/22/2021    1:56 PM 08/04/2021    1:37 PM 08/04/2021    1:30 PM 04/21/2021    2:16 PM 04/21/2021    1:58 PM  Depression screen PHQ 2/9  Decreased Interest 1 1 0 1 0  Down, Depressed, Hopeless 0 1 0 1 0  PHQ - 2 Score 1 2 0 2 0  Altered sleeping 0 0  0   Tired, decreased energy 0 1  0   Change in appetite 0 0  0   Feeling bad or failure about yourself  0 0  0   Trouble concentrating 0 0  0   Moving slowly or fidgety/restless 0 0  0   Suicidal thoughts 0 0  0   PHQ-9 Score '1 3  2   '$ Difficult doing work/chores Not difficult at all Somewhat difficult  Not difficult at all      Relevant past medical, surgical, family and social history reviewed and updated as indicated.  Interim medical history since our last visit reviewed. Allergies and medications reviewed and updated.  ROS:  Review of Systems  Constitutional:  Negative for activity change, appetite change, chills and fever.  HENT:  Positive for congestion, postnasal drip, rhinorrhea and sinus pressure. Negative for ear discharge, ear pain, hearing loss, nosebleeds, sneezing and trouble swallowing.   Respiratory:  Negative for chest tightness and shortness of breath.   Cardiovascular:  Negative for chest pain and palpitations.  Skin:  Negative for rash.     Social History   Tobacco Use  Smoking Status Every Day   Packs/day: 0.50   Years: 50.00   Total pack years: 25.00   Types: Cigarettes  Smokeless Tobacco Never       Objective:     Wt Readings from Last 3 Encounters:  10/22/21 145 lb 9.6 oz (66 kg)  08/04/21 146 lb (66.2 kg)  04/21/21 145 lb 12.8 oz (66.1 kg)     Exam deferred. Pt. Harboring due to COVID 19.  Phone visit performed.   Assessment & Plan:   1. Sinobronchitis     Meds ordered this encounter  Medications   azithromycin (ZITHROMAX Z-PAK) 250 MG tablet    Sig: Take two right away Then one a day for the next 4 days.    Dispense:  6 each    Refill:  0    No orders of the defined types were placed in this encounter.     Diagnoses and all orders for this visit:  Sinobronchitis  Other orders -     azithromycin (ZITHROMAX Z-PAK) 250 MG tablet; Take two right away Then one a day for the next 4 days.    Virtual Visit via telephone Note  I discussed the limitations, risks, security and privacy concerns of performing an evaluation and management service by telephone and the availability of in person appointments. The patient was identified with two identifiers. Pt.expressed understanding and agreed to proceed. Pt. Is at home. Dr. Livia Snellen is in his office.  Follow Up Instructions:   I discussed the assessment and treatment plan with the patient. The patient was provided an opportunity to ask  questions and all were answered. The patient agreed with the plan and demonstrated an understanding of the instructions.   The patient was advised to call back or seek an in-person evaluation if the symptoms worsen or if the condition fails to improve as anticipated.   Total minutes including chart review and phone contact time: 7   Follow up plan: Return if symptoms worsen or fail to improve.  Claretta Fraise, MD [place]

## 2022-02-02 ENCOUNTER — Ambulatory Visit (INDEPENDENT_AMBULATORY_CARE_PROVIDER_SITE_OTHER): Payer: Medicare Other

## 2022-02-02 DIAGNOSIS — Z0000 Encounter for general adult medical examination without abnormal findings: Secondary | ICD-10-CM | POA: Diagnosis not present

## 2022-02-02 NOTE — Progress Notes (Signed)
Subjective:   Lacey Christensen is a 76 y.o. female who presents for Medicare Annual (Subsequent) preventive examination.  I connected with  [name] on 02/02/22 by a audio enabled telemedicine application and verified that I am speaking with the correct person using two identifiers.  Patient Location: Home  Provider Location: Office/Clinic  I discussed the limitations of evaluation and management by telemedicine. The patient expressed understanding and agreed to proceed.   Review of Systems    Defer to PCP   Cardiac Risk Factors include: advanced age (>43mn, >>72women);dyslipidemia;hypertension;smoking/ tobacco exposure     Objective:    There were no vitals filed for this visit. There is no height or weight on file to calculate BMI.     02/02/2022   11:26 AM 01/30/2021    3:50 PM 03/21/2017    2:20 PM 03/21/2017    9:35 AM 09/23/2014    8:39 AM 06/21/2014    6:08 PM  Advanced Directives  Does Patient Have a Medical Advance Directive? Yes Yes Yes Yes No No  Type of [place]amed[place]of [place] Healthcare Power of [place]   Does patient want to make changes to medical advance directive? No - Patient declined  No - Patient declined     Copy of HHickmanin Chart? No - copy requested No - copy requested No - copy requested     Would patient like information on creating a medical advance directive?     No - patient declined information No - patient declined information    Current Medications (verified) Outpatient Encounter Medications as of 02/02/2022  Medication Sig   ALPRAZolam (XANAX) 0.25 MG tablet TAKE 1/2 TABLET 3 TIMES A DAY AS NEEDED FOR ANXIETY   Aspirin-Acetaminophen-Caffeine (GOODY HEADACHE PO) Take 1 packet by mouth daily as needed for headache.   Calcium Carbonate-Vitamin D (CALCARB 600/D PO) Take 1 tablet by mouth every evening.   Cholecalciferol (VITAMIN  D) 2000 units tablet Take 1 tablet (2,000 Units total) by mouth daily.   diphenhydrAMINE (BENADRYL) 25 mg capsule Take 25 mg by mouth every 6 (six) hours as needed for itching or allergies.   esomeprazole (NEXIUM) 20 MG capsule Take 1 capsule (20 mg total) by mouth daily.   irbesartan (AVAPRO) 150 MG tablet Take 1 tablet (150 mg total) by mouth daily.   Krill Oil 300 MG CAPS Take 2 capsules (600 mg total) by mouth 2 (two) times daily.   latanoprost (XALATAN) 0.005 % ophthalmic solution 1 drop.   Multiple Vitamin (MULTIVITAMIN) tablet Take 1 tablet by mouth daily.   verapamil (CALAN-SR) 240 MG CR tablet TAKE 1 TABLET BY MOUTH EVERY DAY   [DISCONTINUED] azithromycin (ZITHROMAX Z-PAK) 250 MG tablet Take two right away Then one a day for the next 4 days.   No facility-administered encounter medications on file as of 02/02/2022.    Allergies (verified) Corn-containing products, Meloxicam, Sulfa antibiotics, Timolol, and Tape   History: Past Medical History:  Diagnosis Date   Cataract    Hypertension    Narrow angle glaucoma suspect of both eyes    PUD (peptic ulcer disease)    [facility] [place] [facility]  Retinal vessel occlusion    Past Surgical History:  Procedure Laterality Date   COLONOSCOPY N/A 10/24/2014   SLF: 1. Angulated rectosigmoid junction 2. Two rectal polyps removed 3. Small internal hemorrhoids  dilitation and curritage     ESOPHAGOGASTRODUODENOSCOPY N/A 10/24/2014   SLF: 1. Schatzki's ring at the gastroesophageal junction 2. Deformed pylous and superficial pyloric ulcer 3. Non-erosive gastritis ( inflammation) was found in the gastric antrum; multiple biopsies were performed. 4. Stricture at the junction of D!/D@   Family History  Problem Relation Age of Onset   Diabetes Mother    Hypertension Mother    Hypertension Father    Colon cancer Neg Hx    Social History   Socioeconomic History   Marital status: Married    Spouse  name: Jeneen Rinks   Number of children: 0   Years of education: 12   Highest education level: 12th grade  Occupational History   Occupation: retired  Tobacco Use   Smoking status: Every Day    Packs/day: 0.50    Years: 50.00    Total pack years: 25.00    Types: Cigarettes   Smokeless tobacco: Never  Vaping Use   Vaping Use: Never used  Substance and Sexual Activity   Alcohol use: No    Alcohol/week: 0.0 standard drinks of alcohol   Drug use: Never   Sexual activity: Not Currently    Birth control/protection: Post-menopausal  Other Topics Concern   Not on file  Social History Narrative   One level home with her husband   No children, but very close with nieces and nephews   Lots of family nearby   Very active and independent   Social Determinants of Health   Financial Resource Strain: Low Risk  (02/02/2022)   Overall Financial Resource Strain (CARDIA)    Difficulty of Paying Living Expenses: Not hard at all  Food Insecurity: No Food Insecurity (02/02/2022)   Hunger Vital Sign    Worried About Running Out of Food in the Last Year: Never true    Ran Out of Food in the Last Year: Never true  Transportation Needs: No Transportation Needs (02/02/2022)   PRAPARE - Hydrologist (Medical): No    Lack of Transportation (Non-Medical): No  Physical Activity: Sufficiently Active (02/02/2022)   Exercise Vital Sign    Days of Exercise per Week: 5 days    Minutes of Exercise per Session: 30 min  Stress: No Stress Concern Present (02/02/2022)   [place]    Feeling of Stress : Not at all  Social Connections: Moderately Isolated (02/02/2022)   Social Connection and Isolation Panel [NHANES]    Frequency of Communication with Friends and Family: More than three times a week    Frequency of Social Gatherings with Friends and Family: More than three times a week    Attends Religious Services: Never     Marine scientist or Organizations: No    Attends Music therapist: Never    Marital Status: Married    Clinical Intake:  Pre-visit preparation completed: Yes  Pain : No/denies pain    Nutritional Risks: None Diabetes: No  How often do you need to have someone help you when you read instructions, pamphlets, or other written materials from your doctor or pharmacy?: 1 - Never What is the last grade level you completed in school?: 12th grade  Diabetic?  No  Interpreter Needed?: No  Information entered by :: Felicity Coyer LPN   Activities of Daily Living    02/02/2022   11:26 AM  In your present state of health, do you have any difficulty performing the  following activities:  Hearing? 0  Vision? 1  Comment glaucoma in right eye, sees Dr. Edilia Bo  Difficulty concentrating or making decisions? 0  Walking or climbing stairs? 0  Dressing or bathing? 0  Doing errands, shopping? 0  Preparing Food and eating ? N  Using the Toilet? N  In the past six months, have you accidently leaked urine? N  Do you have problems with loss of bowel control? N  Managing your Medications? N  Managing your Finances? N  Housekeeping or managing your Housekeeping? N    Patient Care Team: Claretta Fraise, MD as PCP - General (Family Medicine) Danie Binder, MD (Inactive) as Consulting Physician (Gastroenterology) Marcy Siren, MD as Referring Physician (Obstetrics and Gynecology)  Indicate any recent Medical Services you may have received from other than [facility] providers in the past year (date may be approximate).     Assessment:   This is a routine wellness examination for [facility].  Hearing/Vision screen No results found.  Dietary issues and exercise activities discussed: Current Exercise Habits: The patient does not participate in regular exercise at present, Exercise limited by: None identified   Goals Addressed             This Visit's Progress    Patient Stated        02/02/2022 AWV Goal: Fall Prevention  Over the next year, patient will decrease their risk for falls by: Using assistive devices, such as a cane or walker, as needed Identifying fall risks within their home and correcting them by: Removing throw rugs Adding handrails to stairs or ramps Removing clutter and keeping a clear pathway throughout the home Increasing light, especially at night Adding shower handles/bars Raising toilet seat Identifying potential personal risk factors for falls: Medication side effects Incontinence/urgency Vestibular dysfunction Hearing loss Musculoskeletal disorders Neurological disorders Orthostatic hypotension         Depression Screen    02/02/2022   11:25 AM 10/22/2021    1:56 PM 08/04/2021    1:37 PM 08/04/2021    1:30 PM 04/21/2021    2:16 PM 04/21/2021    1:58 PM 02/09/2021    9:42 AM  PHQ 2/9 Scores  PHQ - 2 Score 0 1 2 0 2 0 2  PHQ- 9 Score  '1 3  2  3    '$ Fall Risk    02/02/2022   11:30 AM 08/04/2021    1:30 PM 04/21/2021    1:58 PM 01/30/2021    3:49 PM 01/27/2021    8:12 AM  Fall Risk   Falls in the past year? 0 0 0 0 0  Number falls in past yr:    0   Injury with Fall?    0   Risk for fall due to :    Medication side effect   Follow up Falls evaluation completed   Falls prevention discussed     FALL RISK PREVENTION PERTAINING TO THE HOME:  Any stairs in or around the home? Yes  If so, are there any without handrails? No  Home free of loose throw rugs in walkways, pet beds, electrical cords, etc? Yes  Adequate lighting in your home to reduce risk of falls? Yes   ASSISTIVE DEVICES UTILIZED TO PREVENT FALLS:  Life alert? No  Use of a cane, walker or w/c? No  Grab bars in the bathroom? Yes  Shower chair or bench in shower? No  Elevated toilet seat or a handicapped toilet? Yes   TIMED UP AND GO:  Was the test performed? No .   Cognitive Function:        02/02/2022   11:28 AM 01/30/2021    3:42 PM  6CIT Screen  What  Year? 0 points 0 points  What month? 0 points 0 points  What time? 0 points 0 points  Count back from 20 0 points 0 points  Months in reverse 0 points 2 points  Repeat phrase 0 points 0 points  Total Score 0 points 2 points    Immunizations Immunization History  Administered Date(s) Administered   Influenza-Unspecified 03/04/2011   Moderna SARS-COV2 Booster Vaccination 04/09/2020   Moderna Sars-Covid-2 Vaccination 07/18/2019, 08/15/2019   Pneumococcal Conjugate-13 04/15/2015   Pneumococcal Polysaccharide-23 10/16/2012    TDAP status: Due, Education has been provided regarding the importance of this vaccine. Advised may receive this vaccine at local pharmacy or Health Dept. Aware to provide a copy of the vaccination record if obtained from local pharmacy or Health Dept. Verbalized acceptance and understanding.  Flu Vaccine status: Declined, Education has been provided regarding the importance of this vaccine but patient still declined. Advised may receive this vaccine at local pharmacy or Health Dept. Aware to provide a copy of the vaccination record if obtained from local pharmacy or Health Dept. Verbalized acceptance and understanding.  Pneumococcal vaccine status: Due, Education has been provided regarding the importance of this vaccine. Advised may receive this vaccine at local pharmacy or Health Dept. Aware to provide a copy of the vaccination record if obtained from local pharmacy or Health Dept. Verbalized acceptance and understanding.  Covid-19 vaccine status: Completed vaccines  Qualifies for Shingles Vaccine? Yes   Zostavax completed No   Shingrix Completed?: No.    Education has been provided regarding the importance of this vaccine. Patient has been advised to call insurance company to determine out of pocket expense if they have not yet received this vaccine. Advised may also receive vaccine at local pharmacy or Health Dept. Verbalized acceptance and  understanding.  Screening Tests Health Maintenance  Topic Date Due   Zoster Vaccines- Shingrix (1 of 2) Never done   Lung Cancer Screening  Never done   COVID-19 Vaccine (3 - Moderna risk series) 05/07/2020   Medicare Annual Wellness (AWV)  01/30/2022   INFLUENZA VACCINE  06/27/2022 (Originally 10/27/2021)   TETANUS/TDAP  03/24/2022   Pneumonia Vaccine 86+ Years old  Completed   DEXA SCAN  Completed   Hepatitis C Screening  Completed   HPV VACCINES  Aged Out   COLONOSCOPY (Pts 45-44yr Insurance coverage will need to be confirmed)  Discontinued    Health Maintenance  Health Maintenance Due  Topic Date Due   Zoster Vaccines- Shingrix (1 of 2) Never done   Lung Cancer Screening  Never done   COVID-19 Vaccine (3 - Moderna risk series) 05/07/2020   Medicare Annual Wellness (AWV)  01/30/2022    Colorectal cancer screening: No longer required.   Mammogram status: No longer required due to age.  Bone Density status: Completed 07/30/2020. Results reflect: Bone density results: OSTEOPOROSIS. Repeat every two years.  Lung Cancer Screening: (Low Dose CT Chest recommended if Age 76-80years, 30 pack-year currently smoking OR have quit w/in 15years.) does qualify.     Additional Screening:  Hepatitis C Screening: does not qualify   Vision Screening: Recommended annual ophthalmology exams for early detection of glaucoma and other disorders of the eye. Is the patient up to date with their annual eye exam?  Yes  Who is the provider  or what is the name of the office in which the patient attends annual eye exams? Dr. Leota Sauers If pt is not established with a provider, would they like to be referred to a provider to establish care?  N/A .   Dental Screening: Recommended annual dental exams for proper oral hygiene  Community Resource Referral / Chronic Care Management: CRR required this visit?  No   CCM required this visit?  No      Plan:     I have personally reviewed and noted  the following in the patient's chart:   Medical and social history Use of alcohol, tobacco or illicit drugs  Current medications and supplements including opioid prescriptions. Patient is not currently taking opioid prescriptions. Functional ability and status Nutritional status Physical activity Advanced directives List of other physicians Hospitalizations, surgeries, and ER visits in previous 12 months Vitals Screenings to include cognitive, depression, and falls Referrals and appointments  In addition, I have reviewed and discussed with patient certain preventive protocols, quality metrics, and best practice recommendations. A written personalized care plan for preventive services as well as general preventive health recommendations were provided to patient.     Felicity Coyer LPN    [id number]/06/[id number]   Nurse Notes: Patient is due for Tdap and Shingrix vaccine.

## 2022-02-05 ENCOUNTER — Other Ambulatory Visit: Payer: Self-pay | Admitting: Family Medicine

## 2022-03-02 ENCOUNTER — Telehealth: Payer: Self-pay | Admitting: Family Medicine

## 2022-03-02 NOTE — Telephone Encounter (Signed)
Up to date   Patient aware

## 2022-04-08 DIAGNOSIS — H348312 Tributary (branch) retinal vein occlusion, right eye, stable: Secondary | ICD-10-CM | POA: Diagnosis not present

## 2022-04-08 DIAGNOSIS — H26493 Other secondary cataract, bilateral: Secondary | ICD-10-CM | POA: Diagnosis not present

## 2022-04-08 DIAGNOSIS — Z961 Presence of intraocular lens: Secondary | ICD-10-CM | POA: Diagnosis not present

## 2022-04-08 DIAGNOSIS — H401113 Primary open-angle glaucoma, right eye, severe stage: Secondary | ICD-10-CM | POA: Diagnosis not present

## 2022-04-08 DIAGNOSIS — H401121 Primary open-angle glaucoma, left eye, mild stage: Secondary | ICD-10-CM | POA: Diagnosis not present

## 2022-04-26 ENCOUNTER — Encounter: Payer: Self-pay | Admitting: Family Medicine

## 2022-04-26 ENCOUNTER — Ambulatory Visit (INDEPENDENT_AMBULATORY_CARE_PROVIDER_SITE_OTHER): Payer: Medicare Other | Admitting: Family Medicine

## 2022-04-26 VITALS — BP 156/82 | HR 103 | Temp 97.2°F | Ht 65.0 in | Wt 145.0 lb

## 2022-04-26 DIAGNOSIS — I10 Essential (primary) hypertension: Secondary | ICD-10-CM

## 2022-04-26 DIAGNOSIS — Z79899 Other long term (current) drug therapy: Secondary | ICD-10-CM | POA: Diagnosis not present

## 2022-04-26 DIAGNOSIS — E782 Mixed hyperlipidemia: Secondary | ICD-10-CM

## 2022-04-26 DIAGNOSIS — F411 Generalized anxiety disorder: Secondary | ICD-10-CM

## 2022-04-26 MED ORDER — IRBESARTAN 300 MG PO TABS: 300.0000 mg | ORAL_TABLET | Freq: Every day | ORAL | 0 refills | Status: DC

## 2022-04-26 MED ORDER — ALPRAZOLAM 0.25 MG PO TABS: ORAL_TABLET | ORAL | 1 refills | Status: DC

## 2022-04-26 NOTE — Progress Notes (Signed)
Subjective:  Patient ID: Lacey Christensen, female    DOB: 05/31/1945  Age: 77 y.o. MRN: [id number]  CC: Medical Management of Chronic Issues   HPI Lacey Christensen presents for  presents for  follow-up of hypertension. Patient has no history of headache chest pain or shortness of breath or recent cough. Patient also denies symptoms of TIA such as focal numbness or weakness. Patient denies side effects from medication. States taking it regularly. Anxious today about the visit. BP usually much better. Taking a half of an irbesartan daily.  He     04/26/2022   11:09 AM 04/26/2022   10:57 AM 02/02/2022   11:25 AM  Depression screen PHQ 2/9  Decreased Interest 0 0 0  Down, Depressed, Hopeless 0 0 0  PHQ - 2 Score 0 0 0  Altered sleeping 0    Tired, decreased energy 1    Change in appetite 0    Feeling bad or failure about yourself  0    Trouble concentrating 0    Moving slowly or fidgety/restless 0    Suicidal thoughts 0    PHQ-9 Score 1    Difficult doing work/chores Somewhat difficult        04/26/2022   11:10 AM 10/22/2021    1:56 PM 08/04/2021    1:38 PM 04/21/2021    2:17 PM  GAD 7 : Generalized Anxiety Score  Nervous, Anxious, on Edge '1 1 1 1  '$ Control/stop worrying 0 1 0 0  Worry too much - different things 0 0 0 0  Trouble relaxing 1 1 0 0  Restless 0 0 1 1  Easily annoyed or irritable '1 1 1 '$ 0  Afraid - awful might happen 0 0 0 0  Total GAD 7 Score '3 4 3 2  '$ Anxiety Difficulty Somewhat difficult Not difficult at all Somewhat difficult Not difficult at all     History Lacey Christensen has a past medical history of Cataract, Hypertension, Narrow angle glaucoma suspect of both eyes, PUD (peptic ulcer disease), and Retinal vessel occlusion.   She has a past surgical history that includes dilitation and curritage; Colonoscopy (N/A, 10/24/2014); and Esophagogastroduodenoscopy (N/A, 10/24/2014).   Her family history includes Diabetes in her mother; Hypertension in her father and mother.She  reports that she has been smoking cigarettes. She has a 25.00 pack-year smoking history. She has never used smokeless tobacco. She reports that she does not drink alcohol and does not use drugs.    ROS Review of Systems  Constitutional: Negative.   HENT: Negative.    Eyes:  Negative for visual disturbance.  Respiratory:  Negative for shortness of breath.   Cardiovascular:  Negative for chest pain.  Gastrointestinal:  Negative for abdominal pain.  Musculoskeletal:  Negative for arthralgias.    Objective:  BP (!) 156/82   Pulse (!) 103   Temp (!) 97.2 F (36.2 C)   Ht '5\' 5"'$  (1.651 m)   Wt 145 lb (65.8 kg)   SpO2 97%   BMI 24.13 kg/m   BP Readings from Last 3 Encounters:  04/26/22 (!) 156/82  10/22/21 118/72  08/04/21 129/72    Wt Readings from Last 3 Encounters:  04/26/22 145 lb (65.8 kg)  10/22/21 145 lb 9.6 oz (66 kg)  08/04/21 146 lb (66.2 kg)     Physical Exam Constitutional:      General: She is not in acute distress.    Appearance: She is well-developed.  Cardiovascular:     Rate and Rhythm:  Normal rate and regular rhythm.  Pulmonary:     Breath sounds: Normal breath sounds.  Musculoskeletal:        General: Normal range of motion.  Skin:    General: Skin is warm and dry.  Neurological:     Mental Status: She is alert and oriented to person, place, and time.       Assessment & Plan:   Lacey Christensen was seen today for medical management of chronic issues.  Diagnoses and all orders for this visit:  Controlled substance agreement signed -     ToxASSURE Select 13 (MW), Urine  Essential hypertension -     CBC with Differential/Platelet -     CMP14+EGFR  Mixed hyperlipidemia -     Lipid panel  Generalized anxiety disorder -     ALPRAZolam (XANAX) 0.25 MG tablet; TAKE 1/2 TABLET 3 TIMES A DAY AS NEEDED FOR ANXIETY  Other orders -     irbesartan (AVAPRO) 300 MG tablet; Take 1 tablet (300 mg total) by mouth daily.       I have discontinued Lacey  Christensen irbesartan. I have also changed her irbesartan. Additionally, I am having her maintain her Calcium Carbonate-Vitamin D (CALCARB 600/D PO), diphenhydrAMINE, latanoprost, Krill Oil, Vitamin D, Aspirin-Acetaminophen-Caffeine (GOODY HEADACHE PO), multivitamin, esomeprazole, verapamil, and ALPRAZolam.  Allergies as of 04/26/2022       Reactions   Corn-containing Products Itching   Meloxicam Diarrhea   Sulfa Antibiotics Other (See Comments)   Childhood reaction-unknown   Timolol Other (See Comments)   depression   Tape Rash        Medication List        Accurate as of April 26, 2022 11:44 AM. If you have any questions, ask your nurse or doctor.          ALPRAZolam 0.25 MG tablet Commonly known as: XANAX TAKE 1/2 TABLET 3 TIMES A DAY AS NEEDED FOR ANXIETY   CALCARB 600/D PO Take 1 tablet by mouth every evening.   diphenhydrAMINE 25 mg capsule Commonly known as: BENADRYL Take 25 mg by mouth every 6 (six) hours as needed for itching or allergies.   esomeprazole 20 MG capsule Commonly known as: NEXIUM Take 1 capsule (20 mg total) by mouth daily.   GOODY HEADACHE PO Take 1 packet by mouth daily as needed for headache.   irbesartan 300 MG tablet Commonly known as: AVAPRO Take 1 tablet (300 mg total) by mouth daily. What changed: Another medication with the same name was removed. Continue taking this medication, and follow the directions you see here. Changed by: Claretta Fraise, MD   Krill Oil 300 MG Caps Take 2 capsules (600 mg total) by mouth 2 (two) times daily.   latanoprost 0.005 % ophthalmic solution Commonly known as: XALATAN 1 drop.   multivitamin tablet Take 1 tablet by mouth daily.   verapamil 240 MG CR tablet Commonly known as: CALAN-SR TAKE 1 TABLET BY MOUTH EVERY DAY   Vitamin D 50 MCG (2000 UT) tablet Take 1 tablet (2,000 Units total) by mouth daily.         Follow-up: Return in about 6 months (around 10/25/2022).  Claretta Fraise,  M.D.

## 2022-04-27 LAB — CMP14+EGFR
ALT: 24 IU/L (ref 0–32)
AST: 21 IU/L (ref 0–40)
Albumin/Globulin Ratio: 1.3 (ref 1.2–2.2)
Albumin: 4.3 g/dL (ref 3.8–4.8)
Alkaline Phosphatase: 83 IU/L (ref 44–121)
BUN/Creatinine Ratio: 14 (ref 12–28)
BUN: 11 mg/dL (ref 8–27)
Bilirubin Total: 0.2 mg/dL (ref 0.0–1.2)
CO2: 21 mmol/L (ref 20–29)
Calcium: 10.7 mg/dL — ABNORMAL HIGH (ref 8.7–10.3)
Chloride: 103 mmol/L (ref 96–106)
Creatinine, Ser: 0.81 mg/dL (ref 0.57–1.00)
Globulin, Total: 3.2 g/dL (ref 1.5–4.5)
Glucose: 95 mg/dL (ref 70–99)
Potassium: 5 mmol/L (ref 3.5–5.2)
Sodium: 140 mmol/L (ref 134–144)
Total Protein: 7.5 g/dL (ref 6.0–8.5)
eGFR: 75 mL/min/{1.73_m2} (ref >59)

## 2022-04-27 LAB — CBC WITH DIFFERENTIAL/PLATELET
Basophils Absolute: 0.1 10*3/uL (ref 0.0–0.2)
Basos: 1 %
EOS (ABSOLUTE): 0.3 10*3/uL (ref 0.0–0.4)
Eos: 3 %
Hematocrit: 42.4 % (ref 34.0–46.6)
Hemoglobin: 14.4 g/dL (ref 11.1–15.9)
Immature Grans (Abs): 0 10*3/uL (ref 0.0–0.1)
Immature Granulocytes: 0 %
Lymphocytes Absolute: 3.7 10*3/uL — ABNORMAL HIGH (ref 0.7–3.1)
Lymphs: 31 %
MCH: 31.9 pg (ref 26.6–33.0)
MCHC: 34 g/dL (ref 31.5–35.7)
MCV: 94 fL (ref 79–97)
Monocytes Absolute: 1 10*3/uL — ABNORMAL HIGH (ref 0.1–0.9)
Monocytes: 9 %
Neutrophils Absolute: 6.7 10*3/uL (ref 1.4–7.0)
Neutrophils: 56 %
Platelets: 452 10*3/uL — ABNORMAL HIGH (ref 150–450)
RBC: 4.52 x10E6/uL (ref 3.77–5.28)
RDW: 13.4 % (ref 11.7–15.4)
WBC: 11.8 10*3/uL — ABNORMAL HIGH (ref 3.4–10.8)

## 2022-04-27 LAB — LIPID PANEL
Chol/HDL Ratio: 4.8 ratio — ABNORMAL HIGH (ref 0.0–4.4)
Cholesterol, Total: 213 mg/dL — ABNORMAL HIGH (ref 100–199)
HDL: 44 mg/dL (ref >39)
LDL Chol Calc (NIH): 129 mg/dL — ABNORMAL HIGH (ref 0–99)
Triglycerides: 226 mg/dL — ABNORMAL HIGH (ref 0–149)
VLDL Cholesterol Cal: 40 mg/dL (ref 5–40)

## 2022-04-28 LAB — TOXASSURE SELECT 13 (MW), URINE

## 2022-05-03 DIAGNOSIS — H401121 Primary open-angle glaucoma, left eye, mild stage: Secondary | ICD-10-CM | POA: Diagnosis not present

## 2022-05-03 DIAGNOSIS — H401113 Primary open-angle glaucoma, right eye, severe stage: Secondary | ICD-10-CM | POA: Diagnosis not present

## 2022-05-27 DIAGNOSIS — H348312 Tributary (branch) retinal vein occlusion, right eye, stable: Secondary | ICD-10-CM | POA: Diagnosis not present

## 2022-05-27 DIAGNOSIS — H26493 Other secondary cataract, bilateral: Secondary | ICD-10-CM | POA: Diagnosis not present

## 2022-05-27 DIAGNOSIS — H401113 Primary open-angle glaucoma, right eye, severe stage: Secondary | ICD-10-CM | POA: Diagnosis not present

## 2022-05-27 DIAGNOSIS — H401121 Primary open-angle glaucoma, left eye, mild stage: Secondary | ICD-10-CM | POA: Diagnosis not present

## 2022-05-27 DIAGNOSIS — Z961 Presence of intraocular lens: Secondary | ICD-10-CM | POA: Diagnosis not present

## 2022-07-15 DIAGNOSIS — H348312 Tributary (branch) retinal vein occlusion, right eye, stable: Secondary | ICD-10-CM | POA: Diagnosis not present

## 2022-09-09 DIAGNOSIS — H401130 Primary open-angle glaucoma, bilateral, stage unspecified: Secondary | ICD-10-CM | POA: Diagnosis not present

## 2022-09-09 DIAGNOSIS — H348312 Tributary (branch) retinal vein occlusion, right eye, stable: Secondary | ICD-10-CM | POA: Diagnosis not present

## 2022-09-09 DIAGNOSIS — H26493 Other secondary cataract, bilateral: Secondary | ICD-10-CM | POA: Diagnosis not present

## 2022-10-20 ENCOUNTER — Other Ambulatory Visit: Payer: Self-pay | Admitting: Family Medicine

## 2022-10-25 ENCOUNTER — Encounter: Payer: Self-pay | Admitting: Family Medicine

## 2022-10-25 ENCOUNTER — Ambulatory Visit: Payer: Medicare Other | Admitting: Family Medicine

## 2022-10-25 VITALS — BP 136/70 | HR 84 | Temp 97.7°F | Ht 65.0 in | Wt 146.6 lb

## 2022-10-25 DIAGNOSIS — E782 Mixed hyperlipidemia: Secondary | ICD-10-CM

## 2022-10-25 DIAGNOSIS — F411 Generalized anxiety disorder: Secondary | ICD-10-CM | POA: Diagnosis not present

## 2022-10-25 DIAGNOSIS — I10 Essential (primary) hypertension: Secondary | ICD-10-CM | POA: Diagnosis not present

## 2022-10-25 MED ORDER — RAMIPRIL 10 MG PO CAPS: 10.0000 mg | ORAL_CAPSULE | Freq: Every day | ORAL | 3 refills | Status: DC

## 2022-10-25 MED ORDER — ALPRAZOLAM 0.25 MG PO TABS: ORAL_TABLET | ORAL | 1 refills | Status: DC

## 2022-10-25 NOTE — Progress Notes (Signed)
Subjective:  Patient ID: Lacey Christensen, female    DOB: 02/04/1946  Age: 77 y.o. MRN: [id number]  CC: Medical Management of Chronic Issues   HPI Lacey Christensen presents for left ankle pain for several days in the morning. Then was bending, tried to get up and experienced severe pain at the upper posterior calf.    presents for  follow-up of hypertension. Patient has no history of headache chest pain or shortness of breath or recent cough. Patient also denies symptoms of TIA such as focal numbness or weakness. Patient reports irbesartan causes her sulfa allergy to flare.      10/25/2022   11:29 AM 04/26/2022   11:10 AM 10/22/2021    1:56 PM 08/04/2021    1:38 PM  GAD 7 : Generalized Anxiety Score  Nervous, Anxious, on Edge 1 1 1 1   Control/stop worrying 1 0 1 0  Worry too much - different things 1 0 0 0  Trouble relaxing 0 1 1 0  Restless 1 0 0 1  Easily annoyed or irritable 1 1 1 1   Afraid - awful might happen 0 0 0 0  Total GAD 7 Score 5 3 4 3   Anxiety Difficulty Somewhat difficult Somewhat difficult Not difficult at all Somewhat difficult        10/25/2022   11:28 AM 10/25/2022   11:19 AM 04/26/2022   11:09 AM  Depression screen PHQ 2/9  Decreased Interest 1 0 0  Down, Depressed, Hopeless 1 0 0  PHQ - 2 Score 2 0 0  Altered sleeping 0  0  Tired, decreased energy 1  1  Change in appetite 0  0  Feeling bad or failure about yourself  0  0  Trouble concentrating 0  0  Moving slowly or fidgety/restless 0  0  Suicidal thoughts 0  0  PHQ-9 Score 3  1  Difficult doing work/chores Somewhat difficult  Somewhat difficult    History Lacey Christensen has a past medical history of Cataract, Hypertension, Narrow angle glaucoma suspect of both eyes, PUD (peptic ulcer disease), and Retinal vessel occlusion.   She has a past surgical history that includes dilitation and curritage; Colonoscopy (N/A, 10/24/2014); and Esophagogastroduodenoscopy (N/A, 10/24/2014).   Her family history includes Diabetes  in her mother; Hypertension in her father and mother.She reports that she has been smoking cigarettes. She has a 25 pack-year smoking history. She has never used smokeless tobacco. She reports that she does not drink alcohol and does not use drugs.    ROS Review of Systems  Constitutional:  Positive for fatigue.  HENT: Negative.    Eyes:  Negative for visual disturbance.  Respiratory:  Negative for shortness of breath.   Cardiovascular:  Negative for chest pain.  Gastrointestinal:  Negative for abdominal pain.  Musculoskeletal:  Negative for arthralgias.    Objective:  BP 136/70   Pulse 84   Temp 97.7 F (36.5 C)   Ht 5\' 5"  (1.651 m)   Wt 146 lb 9.6 oz (66.5 kg)   SpO2 94%   BMI 24.40 kg/m   BP Readings from Last 3 Encounters:  10/25/22 136/70  04/26/22 (!) 156/82  10/22/21 118/72    Wt Readings from Last 3 Encounters:  10/25/22 146 lb 9.6 oz (66.5 kg)  04/26/22 145 lb (65.8 kg)  10/22/21 145 lb 9.6 oz (66 kg)     Physical Exam Constitutional:      General: She is not in acute distress.    Appearance: She  is well-developed.  Cardiovascular:     Rate and Rhythm: Normal rate and regular rhythm.  Pulmonary:     Breath sounds: Normal breath sounds.  Musculoskeletal:        General: Normal range of motion.  Skin:    General: Skin is warm and dry.  Neurological:     Mental Status: She is alert and oriented to person, place, and time.       Assessment & Plan:   Lacey Christensen was seen today for medical management of chronic issues.  Diagnoses and all orders for this visit:  Essential hypertension -     CBC with Differential/Platelet -     CMP14+EGFR  Mixed hyperlipidemia -     TSH + free T4  Generalized anxiety disorder -     ALPRAZolam (XANAX) 0.25 MG tablet; TAKE 1/2 TABLET 3 TIMES A DAY AS NEEDED FOR ANXIETY  Other orders -     ramipril (ALTACE) 10 MG capsule; Take 1 capsule (10 mg total) by mouth daily.   Discussed tapering off of xanax.     I  have discontinued Lacey Christensen's irbesartan. I am also having her start on ramipril. Additionally, I am having her maintain her Calcium Carbonate-Vitamin D (CALCARB 600/D PO), diphenhydrAMINE, latanoprost, Krill Oil, Vitamin D, Aspirin-Acetaminophen-Caffeine (GOODY HEADACHE PO), multivitamin, esomeprazole, verapamil, and ALPRAZolam.  Allergies as of 10/25/2022       Reactions   Corn-containing Products Itching   Meloxicam Diarrhea   Sulfa Antibiotics Other (See Comments)   Childhood reaction-unknown   Timolol Other (See Comments)   depression   Tape Rash        Medication List        Accurate as of October 25, 2022 11:52 AM. If you have any questions, ask your nurse or doctor.          STOP taking these medications    irbesartan 300 MG tablet Commonly known as: AVAPRO Stopped by: Josepha Barbier       TAKE these medications    ALPRAZolam 0.25 MG tablet Commonly known as: XANAX TAKE 1/2 TABLET 3 TIMES A DAY AS NEEDED FOR ANXIETY   CALCARB 600/D PO Take 1 tablet by mouth every evening.   diphenhydrAMINE 25 mg capsule Commonly known as: BENADRYL Take 25 mg by mouth every 6 (six) hours as needed for itching or allergies.   esomeprazole 20 MG capsule Commonly known as: NEXIUM Take 1 capsule (20 mg total) by mouth daily.   GOODY HEADACHE PO Take 1 packet by mouth daily as needed for headache.   Krill Oil 300 MG Caps Take 2 capsules (600 mg total) by mouth 2 (two) times daily.   latanoprost 0.005 % ophthalmic solution Commonly known as: XALATAN 1 drop.   multivitamin tablet Take 1 tablet by mouth daily.   ramipril 10 MG capsule Commonly known as: ALTACE Take 1 capsule (10 mg total) by mouth daily. Started by: Rosalynn Sergent   verapamil 240 MG CR tablet Commonly known as: CALAN-SR TAKE 1 TABLET BY MOUTH EVERY DAY   Vitamin D 50 MCG (2000 UT) tablet Take 1 tablet (2,000 Units total) by mouth daily.         Follow-up: Return in about 6 months (around  04/27/2023).  Mechele Claude, M.D.

## 2022-11-27 ENCOUNTER — Other Ambulatory Visit: Payer: Self-pay | Admitting: Family Medicine

## 2022-11-27 DIAGNOSIS — I10 Essential (primary) hypertension: Secondary | ICD-10-CM

## 2022-12-02 DIAGNOSIS — H348312 Tributary (branch) retinal vein occlusion, right eye, stable: Secondary | ICD-10-CM | POA: Diagnosis not present

## 2022-12-10 ENCOUNTER — Other Ambulatory Visit: Payer: Self-pay | Admitting: Family Medicine

## 2022-12-10 DIAGNOSIS — F411 Generalized anxiety disorder: Secondary | ICD-10-CM

## 2022-12-23 DIAGNOSIS — H401121 Primary open-angle glaucoma, left eye, mild stage: Secondary | ICD-10-CM | POA: Diagnosis not present

## 2022-12-23 DIAGNOSIS — H401113 Primary open-angle glaucoma, right eye, severe stage: Secondary | ICD-10-CM | POA: Diagnosis not present

## 2023-02-09 ENCOUNTER — Ambulatory Visit: Payer: Medicare Other

## 2023-02-09 VITALS — Ht 64.0 in | Wt 147.0 lb

## 2023-02-09 DIAGNOSIS — Z0000 Encounter for general adult medical examination without abnormal findings: Secondary | ICD-10-CM

## 2023-02-09 DIAGNOSIS — Z780 Asymptomatic menopausal state: Secondary | ICD-10-CM

## 2023-02-09 DIAGNOSIS — Z122 Encounter for screening for malignant neoplasm of respiratory organs: Secondary | ICD-10-CM

## 2023-02-09 NOTE — Progress Notes (Signed)
Subjective:   Lacey Christensen is a 77 y.o. female who presents for Medicare Annual (Subsequent) preventive examination.  Visit Complete: Virtual I connected with  Lacey Christensen on 02/09/23 by a audio enabled telemedicine application and verified that I am speaking with the correct person using two identifiers.  Patient Location: Home  Provider Location: Home Office  I discussed the limitations of evaluation and management by telemedicine. The patient expressed understanding and agreed to proceed.  Vital Signs: Because this visit was a virtual/telehealth visit, some criteria may be missing or patient reported. Any vitals not documented were not able to be obtained and vitals that have been documented are patient reported.  Patient Medicare AWV questionnaire was completed by the patient on 02/09/2023; I have confirmed that all information answered by patient is correct and no changes since this date.  Cardiac Risk Factors include: advanced age (>71men, >10 women);dyslipidemia;smoking/ tobacco exposure     Objective:    Today's Vitals   02/09/23 1129  Weight: 147 lb (66.7 kg)  Height: 5\' 4"  (1.626 m)   Body mass index is 25.23 kg/m.     02/09/2023   11:36 AM 02/02/2022   11:26 AM 01/30/2021    3:50 PM 03/21/2017    2:20 PM 03/21/2017    9:35 AM 09/23/2014    8:39 AM 06/21/2014    6:08 PM  Advanced Directives  Does Patient Have a Medical Advance Directive? Yes Yes Yes Yes Yes No No  Type of Estate agent of [place];Living will Healthcare Power of [employer] of [place];Living will Healthcare Power of [employer] Power of Attorney    Does patient want to make changes to medical advance directive?  No - Patient declined  No - Patient declined     Copy of Healthcare Power of Attorney in Chart? No - copy requested No - copy requested No - copy requested No - copy requested     Would patient like information on creating a medical advance  directive?      No - patient declined information No - patient declined information    Current Medications (verified) Outpatient Encounter Medications as of 02/09/2023  Medication Sig   ALPRAZolam (XANAX) 0.25 MG tablet TAKE 1/2 TABLET 3 TIMES A DAY AS NEEDED FOR ANXIETY   Aspirin-Acetaminophen-Caffeine (GOODY HEADACHE PO) Take 1 packet by mouth daily as needed for headache.   Bevacizumab (AVASTIN) 100 MG/4ML SOLN    Calcium Carbonate-Vitamin D (CALCARB 600/D PO) Take 1 tablet by mouth every evening.   Cholecalciferol (VITAMIN D) 2000 units tablet Take 1 tablet (2,000 Units total) by mouth daily.   diphenhydrAMINE (BENADRYL) 25 mg capsule Take 25 mg by mouth every 6 (six) hours as needed for itching or allergies.   dorzolamide (TRUSOPT) 2 % ophthalmic solution Place 1 drop into the right eye 2 (two) times daily.   esomeprazole (NEXIUM) 20 MG capsule Take 1 capsule (20 mg total) by mouth daily.   Krill Oil 300 MG CAPS Take 2 capsules (600 mg total) by mouth 2 (two) times daily.   latanoprost (XALATAN) 0.005 % ophthalmic solution 1 drop.   Multiple Vitamin (MULTIVITAMIN) tablet Take 1 tablet by mouth daily.   ramipril (ALTACE) 10 MG capsule Take 1 capsule (10 mg total) by mouth daily.   verapamil (CALAN-SR) 240 MG CR tablet TAKE 1 TABLET BY MOUTH EVERY DAY   No facility-administered encounter medications on file as of 02/09/2023.    Allergies (verified) Corn-containing products, Meloxicam, Sulfa antibiotics, Timolol,  and Tape   History: Past Medical History:  Diagnosis Date   Cataract    Hypertension    Narrow angle glaucoma suspect of both eyes    PUD (peptic ulcer disease)    12 years ago, EGD at [facility] in [facility]   Retinal vessel occlusion    Past Surgical History:  Procedure Laterality Date   COLONOSCOPY N/A 10/24/2014   SLF: 1. Angulated rectosigmoid junction 2. Two rectal polyps removed 3. Small internal hemorrhoids    dilitation and curritage      ESOPHAGOGASTRODUODENOSCOPY N/A 10/24/2014   SLF: 1. Schatzki's ring at the gastroesophageal junction 2. Deformed pylous and superficial pyloric ulcer 3. Non-erosive gastritis ( inflammation) was found in the gastric antrum; multiple biopsies were performed. 4. Stricture at the junction of D!/D@   Family History  Problem Relation Age of Onset   Diabetes Mother    Hypertension Mother    Hypertension Father    Colon cancer Neg Hx    Social History   Socioeconomic History   Marital status: Married    Spouse name: Fayrene Fearing   Number of children: 0   Years of education: 12   Highest education level: 12th grade  Occupational History   Occupation: retired  Tobacco Use   Smoking status: Every Day    Current packs/day: 0.50    Average packs/day: 0.5 packs/day for 50.0 years (25.0 ttl pk-yrs)    Types: Cigarettes   Smokeless tobacco: Never  Vaping Use   Vaping status: Never Used  Substance and Sexual Activity   Alcohol use: No    Alcohol/week: 0.0 standard drinks of alcohol   Drug use: Never   Sexual activity: Not Currently    Birth control/protection: Post-menopausal  Other Topics Concern   Not on file  Social History Narrative   One level home with her husband   No children, but very close with nieces and nephews   Lots of family nearby   Very active and independent   Social Determinants of Health   Financial Resource Strain: Low Risk  (02/09/2023)   Overall Financial Resource Strain (CARDIA)    Difficulty of Paying Living Expenses: Not hard at all  Food Insecurity: No Food Insecurity (02/09/2023)   Hunger Vital Sign    Worried About Running Out of Food in the Last Year: Never true    Ran Out of Food in the Last Year: Never true  Transportation Needs: No Transportation Needs (02/09/2023)   PRAPARE - Administrator, Civil Service (Medical): No    Lack of Transportation (Non-Medical): No  Physical Activity: Insufficiently Active (02/09/2023)   Exercise Vital Sign     Days of Exercise per Week: 3 days    Minutes of Exercise per Session: 30 min  Stress: No Stress Concern Present (02/09/2023)   [employer] of Occupational Health - Occupational Stress Questionnaire    Feeling of Stress : Not at all  Social Connections: Socially Integrated (02/09/2023)   Social Connection and Isolation Panel [NHANES]    Frequency of Communication with Friends and Family: More than three times a week    Frequency of Social Gatherings with Friends and Family: More than three times a week    Attends Religious Services: More than 4 times per year    Active Member of [employer] or Organizations: Yes    Attends Banker Meetings: 1 to 4 times per year    Marital Status: Married    Tobacco Counseling Ready to  quit: No Counseling given: Not Answered   Clinical Intake:  Pre-visit preparation completed: Yes  Pain : No/denies pain     Nutritional Risks: None Diabetes: No  How often do you need to have someone help you when you read instructions, pamphlets, or other written materials from your doctor or pharmacy?: 1 - Never  Interpreter Needed?: No  Information entered by :: Renie Ora, LPN   Activities of Daily Living    02/09/2023   11:36 AM  In your present state of health, do you have any difficulty performing the following activities:  Hearing? 0  Vision? 0  Difficulty concentrating or making decisions? 0  Walking or climbing stairs? 0  Dressing or bathing? 0  Doing errands, shopping? 0  Preparing Food and eating ? N  Using the Toilet? N  In the past six months, have you accidently leaked urine? N  Do you have problems with loss of bowel control? N  Managing your Medications? N  Managing your Finances? N  Housekeeping or managing your Housekeeping? N    Patient Care Team: Mechele Claude, MD as PCP - General (Family Medicine) West Bali, MD (Inactive) as Consulting Physician (Gastroenterology) Verline Lema, MD as Referring  Physician (Obstetrics and Gynecology)  Indicate any recent Medical Services you may have received from other than [facility] providers in the past year (date may be approximate).     Assessment:   This is a routine wellness examination for [facility]  Hearing/Vision screen Vision Screening - Comments:: Wears rx glasses - up to date with routine eye exams with  Dr.Bond [facility]   Goals Addressed   None    Depression Screen    02/09/2023   11:35 AM 10/25/2022   11:28 AM 10/25/2022   11:19 AM 04/26/2022   11:09 AM 04/26/2022   10:57 AM 02/02/2022   11:25 AM 10/22/2021    1:56 PM  PHQ 2/9 Scores  PHQ - 2 Score 0 2 0 0 0 0 1  PHQ- 9 Score  3  1   1     Fall Risk    02/09/2023   11:30 AM 10/25/2022   11:28 AM 10/25/2022   11:19 AM 04/26/2022   10:57 AM 02/02/2022   11:30 AM  Fall Risk   Falls in the past year? 0 0 0 0 0  Number falls in past yr: 0      Injury with Fall? 0      Risk for fall due to : No Fall Risks      Follow up Falls prevention discussed    Falls evaluation completed    MEDICARE RISK AT HOME: Medicare Risk at Home Any stairs in or around the home?: Yes If so, are there any without handrails?: No Home free of loose throw rugs in walkways, pet beds, electrical cords, etc?: Yes Adequate lighting in your home to reduce risk of falls?: Yes Life alert?: No Use of a cane, walker or w/c?: No Grab bars in the bathroom?: Yes Shower chair or bench in shower?: No Elevated toilet seat or a handicapped toilet?: No  TIMED UP AND GO:  Was the test performed?  No    Cognitive Function:        02/09/2023   11:36 AM 02/02/2022   11:28 AM 01/30/2021    3:42 PM  6CIT Screen  What Year? 0 points 0 points 0 points  What month? 0 points 0 points 0 points  What time? 0 points 0 points 0 points  Count back from 20 0 points 0 points 0 points  Months in reverse 0 points 0 points 2 points  Repeat phrase 0 points 0 points 0 points  Total Score 0 points 0 points 2 points     Immunizations Immunization History  Administered Date(s) Administered   Influenza-Unspecified 03/04/2011   Moderna SARS-COV2 Booster Vaccination 04/09/2020   Moderna Sars-Covid-2 Vaccination 07/18/2019, 08/15/2019   Pneumococcal Conjugate-13 04/15/2015   Pneumococcal Polysaccharide-23 10/16/2012    TDAP status: Due, Education has been provided regarding the importance of this vaccine. Advised may receive this vaccine at local pharmacy or Health Dept. Aware to provide a copy of the vaccination record if obtained from local pharmacy or Health Dept. Verbalized acceptance and understanding.  Flu Vaccine status: Declined, Education has been provided regarding the importance of this vaccine but patient still declined. Advised may receive this vaccine at local pharmacy or Health Dept. Aware to provide a copy of the vaccination record if obtained from local pharmacy or Health Dept. Verbalized acceptance and understanding.  Pneumococcal vaccine status: Up to date  Covid-19 vaccine status: Completed vaccines  Qualifies for Shingles Vaccine? Yes   Zostavax completed No   Shingrix Completed?: No.    Education has been provided regarding the importance of this vaccine. Patient has been advised to call insurance company to determine out of pocket expense if they have not yet received this vaccine. Advised may also receive vaccine at local pharmacy or Health Dept. Verbalized acceptance and understanding.  Screening Tests Health Maintenance  Topic Date Due   DTaP/Tdap/Td (1 - Tdap) Never done   Lung Cancer Screening  Never done   COVID-19 Vaccine (3 - Moderna risk series) 05/07/2020   INFLUENZA VACCINE  10/28/2022   Zoster Vaccines- Shingrix (1 of 2) 05/12/2023 (Originally 11/08/1964)   Medicare Annual Wellness (AWV)  02/09/2024   Pneumonia Vaccine 23+ Years old  Completed   DEXA SCAN  Completed   Hepatitis C Screening  Completed   HPV VACCINES  Aged Out   Colonoscopy  Discontinued     Health Maintenance  Health Maintenance Due  Topic Date Due   DTaP/Tdap/Td (1 - Tdap) Never done   Lung Cancer Screening  Never done   COVID-19 Vaccine (3 - Moderna risk series) 05/07/2020   INFLUENZA VACCINE  10/28/2022    Colorectal cancer screening: No longer required.   Mammogram status: No longer required due to age .  Bone Density status: Ordered 02/09/2023. Pt provided with contact info and advised to call to schedule appt.  Lung Cancer Screening: (Low Dose CT Chest recommended if Age 3-80 years, 20 pack-year currently smoking OR have quit w/in 15years.) does qualify.   Lung Cancer Screening Referral: 02/09/2023  Additional Screening:  Hepatitis C Screening: does not qualify; Completed 07/05/2017  Vision Screening: Recommended annual ophthalmology exams for early detection of glaucoma and other disorders of the eye. Is the patient up to date with their annual eye exam?  Yes  Who is the provider or what is the name of the office in which the patient attends annual eye exams? Dr.Bond  If pt is not established with a provider, would they like to be referred to a provider to establish care? No .   Dental Screening: Recommended annual dental exams for proper oral hygiene   Community Resource Referral / Chronic Care Management: CRR required this visit?  No   CCM required this visit?  No     Plan:     I have personally reviewed  and noted the following in the patient's chart:   Medical and social history Use of alcohol, tobacco or illicit drugs  Current medications and supplements including opioid prescriptions. Patient is not currently taking opioid prescriptions. Functional ability and status Nutritional status Physical activity Advanced directives List of other physicians Hospitalizations, surgeries, and ER visits in previous 12 months Vitals Screenings to include cognitive, depression, and falls Referrals and appointments  In addition, I have reviewed  and discussed with patient certain preventive protocols, quality metrics, and best practice recommendations. A written personalized care plan for preventive services as well as general preventive health recommendations were provided to patient.     Lorrene Reid, LPN   [id number]/12/9321   After Visit Summary: (MyChart) Due to this being a telephonic visit, the after visit summary with patients personalized plan was offered to patient via MyChart   Nurse Notes: Due Tdap vaccine

## 2023-02-09 NOTE — Patient Instructions (Signed)
Ms. Bazzle , Thank you for taking time to come for your Medicare Wellness Visit. I appreciate your ongoing commitment to your health goals. Please review the following plan we discussed and let me know if I can assist you in the future.   Referrals/Orders/Follow-Ups/Clinician Recommendations: Aim for 30 minutes of exercise or brisk walking, 6-8 glasses of water, and 5 servings of fruits and vegetables each day.   This is a list of the screening recommended for you and due dates:  Health Maintenance  Topic Date Due   DTaP/Tdap/Td vaccine (1 - Tdap) Never done   Screening for Lung Cancer  Never done   COVID-19 Vaccine (3 - Moderna risk series) 05/07/2020   Flu Shot  10/28/2022   Zoster (Shingles) Vaccine (1 of 2) 05/12/2023*   Medicare Annual Wellness Visit  02/09/2024   Pneumonia Vaccine  Completed   DEXA scan (bone density measurement)  Completed   Hepatitis C Screening  Completed   HPV Vaccine  Aged Out   Colon Cancer Screening  Discontinued  *Topic was postponed. The date shown is not the original due date.    Advanced directives: (Copy Requested) Please bring a copy of your health care power of attorney and living will to the office to be added to your chart at your convenience.  Next Medicare Annual Wellness Visit scheduled for next year: Yes  Insert Preventive Care attachment Insert FALL PREVENTION attachment if needed

## 2023-02-10 DIAGNOSIS — H348312 Tributary (branch) retinal vein occlusion, right eye, stable: Secondary | ICD-10-CM | POA: Diagnosis not present

## 2023-02-24 ENCOUNTER — Other Ambulatory Visit: Payer: Self-pay | Admitting: Family Medicine

## 2023-02-24 DIAGNOSIS — I10 Essential (primary) hypertension: Secondary | ICD-10-CM

## 2023-02-28 ENCOUNTER — Ambulatory Visit (INDEPENDENT_AMBULATORY_CARE_PROVIDER_SITE_OTHER): Payer: Medicare Other | Admitting: Nurse Practitioner

## 2023-02-28 ENCOUNTER — Encounter: Payer: Self-pay | Admitting: Nurse Practitioner

## 2023-02-28 VITALS — BP 115/68 | HR 95 | Temp 97.8°F | Ht 64.0 in | Wt 145.6 lb

## 2023-02-28 DIAGNOSIS — J069 Acute upper respiratory infection, unspecified: Secondary | ICD-10-CM | POA: Diagnosis not present

## 2023-02-28 DIAGNOSIS — J029 Acute pharyngitis, unspecified: Secondary | ICD-10-CM | POA: Insufficient documentation

## 2023-02-28 DIAGNOSIS — R051 Acute cough: Secondary | ICD-10-CM | POA: Insufficient documentation

## 2023-02-28 DIAGNOSIS — R0981 Nasal congestion: Secondary | ICD-10-CM | POA: Diagnosis not present

## 2023-02-28 LAB — VERITOR FLU A/B WAIVED
Influenza A: NEGATIVE
Influenza B: NEGATIVE

## 2023-02-28 LAB — RAPID STREP SCREEN (MED CTR MEBANE ONLY): Strep Gp A Ag, IA W/Reflex: NEGATIVE

## 2023-02-28 LAB — CULTURE, GROUP A STREP

## 2023-02-28 MED ORDER — CEFDINIR 300 MG PO CAPS: 300.0000 mg | ORAL_CAPSULE | Freq: Two times a day (BID) | ORAL | 0 refills | Status: AC

## 2023-02-28 NOTE — Progress Notes (Signed)
Acute Office Visit  Subjective:     Patient ID: Lacey Christensen, female    DOB: [birth year], 77 y.o.   MRN: [id number]  Chief Complaint  Patient presents with   Sore Throat    Symptoms started 5 days ago   Fever   Cough    Productive cough    Ear Pain    HPI Dayshawna Christensen is a 77 y.o. female February 27, 2023 for an acute visit complaining of sore throat, fever, cough described as productive of yellow sputum, and fevers up to 101.5 degrees for 2 days, and had OTC ibuprofen . She denies a history of anorexia, chest pain, myalgias, nausea, vomiting, and wheezing and denies a history of asthma. Patient does smoke 1/2 pack of cigarettes daily. POC Flu A & B, Strep negative   Active Ambulatory Problems    Diagnosis Date Noted   Rectal bleeding 10/16/2014   PUD (peptic ulcer disease)    Duodenal stricture 01/24/2015   Hyperlipidemia 03/25/2015   Neck pain 03/25/2015   Essential hypertension 03/25/2015   Generalized anxiety disorder 03/25/2015   Glaucoma 03/25/2015   Inconclusive mammogram due to dense breasts 04/15/2015   Degenerative disc disease, cervical 04/15/2015   GERD (gastroesophageal reflux disease) 03/21/2017   Current every day smoker 03/21/2017   Ischemic colitis (HCC) 04/06/2017   Combined forms of age-related cataract, bilateral 02/15/2016   Uterine prolapse 01/07/2014   Nonrheumatic mitral valve prolapse 05/14/2013   Chest pain 03/12/2014   Enterocele 05/09/2019   Gastrointestinal symptoms 01/30/2021   Midline cystocele 05/09/2019   Post-menopausal bleeding 05/09/2019   Acute cough 02/28/2023   Sore throat 02/28/2023   Nasal congestion 02/28/2023   Upper respiratory tract infection 02/28/2023   Resolved Ambulatory Problems    Diagnosis Date Noted   History of peptic ulcer disease 10/16/2014   Abdominal pain, epigastric 10/16/2014   Diarrhea 10/16/2014   Abnormal weight loss 01/24/2015   Acute colitis 03/21/2017   Past Medical History:  Diagnosis Date    Cataract    Hypertension    Narrow angle glaucoma suspect of both eyes    Retinal vessel occlusion     ROS Negative unless indicated in HPI    Objective:    BP 115/68   Pulse 95   Temp 97.8 F (36.6 C) (Temporal)   Ht 5\' 4"  (1.626 m)   Wt 145 lb 9.6 oz (66 kg)   SpO2 97%   BMI 24.99 kg/m  BP Readings from Last 3 Encounters:  02/28/23 115/68  10/25/22 136/70  04/26/22 (!) 156/82   Wt Readings from Last 3 Encounters:  02/28/23 145 lb 9.6 oz (66 kg)  02/09/23 147 lb (66.7 kg)  10/25/22 146 lb 9.6 oz (66.5 kg)      Physical Exam She appears well, vital signs are as noted. Ears normal.  Throat and pharynx normal.  Neck supple. No adenopathy in the neck. Nose is congested. Sinuses non tender. The chest is clear, without wheezes or rales.  COVID results pending  No results found for any visits on 02/28/23.      Assessment & Plan:  Acute cough -     Novel Coronavirus, NAA ([facility]) -     Veritor Flu A/B Waived -     Cefdinir; Take 1 capsule (300 mg total) by mouth 2 (two) times daily for 5 days.  Dispense: 10 capsule; Refill: 0  Sore throat -     Novel Coronavirus, NAA ([facility]) -     Veritor  Flu A/B Waived -     Rapid Strep Screen ([facility] ONLY) -     Cefdinir; Take 1 capsule (300 mg total) by mouth 2 (two) times daily for 5 days.  Dispense: 10 capsule; Refill: 0  Nasal congestion -     Novel Coronavirus, NAA ([facility]) -     Veritor Flu A/B Waived -     Cefdinir; Take 1 capsule (300 mg total) by mouth 2 (two) times daily for 5 days.  Dispense: 10 capsule; Refill: 0  Upper respiratory tract infection, unspecified type -     Cefdinir; Take 1 capsule (300 mg total) by mouth 2 (two) times daily for 5 days.  Dispense: 10 capsule; Refill: 0  [name] age 25 year old Caucasian female, no acute distress URI with cough congestion: Cefdinir 300 mg twice daily for 5 days; over-the-counter Coricidin to help with the cough Tylenol/ibuprofen for fever Increase  hydration COVID result pending Call or return to clinic prn if these symptoms worsen or fail to improve as anticipated.    The above assessment and management plan was discussed with the patient. The patient verbalized understanding of and has agreed to the management plan. Patient is aware to call the clinic if they develop any new symptoms or if symptoms persist or worsen. Patient is aware when to return to the clinic for a follow-up visit. Patient educated on when it is appropriate to go to the emergency department.    It appears that you have a viral upper respiratory infection (cold).  Cold symptoms can last up to 2 weeks.  I recommend that you only use cold medications that are safe in high blood pressure like Coricidin (generic is fine).  Other cold medications can increase your blood pressure.    - Get plenty of rest and drink plenty of fluids. - Try to breathe moist air. Use a cold mist humidifier. - Consume warm fluids (soup or tea) to provide relief for a stuffy nose and to loosen phlegm. - For nasal stuffiness, try saline nasal spray or a Neti Pot.  Afrin nasal spray can also be used but this product should not be used longer than 3 days or it will cause rebound nasal stuffiness (worsening nasal congestion). - For sore throat pain relief: use chloraseptic spray, suck on throat lozenges, hard candy or popsicles; gargle with warm salt water (1/4 tsp. salt per 8 oz. of water); and eat soft, bland foods. - Eat a well-balanced diet. If you cannot, ensure you are getting enough nutrients by taking a daily multivitamin. - Avoid dairy products, as they can thicken phlegm. - Avoid alcohol, as it impairs your body's immune system.  CONTACT YOUR DOCTOR IF YOU EXPERIENCE ANY OF THE FOLLOWING: - High fever - Ear pain - Sinus-type headache - Unusually severe cold symptoms - Cough that gets worse while other cold symptoms improve - Flare up of any chronic lung problem, such as asthma - Your  symptoms persist longer than 2 weeks   Return if symptoms worsen or fail to improve.  Lacey Christensen, [facility] [address] [place], [place] [address] [phone]   Note: This document was prepared by [employer] voice dictation technology and any errors that results from this process are unintentional.

## 2023-02-28 NOTE — Patient Instructions (Signed)

## 2023-03-01 LAB — NOVEL CORONAVIRUS, NAA: SARS-CoV-2, NAA: NOT DETECTED

## 2023-03-17 DIAGNOSIS — H348312 Tributary (branch) retinal vein occlusion, right eye, stable: Secondary | ICD-10-CM | POA: Diagnosis not present

## 2023-03-28 ENCOUNTER — Telehealth: Payer: Self-pay

## 2023-03-28 ENCOUNTER — Telehealth: Payer: Self-pay | Admitting: Family Medicine

## 2023-03-28 DIAGNOSIS — N644 Mastodynia: Secondary | ICD-10-CM

## 2023-03-28 NOTE — Telephone Encounter (Signed)
Copied from CRM [id number]. Topic: Clinical - Medical Advice >> Mar 28, 2023  1:53 PM Ivette P wrote: Reason for CRM: Patient had a upper respiratory infection and was prescribed medication and now has diarrhea and would like some medical advice. Requesting a call back [phone]

## 2023-03-28 NOTE — Telephone Encounter (Signed)
Returned call and patient states she has already talked to someone.

## 2023-03-28 NOTE — Telephone Encounter (Signed)
Called and answered patients questions.

## 2023-04-14 NOTE — Telephone Encounter (Signed)
Copied from CRM [id number]. Topic: Clinical - Request for Lab/Test Order >> Apr 14, 2023  9:40 AM Thomes Dinning wrote: Reason for CRM: Patient spoke with [facility] who completed her mammogram and they suggest she should have diagnostic imagining and ultra sound. Patient ask for a call back [phone]) to setup scheduling.

## 2023-04-18 ENCOUNTER — Other Ambulatory Visit: Payer: Self-pay | Admitting: Family Medicine

## 2023-04-18 DIAGNOSIS — R928 Other abnormal and inconclusive findings on diagnostic imaging of breast: Secondary | ICD-10-CM

## 2023-04-18 NOTE — Telephone Encounter (Signed)
I have no information at all. [facility] hasn't shared it to care everywhere.  Which breast is involved?  Where would she like to have the test done?

## 2023-04-19 ENCOUNTER — Other Ambulatory Visit: Payer: Self-pay

## 2023-04-19 NOTE — Addendum Note (Signed)
Addended by: Leda Min D on: 04/19/2023 03:19 PM   Modules accepted: Orders

## 2023-04-19 NOTE — Telephone Encounter (Signed)
Patient is due for mammogram her last screening was in 2023 - she is having left breast pain and has history of cyst in the left breast.  Orders for diagnostic bilat and left ultrasound have been placed and will be faxed to [facility] as requested by the patient

## 2023-04-19 NOTE — Telephone Encounter (Signed)
Please contact patient to have questions below answered. Do NOT route to me until you are able to answer them

## 2023-04-28 DIAGNOSIS — H348312 Tributary (branch) retinal vein occlusion, right eye, stable: Secondary | ICD-10-CM | POA: Diagnosis not present

## 2023-04-28 DIAGNOSIS — H401113 Primary open-angle glaucoma, right eye, severe stage: Secondary | ICD-10-CM | POA: Diagnosis not present

## 2023-04-28 DIAGNOSIS — Z961 Presence of intraocular lens: Secondary | ICD-10-CM | POA: Diagnosis not present

## 2023-04-28 DIAGNOSIS — H26493 Other secondary cataract, bilateral: Secondary | ICD-10-CM | POA: Diagnosis not present

## 2023-04-28 DIAGNOSIS — H401121 Primary open-angle glaucoma, left eye, mild stage: Secondary | ICD-10-CM | POA: Diagnosis not present

## 2023-05-02 ENCOUNTER — Ambulatory Visit: Payer: Medicare Other | Admitting: Family Medicine

## 2023-05-02 ENCOUNTER — Other Ambulatory Visit: Payer: Medicare Other

## 2023-05-04 ENCOUNTER — Other Ambulatory Visit: Payer: Self-pay | Admitting: Family Medicine

## 2023-05-04 DIAGNOSIS — F411 Generalized anxiety disorder: Secondary | ICD-10-CM

## 2023-05-04 MED ORDER — ALPRAZOLAM 0.25 MG PO TABS: ORAL_TABLET | ORAL | 0 refills | Status: DC

## 2023-05-04 NOTE — Telephone Encounter (Signed)
 Copied from CRM [id number]. Topic: Clinical - Medication Refill >> May 04, 2023 11:51 AM Arlina R wrote: Most Recent Primary Care Visit:  Provider: [facility], [place]  Department: [facility]  Visit Type: ACUTE  Date: 02/28/2023  Medication:  ALPRAZolam  (XANAX ) 0.25 MG table  Has the patient contacted their pharmacy? Yes (Agent: If no, request that the patient contact the pharmacy for the refill. If patient does not wish to contact the pharmacy document the reason why and proceed with request.) (Agent: If yes, when and what did the pharmacy advise?)  Is this the correct pharmacy for this prescription? Yes If no, delete pharmacy and type the correct one.  This is the patient's preferred pharmacy:  [id number] - [place], [place] - [address] [place] [address] Phone: [phone] Fax: [phone]     Has the prescription been filled recently? Yes  Is the patient out of the medication? Yes  Has the patient been seen for an appointment in the last year OR does the patient have an upcoming appointment? Yes  Can we respond through MyChart? Yes  Agent: Please be advised that Rx refills may take up to 3 business days. We ask that you follow-up with your pharmacy.

## 2023-05-05 ENCOUNTER — Ambulatory Visit: Payer: Medicare Other | Admitting: Family Medicine

## 2023-05-23 DIAGNOSIS — N644 Mastodynia: Secondary | ICD-10-CM | POA: Diagnosis not present

## 2023-05-25 ENCOUNTER — Other Ambulatory Visit: Payer: Self-pay | Admitting: Family Medicine

## 2023-05-25 DIAGNOSIS — H401113 Primary open-angle glaucoma, right eye, severe stage: Secondary | ICD-10-CM | POA: Diagnosis not present

## 2023-05-25 DIAGNOSIS — H401121 Primary open-angle glaucoma, left eye, mild stage: Secondary | ICD-10-CM | POA: Diagnosis not present

## 2023-05-25 DIAGNOSIS — I10 Essential (primary) hypertension: Secondary | ICD-10-CM

## 2023-06-02 ENCOUNTER — Ambulatory Visit: Payer: Medicare Other | Admitting: Family Medicine

## 2023-06-02 ENCOUNTER — Encounter: Payer: Self-pay | Admitting: Family Medicine

## 2023-06-02 ENCOUNTER — Ambulatory Visit (INDEPENDENT_AMBULATORY_CARE_PROVIDER_SITE_OTHER): Payer: Medicare Other

## 2023-06-02 VITALS — BP 121/69 | HR 87 | Temp 97.4°F | Ht 64.0 in | Wt 140.0 lb

## 2023-06-02 DIAGNOSIS — Z780 Asymptomatic menopausal state: Secondary | ICD-10-CM

## 2023-06-02 DIAGNOSIS — F411 Generalized anxiety disorder: Secondary | ICD-10-CM

## 2023-06-02 DIAGNOSIS — E782 Mixed hyperlipidemia: Secondary | ICD-10-CM | POA: Diagnosis not present

## 2023-06-02 DIAGNOSIS — Z79899 Other long term (current) drug therapy: Secondary | ICD-10-CM | POA: Diagnosis not present

## 2023-06-02 DIAGNOSIS — I10 Essential (primary) hypertension: Secondary | ICD-10-CM

## 2023-06-02 MED ORDER — VERAPAMIL HCL ER 240 MG PO TBCR: 240.0000 mg | EXTENDED_RELEASE_TABLET | Freq: Every day | ORAL | 0 refills | Status: DC

## 2023-06-02 MED ORDER — ALPRAZOLAM 0.25 MG PO TABS: ORAL_TABLET | ORAL | 5 refills | Status: DC

## 2023-06-02 NOTE — Progress Notes (Addendum)
 Subjective:  Patient ID: Lacey Christensen, female    DOB: Nov 28, 1945  Age: 78 y.o. MRN: [id number]  CC: Medical Management of Chronic Issues (NO CONCERNS )   HPI Lacey Christensen presents for  presents for  follow-up of hypertension. Patient has no history of headache chest pain or shortness of breath or recent cough. Patient also denies symptoms of TIA such as focal numbness or weakness. Patient denies side effects from medication. States taking it regularly.  Patient in for follow-up of GERD. Currently asymptomatic taking  PPI daily. There is no chest pain or heartburn. No hematemesis and no melena. No dysphagia or choking. Onset is remote. Progression is stable. Complicating factors, none.  Continues to get adequate relief from xanax 0.25 mg TID. Some days only using once or twice.   Elevated cholesterol, not currently treated except with krill oil      06/02/2023   11:16 AM 02/28/2023   10:04 AM 10/25/2022   11:29 AM 04/26/2022   11:10 AM  GAD 7 : Generalized Anxiety Score  Nervous, Anxious, on Edge 1 1 1 1   Control/stop worrying 1 1 1  0  Worry too much - different things 0 1 1 0  Trouble relaxing 1 0 0 1  Restless 0 1 1 0  Easily annoyed or irritable 0 1 1 1   Afraid - awful might happen 0 0 0 0  Total GAD 7 Score 3 5 5 3   Anxiety Difficulty Not difficult at all Somewhat difficult Somewhat difficult Somewhat difficult          06/02/2023   11:16 AM 02/28/2023   10:04 AM 02/09/2023   11:35 AM  Depression screen PHQ 2/9  Decreased Interest 1 1 0  Down, Depressed, Hopeless 0 1 0  PHQ - 2 Score 1 2 0  Altered sleeping 0 0   Tired, decreased energy 1 1   Change in appetite 0 0   Feeling bad or failure about yourself  0 0   Trouble concentrating 0 0   Moving slowly or fidgety/restless 0 0   Suicidal thoughts 0 0   PHQ-9 Score 2 3   Difficult doing work/chores Not difficult at all Somewhat difficult     History Lacey Christensen has a past medical history of Cataract, Hypertension,  Narrow angle glaucoma suspect of both eyes, PUD (peptic ulcer disease), and Retinal vessel occlusion.   She has a past surgical history that includes dilitation and curritage; Colonoscopy (N/A, 10/24/2014); and Esophagogastroduodenoscopy (N/A, 10/24/2014).   Her family history includes Diabetes in her mother; Hypertension in her father and mother.She reports that she has been smoking cigarettes. She has a 25 pack-year smoking history. She has never used smokeless tobacco. She reports that she does not drink alcohol and does not use drugs.    ROS Review of Systems  Constitutional: Negative.   HENT: Negative.    Eyes:  Negative for visual disturbance.  Respiratory:  Negative for shortness of breath.   Cardiovascular:  Negative for chest pain.  Gastrointestinal:  Negative for abdominal pain.  Musculoskeletal:  Negative for arthralgias.    Objective:  BP 121/69   Pulse 87   Temp (!) 97.4 F (36.3 C)   Ht 5\' 4"  (1.626 m)   Wt 140 lb (63.5 kg)   SpO2 97%   BMI 24.03 kg/m   BP Readings from Last 3 Encounters:  06/02/23 121/69  02/28/23 115/68  10/25/22 136/70    Wt Readings from Last 3 Encounters:  06/02/23 140 lb (  63.5 kg)  02/28/23 145 lb 9.6 oz (66 kg)  02/09/23 147 lb (66.7 kg)     Physical Exam Constitutional:      General: She is not in acute distress.    Appearance: She is well-developed.  Cardiovascular:     Rate and Rhythm: Normal rate and regular rhythm.  Pulmonary:     Breath sounds: Normal breath sounds.  Musculoskeletal:        General: Normal range of motion.  Skin:    General: Skin is warm and dry.  Neurological:     Mental Status: She is alert and oriented to person, place, and time.       Assessment & Plan:   Lacey Christensen was seen today for medical management of chronic issues.  Diagnoses and all orders for this visit:  Mixed hyperlipidemia -     Lipid panel  Essential hypertension -     CBC with Differential/Platelet -     CMP14+EGFR -      verapamil (CALAN-SR) 240 MG CR tablet; Take 1 tablet (240 mg total) by mouth daily.  Generalized anxiety disorder -     ALPRAZolam (XANAX) 0.25 MG tablet; TAKE 1/2 TABLET 3 TIMES A DAY AS NEEDED FOR ANXIETY  Controlled substance agreement signed -     ToxASSURE Select 13 (MW), Urine       I have changed Lacey Christensen's verapamil. I am also having her maintain her Calcium Carbonate-Vitamin D (CALCARB 600/D PO), diphenhydrAMINE, latanoprost, Krill Oil, Vitamin D, Aspirin-Acetaminophen-Caffeine (GOODY HEADACHE PO), multivitamin, esomeprazole, ramipril, dorzolamide, Avastin, and ALPRAZolam.  Allergies as of 06/02/2023       Reactions   Corn-containing Products Itching   Meloxicam Diarrhea   Sulfa Antibiotics Other (See Comments)   Childhood reaction-unknown   Timolol Other (See Comments)   depression   Tape Rash        Medication List        Accurate as of June 02, 2023  4:56 PM. If you have any questions, ask your nurse or doctor.          ALPRAZolam 0.25 MG tablet Commonly known as: XANAX TAKE 1/2 TABLET 3 TIMES A DAY AS NEEDED FOR ANXIETY   Avastin 100 MG/4ML Soln Generic drug: Bevacizumab What changed: Another medication with the same name was removed. Continue taking this medication, and follow the directions you see here. Changed by: Delania Ferg   CALCARB 600/D PO Take 1 tablet by mouth every evening.   diphenhydrAMINE 25 mg capsule Commonly known as: BENADRYL Take 25 mg by mouth every 6 (six) hours as needed for itching or allergies.   dorzolamide 2 % ophthalmic solution Commonly known as: TRUSOPT Place 1 drop into the right eye 2 (two) times daily.   esomeprazole 20 MG capsule Commonly known as: NEXIUM Take 1 capsule (20 mg total) by mouth daily.   GOODY HEADACHE PO Take 1 packet by mouth daily as needed for headache.   Krill Oil 300 MG Caps Take 2 capsules (600 mg total) by mouth 2 (two) times daily.   latanoprost 0.005 % ophthalmic  solution Commonly known as: XALATAN 1 drop.   multivitamin tablet Take 1 tablet by mouth daily.   ramipril 10 MG capsule Commonly known as: ALTACE Take 1 capsule (10 mg total) by mouth daily.   verapamil 240 MG CR tablet Commonly known as: CALAN-SR Take 1 tablet (240 mg total) by mouth daily.   Vitamin D 50 MCG (2000 UT) tablet Take 1 tablet (2,000 Units  total) by mouth daily.         Follow-up: Return in about 6 months (around 12/03/2023).  Mechele Claude, M.D.

## 2023-06-03 DIAGNOSIS — Z780 Asymptomatic menopausal state: Secondary | ICD-10-CM | POA: Diagnosis not present

## 2023-06-03 DIAGNOSIS — M810 Age-related osteoporosis without current pathological fracture: Secondary | ICD-10-CM | POA: Diagnosis not present

## 2023-06-03 LAB — CBC WITH DIFFERENTIAL/PLATELET
Basophils Absolute: 0.2 10*3/uL (ref 0.0–0.2)
Basos: 1 %
EOS (ABSOLUTE): 0.4 10*3/uL (ref 0.0–0.4)
Eos: 3 %
Hematocrit: 42.4 % (ref 34.0–46.6)
Hemoglobin: 14.5 g/dL (ref 11.1–15.9)
Immature Grans (Abs): 0 10*3/uL (ref 0.0–0.1)
Immature Granulocytes: 0 %
Lymphocytes Absolute: 4 10*3/uL — ABNORMAL HIGH (ref 0.7–3.1)
Lymphs: 28 %
MCH: 32.1 pg (ref 26.6–33.0)
MCHC: 34.2 g/dL (ref 31.5–35.7)
MCV: 94 fL (ref 79–97)
Monocytes Absolute: 1.2 10*3/uL — ABNORMAL HIGH (ref 0.1–0.9)
Monocytes: 8 %
Neutrophils Absolute: 8.6 10*3/uL — ABNORMAL HIGH (ref 1.4–7.0)
Neutrophils: 60 %
Platelets: 557 10*3/uL — ABNORMAL HIGH (ref 150–450)
RBC: 4.52 x10E6/uL (ref 3.77–5.28)
RDW: 13.2 % (ref 11.7–15.4)
WBC: 14.3 10*3/uL — ABNORMAL HIGH (ref 3.4–10.8)

## 2023-06-03 LAB — LIPID PANEL
Chol/HDL Ratio: 4.5 ratio — ABNORMAL HIGH (ref 0.0–4.4)
Cholesterol, Total: 201 mg/dL — ABNORMAL HIGH (ref 100–199)
HDL: 45 mg/dL (ref >39)
LDL Chol Calc (NIH): 119 mg/dL — ABNORMAL HIGH (ref 0–99)
Triglycerides: 211 mg/dL — ABNORMAL HIGH (ref 0–149)
VLDL Cholesterol Cal: 37 mg/dL (ref 5–40)

## 2023-06-03 LAB — CMP14+EGFR
ALT: 20 IU/L (ref 0–32)
AST: 25 IU/L (ref 0–40)
Albumin: 4.4 g/dL (ref 3.8–4.8)
Alkaline Phosphatase: 89 IU/L (ref 44–121)
BUN/Creatinine Ratio: 14 (ref 12–28)
BUN: 11 mg/dL (ref 8–27)
Bilirubin Total: 0.2 mg/dL (ref 0.0–1.2)
CO2: 22 mmol/L (ref 20–29)
Calcium: 10.7 mg/dL — ABNORMAL HIGH (ref 8.7–10.3)
Chloride: 103 mmol/L (ref 96–106)
Creatinine, Ser: 0.81 mg/dL (ref 0.57–1.00)
Globulin, Total: 3.1 g/dL (ref 1.5–4.5)
Glucose: 118 mg/dL — ABNORMAL HIGH (ref 70–99)
Potassium: 5.5 mmol/L — ABNORMAL HIGH (ref 3.5–5.2)
Sodium: 141 mmol/L (ref 134–144)
Total Protein: 7.5 g/dL (ref 6.0–8.5)
eGFR: 75 mL/min/{1.73_m2} (ref >59)

## 2023-06-06 ENCOUNTER — Ambulatory Visit: Payer: Self-pay | Admitting: Family Medicine

## 2023-06-06 ENCOUNTER — Other Ambulatory Visit: Payer: Self-pay

## 2023-06-06 ENCOUNTER — Telehealth: Payer: Self-pay | Admitting: Family Medicine

## 2023-06-06 DIAGNOSIS — R300 Dysuria: Secondary | ICD-10-CM

## 2023-06-06 LAB — TOXASSURE SELECT 13 (MW), URINE

## 2023-06-06 NOTE — Telephone Encounter (Signed)
 Pt calls in returning a voicemail from [place] CMA about pt's lab results. RN shared with pt results and Dr. Darlyn Read recommendations. Since Dr. Darlyn Read would like pt's K rechecked Monday or Tuesday, RN called the CAL. CAL scheduled pt for a lab draw tomorrow at 1015. Pt agreeable to that plan.  RN asked pt about signs of infection d/t her WBC being elevated. Pt states she sneezes often but that is not new. Pt also reports urinary frequency with urgency "sometimes." No pain with urination. Pt states the problem is intermittent. Some days she goes 3-4x/day and some days she goes 6-7x/day. Pt endorses dark yellow urine but denies any odor. Pt endorses intermittent back pain but states that is a chronic problem.  RN relayed to the CAL pt's urinary symptoms so they can add on a UA. RN advised pt she should call us back with any worsening in the meantime.  Copied from CRM [id number]. Topic: Clinical - Lab/Test Results >> Jun 06, 2023 12:01 PM Turkey B wrote: Reason for CRM: pt called in for lab results Reason for Disposition  Health Information question, no triage required and triager able to answer question  Answer Assessment - Initial Assessment Questions 1. REASON FOR CALL or QUESTION: "What is your reason for calling today?" or "How can I best help you?" or "What question do you have that I can help answer?"     Calling about lab results. RN shared Dr. Darlyn Read notes. RN asked if pt has had signs of infection d/t her elevated WBC. Pt reports she has been sneezing lately but states this is normal for her. Pt also reports urinary frequency and urgency "sometimes." Pt states sometimes she goes 3-4x/day and sometimes she goes 6-7x/day. No pain.  Answer Assessment - Initial Assessment Questions 1. SYMPTOM: "What's the main symptom you're concerned about?" (e.g., frequency, incontinence)     Frequency 2. ONSET: "When did the frequency start?"     Several months - states problem is intermittent 3. PAIN: "Is  there any pain?" If Yes, ask: "How bad is it?" (Scale: 1-10; mild, moderate, severe)     No 4. CAUSE: "What do you think is causing the symptoms?"     Not sure 5. OTHER SYMPTOMS: "Do you have any other symptoms?" (e.g., blood in urine, fever, flank pain, pain with urination)     Urine is "kind of yellow", back pain "off and on" (states that is not new, had that for some time), frequency and urgency "sometimes." No pain with urination. Some days pt urinates 3-4x/day, sometimes 6-7x/day.  Protocols used: Information Only Call - No Triage-A-AH, Urinary Symptoms-A-AH

## 2023-06-07 ENCOUNTER — Encounter: Payer: Self-pay | Admitting: Family Medicine

## 2023-06-07 ENCOUNTER — Other Ambulatory Visit

## 2023-06-07 DIAGNOSIS — R300 Dysuria: Secondary | ICD-10-CM

## 2023-06-07 DIAGNOSIS — E875 Hyperkalemia: Secondary | ICD-10-CM | POA: Diagnosis not present

## 2023-06-07 LAB — URINALYSIS, ROUTINE W REFLEX MICROSCOPIC
Bilirubin, UA: NEGATIVE
Glucose, UA: NEGATIVE
Ketones, UA: NEGATIVE
Leukocytes,UA: NEGATIVE
Nitrite, UA: NEGATIVE
Protein,UA: NEGATIVE
RBC, UA: NEGATIVE
Specific Gravity, UA: 1.015 (ref 1.005–1.030)
Urobilinogen, Ur: 0.2 mg/dL (ref 0.2–1.0)
pH, UA: 7.5 (ref 5.0–7.5)

## 2023-06-07 NOTE — Progress Notes (Signed)
 DEXA shows osteoporosis I recommend weekly fosamax.with daily Vitamin D 2000 units and calcium 500 mg BID Nurse, if pt. Is agreeable, send in Fosamax 70 mg weekly, #13.  Thanks, WS

## 2023-06-08 LAB — CMP14+EGFR
ALT: 17 IU/L (ref 0–32)
AST: 21 IU/L (ref 0–40)
Albumin: 4.4 g/dL (ref 3.8–4.8)
Alkaline Phosphatase: 83 IU/L (ref 44–121)
BUN/Creatinine Ratio: 10 — ABNORMAL LOW (ref 12–28)
BUN: 8 mg/dL (ref 8–27)
Bilirubin Total: 0.3 mg/dL (ref 0.0–1.2)
CO2: 21 mmol/L (ref 20–29)
Calcium: 10.7 mg/dL — ABNORMAL HIGH (ref 8.7–10.3)
Chloride: 102 mmol/L (ref 96–106)
Creatinine, Ser: 0.8 mg/dL (ref 0.57–1.00)
Globulin, Total: 3 g/dL (ref 1.5–4.5)
Glucose: 102 mg/dL — ABNORMAL HIGH (ref 70–99)
Potassium: 4.7 mmol/L (ref 3.5–5.2)
Sodium: 140 mmol/L (ref 134–144)
Total Protein: 7.4 g/dL (ref 6.0–8.5)
eGFR: 76 mL/min/{1.73_m2} (ref >59)

## 2023-06-10 LAB — URINE CULTURE

## 2023-06-11 ENCOUNTER — Encounter: Payer: Self-pay | Admitting: Family Medicine

## 2023-06-11 NOTE — Progress Notes (Signed)
 Hello Alletta,  Your lab result is normal and/or stable.Some minor variations that are not significant are commonly marked abnormal, but do not represent any medical problem for you.  Best regards, Mechele Claude, M.D.

## 2023-06-13 ENCOUNTER — Telehealth: Payer: Self-pay | Admitting: Family Medicine

## 2023-06-13 NOTE — Telephone Encounter (Signed)
 If she has having overt symptoms of infection, other than urinary, we can treat that.  However the mild increase represents what we call demargination which is a harmless process based on stress to the body.

## 2023-06-13 NOTE — Telephone Encounter (Unsigned)
 Copied from CRM [id number]. Topic: Clinical - Lab/Test Results >> Jun 13, 2023  1:24 PM Lacey Christensen wrote: Reason for CRM: calling about labs, read results, wants to be contacted once the urinalysis is in

## 2023-06-14 NOTE — Telephone Encounter (Signed)
 Pt made aware. No symptoms or concerns at this time. LS

## 2023-06-16 DIAGNOSIS — H34831 Tributary (branch) retinal vein occlusion, right eye, with macular edema: Secondary | ICD-10-CM | POA: Diagnosis not present

## 2023-06-21 ENCOUNTER — Encounter: Payer: Self-pay | Admitting: Family Medicine

## 2023-07-02 ENCOUNTER — Other Ambulatory Visit: Payer: Self-pay | Admitting: Family Medicine

## 2023-07-02 DIAGNOSIS — F411 Generalized anxiety disorder: Secondary | ICD-10-CM

## 2023-07-28 DIAGNOSIS — H348312 Tributary (branch) retinal vein occlusion, right eye, stable: Secondary | ICD-10-CM | POA: Diagnosis not present

## 2023-09-08 DIAGNOSIS — H26493 Other secondary cataract, bilateral: Secondary | ICD-10-CM | POA: Diagnosis not present

## 2023-09-08 DIAGNOSIS — H348312 Tributary (branch) retinal vein occlusion, right eye, stable: Secondary | ICD-10-CM | POA: Diagnosis not present

## 2023-09-08 DIAGNOSIS — H401121 Primary open-angle glaucoma, left eye, mild stage: Secondary | ICD-10-CM | POA: Diagnosis not present

## 2023-09-08 DIAGNOSIS — Z961 Presence of intraocular lens: Secondary | ICD-10-CM | POA: Diagnosis not present

## 2023-09-08 DIAGNOSIS — H401113 Primary open-angle glaucoma, right eye, severe stage: Secondary | ICD-10-CM | POA: Diagnosis not present

## 2023-09-22 DIAGNOSIS — H401113 Primary open-angle glaucoma, right eye, severe stage: Secondary | ICD-10-CM | POA: Diagnosis not present

## 2023-09-22 DIAGNOSIS — H401121 Primary open-angle glaucoma, left eye, mild stage: Secondary | ICD-10-CM | POA: Diagnosis not present

## 2023-10-13 DIAGNOSIS — H348312 Tributary (branch) retinal vein occlusion, right eye, stable: Secondary | ICD-10-CM | POA: Diagnosis not present

## 2023-10-20 ENCOUNTER — Other Ambulatory Visit: Payer: Self-pay | Admitting: Family Medicine

## 2023-11-07 DIAGNOSIS — H401121 Primary open-angle glaucoma, left eye, mild stage: Secondary | ICD-10-CM | POA: Diagnosis not present

## 2023-11-07 DIAGNOSIS — H401113 Primary open-angle glaucoma, right eye, severe stage: Secondary | ICD-10-CM | POA: Diagnosis not present

## 2023-11-15 ENCOUNTER — Other Ambulatory Visit: Payer: Self-pay | Admitting: Family Medicine

## 2023-11-15 DIAGNOSIS — I10 Essential (primary) hypertension: Secondary | ICD-10-CM

## 2023-11-17 DIAGNOSIS — H348312 Tributary (branch) retinal vein occlusion, right eye, stable: Secondary | ICD-10-CM | POA: Diagnosis not present

## 2023-12-06 ENCOUNTER — Ambulatory Visit (INDEPENDENT_AMBULATORY_CARE_PROVIDER_SITE_OTHER): Admitting: Family Medicine

## 2023-12-06 ENCOUNTER — Encounter: Payer: Self-pay | Admitting: Family Medicine

## 2023-12-06 VITALS — BP 135/74 | HR 72 | Temp 98.0°F | Ht 64.0 in | Wt 141.0 lb

## 2023-12-06 DIAGNOSIS — E782 Mixed hyperlipidemia: Secondary | ICD-10-CM

## 2023-12-06 DIAGNOSIS — K219 Gastro-esophageal reflux disease without esophagitis: Secondary | ICD-10-CM

## 2023-12-06 DIAGNOSIS — F411 Generalized anxiety disorder: Secondary | ICD-10-CM

## 2023-12-06 DIAGNOSIS — I10 Essential (primary) hypertension: Secondary | ICD-10-CM | POA: Diagnosis not present

## 2023-12-06 LAB — LIPID PANEL

## 2023-12-06 MED ORDER — DULOXETINE HCL 30 MG PO CPEP: 30.0000 mg | ORAL_CAPSULE | Freq: Every day | ORAL | 0 refills | Status: DC
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
## 2023-12-07 LAB — CBC WITH DIFFERENTIAL/PLATELET
Basophils Absolute: 0.2 x10E3/uL (ref 0.0–0.2)
Basos: 1 %
EOS (ABSOLUTE): 0.6 x10E3/uL — ABNORMAL HIGH (ref 0.0–0.4)
Eos: 4 %
Hematocrit: 43.4 % (ref 34.0–46.6)
Hemoglobin: 13.9 g/dL (ref 11.1–15.9)
Immature Grans (Abs): 0 x10E3/uL (ref 0.0–0.1)
Immature Granulocytes: 0 %
Lymphocytes Absolute: 3.9 x10E3/uL — ABNORMAL HIGH (ref 0.7–3.1)
Lymphs: 27 %
MCH: 31.1 pg (ref 26.6–33.0)
MCHC: 32 g/dL (ref 31.5–35.7)
MCV: 97 fL (ref 79–97)
Monocytes Absolute: 1.1 x10E3/uL — ABNORMAL HIGH (ref 0.1–0.9)
Monocytes: 8 %
Neutrophils Absolute: 8.4 x10E3/uL — ABNORMAL HIGH (ref 1.4–7.0)
Neutrophils: 60 %
Platelets: 510 x10E3/uL — ABNORMAL HIGH (ref 150–450)
RBC: 4.47 x10E6/uL (ref 3.77–5.28)
RDW: 13.4 % (ref 11.7–15.4)
WBC: 14.3 x10E3/uL — ABNORMAL HIGH (ref 3.4–10.8)

## 2023-12-07 LAB — CMP14+EGFR
ALT: 15 IU/L (ref 0–32)
AST: 20 IU/L (ref 0–40)
Albumin: 4.4 g/dL (ref 3.8–4.8)
Alkaline Phosphatase: 76 IU/L (ref 44–121)
BUN/Creatinine Ratio: 16 (ref 12–28)
BUN: 12 mg/dL (ref 8–27)
Bilirubin Total: 0.3 mg/dL (ref 0.0–1.2)
CO2: 23 mmol/L (ref 20–29)
Calcium: 10.7 mg/dL — AB (ref 8.7–10.3)
Chloride: 100 mmol/L (ref 96–106)
Creatinine, Ser: 0.73 mg/dL (ref 0.57–1.00)
Globulin, Total: 2.9 g/dL (ref 1.5–4.5)
Glucose: 110 mg/dL — AB (ref 70–99)
Potassium: 4.8 mmol/L (ref 3.5–5.2)
Sodium: 137 mmol/L (ref 134–144)
Total Protein: 7.3 g/dL (ref 6.0–8.5)
eGFR: 84 mL/min/1.73 (ref >59)

## 2023-12-07 LAB — LIPID PANEL
Cholesterol, Total: 206 mg/dL — AB (ref 100–199)
HDL: 40 mg/dL (ref >39)
LDL CALC COMMENT:: 5.2 ratio — AB (ref 0.0–4.4)
LDL Chol Calc (NIH): 120 mg/dL — AB (ref 0–99)
Triglycerides: 265 mg/dL — AB (ref 0–149)
VLDL Cholesterol Cal: 46 mg/dL — AB (ref 5–40)

## 2023-12-11 ENCOUNTER — Ambulatory Visit: Payer: Self-pay | Admitting: Family Medicine

## 2023-12-11 DIAGNOSIS — I10 Essential (primary) hypertension: Secondary | ICD-10-CM

## 2023-12-11 DIAGNOSIS — F411 Generalized anxiety disorder: Secondary | ICD-10-CM

## 2023-12-13 ENCOUNTER — Other Ambulatory Visit: Payer: Self-pay | Admitting: Family Medicine

## 2023-12-13 DIAGNOSIS — I10 Essential (primary) hypertension: Secondary | ICD-10-CM

## 2023-12-13 DIAGNOSIS — F411 Generalized anxiety disorder: Secondary | ICD-10-CM

## 2023-12-13 MED ORDER — ALPRAZOLAM 0.25 MG PO TABS: ORAL_TABLET | ORAL | 5 refills | Status: AC

## 2023-12-13 MED ORDER — VERAPAMIL HCL ER 240 MG PO TBCR: 240.0000 mg | EXTENDED_RELEASE_TABLET | Freq: Every day | ORAL | 0 refills | Status: AC

## 2023-12-13 MED ORDER — RAMIPRIL 10 MG PO CAPS: 10.0000 mg | ORAL_CAPSULE | Freq: Every day | ORAL | 3 refills | Status: AC

## 2024-01-05 ENCOUNTER — Ambulatory Visit: Admitting: Family Medicine

## 2024-01-10 ENCOUNTER — Ambulatory Visit (INDEPENDENT_AMBULATORY_CARE_PROVIDER_SITE_OTHER): Admitting: Family Medicine

## 2024-01-10 ENCOUNTER — Encounter: Payer: Self-pay | Admitting: Family Medicine

## 2024-01-10 VITALS — BP 144/78 | HR 80 | Temp 98.0°F | Ht 64.0 in | Wt 139.6 lb

## 2024-01-10 DIAGNOSIS — F411 Generalized anxiety disorder: Secondary | ICD-10-CM | POA: Diagnosis not present

## 2024-01-10 DIAGNOSIS — K219 Gastro-esophageal reflux disease without esophagitis: Secondary | ICD-10-CM | POA: Diagnosis not present

## 2024-01-10 DIAGNOSIS — I10 Essential (primary) hypertension: Secondary | ICD-10-CM

## 2024-01-10 DIAGNOSIS — J301 Allergic rhinitis due to pollen: Secondary | ICD-10-CM

## 2024-01-10 NOTE — Progress Notes (Signed)
 Subjective:  Patient ID: Lacey Christensen, female    DOB: [birth year]  Age: 78 y.o. MRN: [id number]  CC: Medical Management of Chronic Issues (1 month follow up)   HPI  Discussed the use of AI scribe software for clinical note transcription with the patient, who gave verbal consent to proceed.  History of Present Illness Lacey Christensen is a 78 year old female who presents for a follow-up visit after experiencing depressive symptoms.  She feels considerably better since her last visit. She did not start taking Cymbalta , opting instead to increase her activity level and focus on self-care. Engaging in activities like shopping and dining out has improved her mood, despite not particularly enjoying these outings initially. She describes this change as a 'light switch' effect, feeling much better overall.  She has noticed thinning hair, which she associates with the time she began receiving Avastin  injections for her eye condition. She inquires if this could be a side effect of the medication.  She is currently taking ramipril  and verapamil  for blood pressure management and esomeprazole  for stomach issues. She reports occasional foot swelling, particularly in the summer. Her blood pressure readings are typically around 130/78, but she notes a higher reading today, possibly due to a longer wait time before the appointment. Her blood pressure is regularly checked at her eye doctor's appointments every six weeks.  She has been experiencing allergy symptoms, particularly since the onset of fall, and attributes this to ragweed. She manages her symptoms with Benadryl . She has a history of taking allergy shots for several years but discontinued them.  She mentions a recent viral illness that affected both her and her husband, with her experiencing mild symptoms such as a low-grade fever and congestion.          01/10/2024   11:05 AM 12/06/2023   10:23 AM 06/02/2023   11:16 AM  Depression screen PHQ 2/9   Decreased Interest 1 1 1   Down, Depressed, Hopeless 0 1 0  PHQ - 2 Score 1 2 1   Altered sleeping 0 1 0  Tired, decreased energy 1 1 1   Change in appetite 0 1 0  Feeling bad or failure about yourself  0 0 0  Trouble concentrating 0 0 0  Moving slowly or fidgety/restless 0 0 0  Suicidal thoughts 0 0 0  PHQ-9 Score 2 5 2   Difficult doing work/chores Somewhat difficult Not difficult at all Not difficult at all    History Lacey Christensen has a past medical history of Cataract, Hypertension, Narrow angle glaucoma suspect of both eyes, PUD (peptic ulcer disease), and Retinal vessel occlusion.   She has a past surgical history that includes dilitation and curritage; Colonoscopy (N/A, 10/24/2014); and Esophagogastroduodenoscopy (N/A, 10/24/2014).   Her family history includes Diabetes in her mother; Hypertension in her father and mother.She reports that she has been smoking cigarettes. She has a 25 pack-year smoking history. She has never used smokeless tobacco. She reports that she does not drink alcohol and does not use drugs.    ROS Review of Systems  Constitutional: Negative.   HENT:  Negative for congestion.   Eyes:  Negative for visual disturbance.  Respiratory:  Negative for shortness of breath.   Cardiovascular:  Negative for chest pain.  Gastrointestinal:  Negative for abdominal pain, constipation, diarrhea, nausea and vomiting.  Genitourinary:  Negative for difficulty urinating.  Musculoskeletal:  Negative for arthralgias and myalgias.  Allergic/Immunologic: Positive for immunocompromised state.  Neurological:  Negative for headaches.  Psychiatric/Behavioral:  Negative  for sleep disturbance.     Objective:  BP (!) 144/78   Pulse 80   Temp 98 F (36.7 C)   Ht 5' 4 (1.626 m)   Wt 139 lb 9.6 oz (63.3 kg)   SpO2 97%   BMI 23.96 kg/m   BP Readings from Last 3 Encounters:  01/10/24 (!) 144/78  12/06/23 135/74  06/02/23 121/69    Wt Readings from Last 3 Encounters:  01/10/24  139 lb 9.6 oz (63.3 kg)  12/06/23 141 lb (64 kg)  06/02/23 140 lb (63.5 kg)     Physical Exam Physical Exam VITALS: T- 99.2, BP- 144/77 GENERAL: Alert, cooperative, well developed, no acute distress HEENT: Normocephalic, normal oropharynx, moist mucous membranes CHEST: Clear to auscultation bilaterally, no wheezes, rhonchi, or crackles CARDIOVASCULAR: Normal heart rate and rhythm, S1 and S2 normal without murmurs ABDOMEN: Soft, non-tender, non-distended, without organomegaly, normal bowel sounds EXTREMITIES: No cyanosis or edema NEUROLOGICAL: Cranial nerves grossly intact, moves all extremities without gross motor or sensory deficit   Assessment & Plan:  Essential hypertension  Generalized anxiety disorder  Seasonal allergic rhinitis due to pollen  Gastroesophageal reflux disease, unspecified whether esophagitis present    Assessment and Plan Assessment & Plan  Routine follow-up shows slightly elevated blood pressure at 144/77, which is acceptable for her age. No new issues reported and previous blood work was satisfactory. Continue current medications and lifestyle modifications. Schedule the next wellness visit in six months unless new issues arise.  Essential hypertension   Blood pressure today is 144/77, slightly elevated but acceptable for her age, typically around 51/78. Continue current antihypertensive medications, Ramipril  and Verapamil , and monitor blood pressure regularly.  Depression and anxiety   Her mood and activity levels have significantly improved without duloxetine . Increased social activities and self-care have contributed to this improvement. She prefers to avoid medication unless symptoms worsen. Remove duloxetine  from the medication list but keep it on file with the pharmacist for future use if needed. Encourage continued engagement in social activities and self-care.  Allergic rhinitis   Experiencing allergy symptoms likely due to ragweed, currently  using Benadryl  as needed. Consider starting Zyrtec or Xyzal for daily allergy management and continue using Benadryl  as needed for acute symptoms.       Follow-up: Return in about 6 months (around 07/10/2024) for hypertension, Anxiety.  Butler Der, M.D.

## 2024-01-12 DIAGNOSIS — H348312 Tributary (branch) retinal vein occlusion, right eye, stable: Secondary | ICD-10-CM | POA: Diagnosis not present

## 2024-01-12 DIAGNOSIS — H3589 Other specified retinal disorders: Secondary | ICD-10-CM | POA: Diagnosis not present

## 2024-01-30 DIAGNOSIS — H401121 Primary open-angle glaucoma, left eye, mild stage: Secondary | ICD-10-CM | POA: Diagnosis not present

## 2024-01-30 DIAGNOSIS — H401113 Primary open-angle glaucoma, right eye, severe stage: Secondary | ICD-10-CM | POA: Diagnosis not present

## 2024-03-08 DIAGNOSIS — H348312 Tributary (branch) retinal vein occlusion, right eye, stable: Secondary | ICD-10-CM | POA: Diagnosis not present

## 2024-03-16 NOTE — Telephone Encounter (Signed)
 Type of request: Is this prior authorization request for initial or ongoing therapy? Continuation  Medication: [id number] Eylea (aflibercept) for OD   Date of scheduled injection: 05/10/2024  Patient Diagnosis/ICD10 Code:  Y65.1687  Ordering Provider: Lorrene Pugh  Place of service preference: [facility]

## 2024-07-09 ENCOUNTER — Ambulatory Visit: Admitting: Family Medicine
